# Patient Record
Sex: Male | Born: 1955 | Race: White | Hispanic: No | Smoking: Former smoker
Health system: Southern US, Community
[De-identification: ages and names within clinical notes are randomized; demographics above are authoritative.]

## PROBLEM LIST (undated history)

## (undated) DIAGNOSIS — K760 Fatty (change of) liver, not elsewhere classified: Secondary | ICD-10-CM

## (undated) DIAGNOSIS — K219 Gastro-esophageal reflux disease without esophagitis: Secondary | ICD-10-CM

## (undated) DIAGNOSIS — I495 Sick sinus syndrome: Secondary | ICD-10-CM

## (undated) DIAGNOSIS — E538 Deficiency of other specified B group vitamins: Secondary | ICD-10-CM

## (undated) DIAGNOSIS — M25569 Pain in unspecified knee: Secondary | ICD-10-CM

## (undated) DIAGNOSIS — I10 Essential (primary) hypertension: Secondary | ICD-10-CM

## (undated) DIAGNOSIS — E785 Hyperlipidemia, unspecified: Secondary | ICD-10-CM

## (undated) DIAGNOSIS — D126 Benign neoplasm of colon, unspecified: Secondary | ICD-10-CM

## (undated) DIAGNOSIS — Z87442 Personal history of urinary calculi: Secondary | ICD-10-CM

## (undated) DIAGNOSIS — K439 Ventral hernia without obstruction or gangrene: Secondary | ICD-10-CM

## (undated) DIAGNOSIS — K648 Other hemorrhoids: Secondary | ICD-10-CM

## (undated) DIAGNOSIS — Z950 Presence of cardiac pacemaker: Secondary | ICD-10-CM

## (undated) DIAGNOSIS — K5090 Crohn's disease, unspecified, without complications: Secondary | ICD-10-CM

## (undated) DIAGNOSIS — K5790 Diverticulosis of intestine, part unspecified, without perforation or abscess without bleeding: Secondary | ICD-10-CM

## (undated) DIAGNOSIS — I4430 Unspecified atrioventricular block: Secondary | ICD-10-CM

## (undated) DIAGNOSIS — E291 Testicular hypofunction: Secondary | ICD-10-CM

## (undated) DIAGNOSIS — J189 Pneumonia, unspecified organism: Secondary | ICD-10-CM

## (undated) DIAGNOSIS — M1990 Unspecified osteoarthritis, unspecified site: Secondary | ICD-10-CM

## (undated) HISTORY — PX: APPENDECTOMY: SHX54

## (undated) HISTORY — PX: CATARACT EXTRACTION: SUR2

## (undated) HISTORY — DX: Testicular hypofunction: E29.1

## (undated) HISTORY — DX: Diverticulosis of intestine, part unspecified, without perforation or abscess without bleeding: K57.90

## (undated) HISTORY — DX: Ventral hernia without obstruction or gangrene: K43.9

## (undated) HISTORY — DX: Pain in unspecified knee: M25.569

## (undated) HISTORY — DX: Benign neoplasm of colon, unspecified: D12.6

## (undated) HISTORY — DX: Deficiency of other specified B group vitamins: E53.8

## (undated) HISTORY — DX: Gastro-esophageal reflux disease without esophagitis: K21.9

## (undated) HISTORY — DX: Hyperlipidemia, unspecified: E78.5

## (undated) HISTORY — DX: Fatty (change of) liver, not elsewhere classified: K76.0

## (undated) HISTORY — DX: Other hemorrhoids: K64.8

## (undated) HISTORY — DX: Essential (primary) hypertension: I10

---

## 1994-01-08 HISTORY — PX: OTHER SURGICAL HISTORY: SHX169

## 2000-11-21 HISTORY — PX: OTHER SURGICAL HISTORY: SHX169

## 2001-08-25 ENCOUNTER — Encounter: Payer: Self-pay | Admitting: Endocrinology

## 2001-08-25 ENCOUNTER — Encounter: Admission: RE | Admit: 2001-08-25 | Discharge: 2001-08-25 | Payer: Self-pay | Admitting: Endocrinology

## 2002-10-08 ENCOUNTER — Encounter: Payer: Self-pay | Admitting: Endocrinology

## 2002-10-08 ENCOUNTER — Ambulatory Visit (HOSPITAL_COMMUNITY): Admission: RE | Admit: 2002-10-08 | Discharge: 2002-10-08 | Payer: Self-pay | Admitting: Endocrinology

## 2003-11-25 ENCOUNTER — Ambulatory Visit: Payer: Self-pay | Admitting: Endocrinology

## 2003-12-01 ENCOUNTER — Ambulatory Visit: Payer: Self-pay | Admitting: Endocrinology

## 2004-03-24 ENCOUNTER — Ambulatory Visit: Payer: Self-pay | Admitting: Endocrinology

## 2004-04-03 ENCOUNTER — Ambulatory Visit: Payer: Self-pay | Admitting: Internal Medicine

## 2004-04-05 ENCOUNTER — Ambulatory Visit (HOSPITAL_COMMUNITY): Admission: RE | Admit: 2004-04-05 | Discharge: 2004-04-05 | Payer: Self-pay | Admitting: Internal Medicine

## 2004-04-24 ENCOUNTER — Ambulatory Visit: Payer: Self-pay | Admitting: Endocrinology

## 2004-05-04 ENCOUNTER — Ambulatory Visit: Payer: Self-pay | Admitting: Endocrinology

## 2004-10-13 ENCOUNTER — Ambulatory Visit: Payer: Self-pay | Admitting: Endocrinology

## 2004-12-12 ENCOUNTER — Ambulatory Visit: Payer: Self-pay | Admitting: Endocrinology

## 2005-05-09 ENCOUNTER — Ambulatory Visit: Payer: Self-pay | Admitting: Endocrinology

## 2005-05-14 ENCOUNTER — Ambulatory Visit: Payer: Self-pay | Admitting: Endocrinology

## 2005-06-28 ENCOUNTER — Ambulatory Visit: Payer: Self-pay | Admitting: Endocrinology

## 2005-07-12 ENCOUNTER — Ambulatory Visit: Payer: Self-pay | Admitting: Endocrinology

## 2006-02-01 HISTORY — PX: ELECTROCARDIOGRAM: SHX264

## 2006-07-22 ENCOUNTER — Ambulatory Visit: Payer: Self-pay | Admitting: Endocrinology

## 2006-07-22 LAB — CONVERTED CEMR LAB
ALT: 24 units/L (ref 0–53)
AST: 28 units/L (ref 0–37)
Albumin: 3.8 g/dL (ref 3.5–5.2)
Alkaline Phosphatase: 60 units/L (ref 39–117)
BUN: 11 mg/dL (ref 6–23)
Basophils Absolute: 0.2 10*3/uL — ABNORMAL HIGH (ref 0.0–0.1)
Basophils Relative: 2.5 % — ABNORMAL HIGH (ref 0.0–1.0)
Bilirubin Urine: NEGATIVE
Bilirubin, Direct: 0.1 mg/dL (ref 0.0–0.3)
CO2: 33 meq/L — ABNORMAL HIGH (ref 19–32)
Calcium: 9.9 mg/dL (ref 8.4–10.5)
Chloride: 98 meq/L (ref 96–112)
Cholesterol: 125 mg/dL (ref 0–200)
Creatinine, Ser: 1 mg/dL (ref 0.4–1.5)
Eosinophils Absolute: 0.2 10*3/uL (ref 0.0–0.6)
Eosinophils Relative: 2.1 % (ref 0.0–5.0)
GFR calc Af Amer: 101 mL/min
GFR calc non Af Amer: 84 mL/min
Glucose, Bld: 124 mg/dL — ABNORMAL HIGH (ref 70–99)
HCT: 48.7 % (ref 39.0–52.0)
HDL: 33.5 mg/dL — ABNORMAL LOW (ref >39.0)
Hemoglobin, Urine: NEGATIVE
Hemoglobin: 16.1 g/dL (ref 13.0–17.0)
Hgb A1c MFr Bld: 5.8 % (ref 4.6–6.0)
Ketones, ur: NEGATIVE mg/dL
LDL Cholesterol: 72 mg/dL (ref 0–99)
Leukocytes, UA: NEGATIVE
Lymphocytes Relative: 22.3 % (ref 12.0–46.0)
MCHC: 33.1 g/dL (ref 30.0–36.0)
MCV: 89.2 fL (ref 78.0–100.0)
Monocytes Absolute: 0.4 10*3/uL (ref 0.2–0.7)
Monocytes Relative: 5 % (ref 3.0–11.0)
Neutro Abs: 4.8 10*3/uL (ref 1.4–7.7)
Neutrophils Relative %: 68.1 % (ref 43.0–77.0)
Nitrite: NEGATIVE
PSA: 1.93 ng/mL (ref 0.10–4.00)
Platelets: 232 10*3/uL (ref 150–400)
Potassium: 4.6 meq/L (ref 3.5–5.1)
RBC: 5.46 M/uL (ref 4.22–5.81)
RDW: 13 % (ref 11.5–14.6)
Sodium: 137 meq/L (ref 135–145)
Specific Gravity, Urine: 1.025 (ref 1.000–1.03)
TSH: 1.04 microintl units/mL (ref 0.35–5.50)
Testosterone: 422.45 ng/dL (ref 350.00–890)
Total Bilirubin: 0.8 mg/dL (ref 0.3–1.2)
Total CHOL/HDL Ratio: 3.7
Total Protein, Urine: NEGATIVE mg/dL
Total Protein: 7 g/dL (ref 6.0–8.3)
Triglycerides: 100 mg/dL (ref 0–149)
Urine Glucose: NEGATIVE mg/dL
Urobilinogen, UA: 0.2 (ref 0.0–1.0)
VLDL: 20 mg/dL (ref 0–40)
WBC: 7.2 10*3/uL (ref 4.5–10.5)
pH: 5.5 (ref 5.0–8.0)

## 2006-08-02 ENCOUNTER — Ambulatory Visit: Payer: Self-pay | Admitting: Endocrinology

## 2006-09-04 ENCOUNTER — Encounter: Payer: Self-pay | Admitting: Endocrinology

## 2006-09-04 DIAGNOSIS — K219 Gastro-esophageal reflux disease without esophagitis: Secondary | ICD-10-CM

## 2006-09-04 HISTORY — DX: Gastro-esophageal reflux disease without esophagitis: K21.9

## 2007-03-24 ENCOUNTER — Ambulatory Visit: Payer: Self-pay | Admitting: Internal Medicine

## 2007-03-24 ENCOUNTER — Ambulatory Visit: Payer: Self-pay | Admitting: Endocrinology

## 2007-03-24 DIAGNOSIS — R109 Unspecified abdominal pain: Secondary | ICD-10-CM | POA: Insufficient documentation

## 2007-03-24 DIAGNOSIS — M25569 Pain in unspecified knee: Secondary | ICD-10-CM

## 2007-03-24 HISTORY — DX: Pain in unspecified knee: M25.569

## 2007-03-25 LAB — CONVERTED CEMR LAB
BUN: 8 mg/dL (ref 6–23)
Basophils Absolute: 0.1 10*3/uL (ref 0.0–0.1)
Basophils Relative: 0.7 % (ref 0.0–1.0)
Creatinine, Ser: 0.9 mg/dL (ref 0.4–1.5)
Eosinophils Absolute: 0.1 10*3/uL (ref 0.0–0.6)
Eosinophils Relative: 1 % (ref 0.0–5.0)
HCT: 52.6 % — ABNORMAL HIGH (ref 39.0–52.0)
Hemoglobin: 17.2 g/dL — ABNORMAL HIGH (ref 13.0–17.0)
Lymphocytes Relative: 23.1 % (ref 12.0–46.0)
MCHC: 32.7 g/dL (ref 30.0–36.0)
MCV: 90.5 fL (ref 78.0–100.0)
Monocytes Absolute: 0.3 10*3/uL (ref 0.2–0.7)
Monocytes Relative: 4 % (ref 3.0–11.0)
Neutro Abs: 5.3 10*3/uL (ref 1.4–7.7)
Neutrophils Relative %: 71.2 % (ref 43.0–77.0)
Platelets: 208 10*3/uL (ref 150–400)
RBC: 5.81 M/uL (ref 4.22–5.81)
RDW: 13.8 % (ref 11.5–14.6)
Sed Rate: 3 mm/hr (ref 0–20)
Uric Acid, Serum: 6.3 mg/dL (ref 2.4–7.0)
WBC: 7.5 10*3/uL (ref 4.5–10.5)

## 2007-04-07 ENCOUNTER — Ambulatory Visit (HOSPITAL_COMMUNITY): Admission: RE | Admit: 2007-04-07 | Discharge: 2007-04-07 | Payer: Self-pay | Admitting: General Surgery

## 2007-04-07 ENCOUNTER — Encounter (INDEPENDENT_AMBULATORY_CARE_PROVIDER_SITE_OTHER): Payer: Self-pay | Admitting: General Surgery

## 2007-06-09 ENCOUNTER — Encounter: Payer: Self-pay | Admitting: Endocrinology

## 2008-02-23 ENCOUNTER — Emergency Department (HOSPITAL_COMMUNITY): Admission: EM | Admit: 2008-02-23 | Discharge: 2008-02-23 | Payer: Self-pay | Admitting: Emergency Medicine

## 2008-02-23 ENCOUNTER — Telehealth: Payer: Self-pay | Admitting: Endocrinology

## 2008-05-18 ENCOUNTER — Ambulatory Visit: Payer: Self-pay | Admitting: Endocrinology

## 2008-05-19 LAB — CONVERTED CEMR LAB
ALT: 21 units/L (ref 0–53)
Bilirubin Urine: NEGATIVE
Bilirubin, Direct: 0.1 mg/dL (ref 0.0–0.3)
Calcium: 9.9 mg/dL (ref 8.4–10.5)
Cholesterol: 290 mg/dL — ABNORMAL HIGH (ref 0–200)
Creatinine, Ser: 0.8 mg/dL (ref 0.4–1.5)
Eosinophils Absolute: 0.2 10*3/uL (ref 0.0–0.7)
Eosinophils Relative: 2.9 % (ref 0.0–5.0)
GFR calc non Af Amer: 107.48 mL/min (ref >60)
HCT: 48.1 % (ref 39.0–52.0)
HDL: 52.6 mg/dL (ref >39.00)
Ketones, ur: NEGATIVE mg/dL
Leukocytes, UA: NEGATIVE
Lymphs Abs: 1.7 10*3/uL (ref 0.7–4.0)
MCHC: 34.1 g/dL (ref 30.0–36.0)
MCV: 90.8 fL (ref 78.0–100.0)
Monocytes Absolute: 0.5 10*3/uL (ref 0.1–1.0)
Neutrophils Relative %: 58.8 % (ref 43.0–77.0)
Nitrite: NEGATIVE
PSA: 1.67 ng/mL (ref 0.10–4.00)
Platelets: 196 10*3/uL (ref 150.0–400.0)
Sodium: 139 meq/L (ref 135–145)
Specific Gravity, Urine: 1.025 (ref 1.000–1.030)
TSH: 2.63 microintl units/mL (ref 0.35–5.50)
Total Bilirubin: 0.6 mg/dL (ref 0.3–1.2)
Total Protein: 7.3 g/dL (ref 6.0–8.3)
Triglycerides: 147 mg/dL (ref 0.0–149.0)
Urobilinogen, UA: 0.2 (ref 0.0–1.0)
WBC: 5.8 10*3/uL (ref 4.5–10.5)

## 2008-05-20 ENCOUNTER — Ambulatory Visit: Payer: Self-pay | Admitting: Endocrinology

## 2008-05-20 DIAGNOSIS — E291 Testicular hypofunction: Secondary | ICD-10-CM

## 2008-05-20 HISTORY — DX: Testicular hypofunction: E29.1

## 2009-06-24 ENCOUNTER — Ambulatory Visit: Payer: Self-pay | Admitting: Endocrinology

## 2009-06-25 LAB — CONVERTED CEMR LAB
Albumin: 3.9 g/dL (ref 3.5–5.2)
Alkaline Phosphatase: 71 units/L (ref 39–117)
BUN: 13 mg/dL (ref 6–23)
Basophils Relative: 0.4 % (ref 0.0–3.0)
Bilirubin Urine: NEGATIVE
Calcium: 8.9 mg/dL (ref 8.4–10.5)
Cholesterol: 277 mg/dL — ABNORMAL HIGH (ref 0–200)
Eosinophils Absolute: 0.2 10*3/uL (ref 0.0–0.7)
GFR calc non Af Amer: 158.28 mL/min (ref >60)
Glucose, Bld: 114 mg/dL — ABNORMAL HIGH (ref 70–99)
HCT: 41.9 % (ref 39.0–52.0)
Hemoglobin, Urine: NEGATIVE
Hemoglobin: 14.4 g/dL (ref 13.0–17.0)
Lymphs Abs: 1.2 10*3/uL (ref 0.7–4.0)
MCHC: 34.4 g/dL (ref 30.0–36.0)
MCV: 89.4 fL (ref 78.0–100.0)
Monocytes Absolute: 0.3 10*3/uL (ref 0.1–1.0)
Neutro Abs: 2.7 10*3/uL (ref 1.4–7.7)
Nitrite: NEGATIVE
PSA: 1.33 ng/mL (ref 0.10–4.00)
RBC: 4.68 M/uL (ref 4.22–5.81)
TSH: 2.01 microintl units/mL (ref 0.35–5.50)
Total Protein, Urine: NEGATIVE mg/dL
Total Protein: 6.6 g/dL (ref 6.0–8.3)

## 2009-06-28 ENCOUNTER — Ambulatory Visit: Payer: Self-pay | Admitting: Endocrinology

## 2009-06-28 DIAGNOSIS — E785 Hyperlipidemia, unspecified: Secondary | ICD-10-CM

## 2009-06-28 HISTORY — DX: Hyperlipidemia, unspecified: E78.5

## 2010-02-07 NOTE — Assessment & Plan Note (Signed)
Summary: PHYSICAL-OYU   Vital Signs:  Patient profile:   55 year old male Height:      63 inches (160.02 cm) Weight:      203.50 pounds (92.50 kg) BMI:     36.18 O2 Sat:      93 % on Room air Temp:     97.4 degrees F (36.33 degrees C) oral Pulse rate:   89 / minute BP sitting:   104 / 78  (left arm) Cuff size:   regular  Vitals Entered By: Rebeca Alert (June 28, 2009 9:09 AM)  O2 Flow:  Room air CC: CPX/aj   CC:  CPX/aj.  History of Present Illness: he stopped androgel due to loss of insurance.  he feels weak since then. he takes zocor.  Current Medications (verified): 1)  Adult Aspirin Low Strength 81 Mg  Tbdp (Aspirin) .... Take 1 By Mouth Qd 2)  Testosterone Cypionate 200 Mg/ml  Oil (Testosterone Cypionate) .... 200 Mg Im Q 2 Weeks 3)  Viagra 100 Mg  Tabs (Sildenafil Citrate) .... Use Prn 4)  Simvastatin 80 Mg Tabs (Simvastatin) .... Take 1 Tablet By Mouth Once A Day 5)  Bd Luer-Lok Syringe 23g X 1-1/2" 3 Ml Misc (Syringe/needle (Disp)) .Marland Kitchen.. 1 Q2weeks  Allergies (verified): 1)  ! Mevacor  Past History:  Past Medical History: Last updated: 09/04/2006 GERD Hypertension Dyslipidemia Idiopathic 2% Hypogonadism Hyperglycemia Ventral Hernia  Review of Systems       The patient complains of weight gain.    Physical Exam  General:  obese.  no distress  Neck:  Supple without thyroid enlargement or tenderness.   Lungs:  Clear to auscultation bilaterally. Normal respiratory effort.  Heart:  Regular rate and rhythm without murmurs or gallops noted. Normal S1,S2.   Extremities:  no edema   Impression & Recommendations:  Problem # 1:  HYPOGONADISM, MALE (ICD-257.2)  Problem # 2:  DYSLIPIDEMIA (ICD-272.4) needs increased rx CHOL: 277 (06/24/2009)   LDL: 72 (07/22/2006)   HDL: 48.60 (06/24/2009)   TG: 445.0 (06/24/2009)  Medications Added to Medication List This Visit: 1)  Simvastatin 40 Mg Tabs (Simvastatin) .Marland Kitchen.. 1 at bedtime 2)  Trilipix 135 Mg Cpdr  (Choline fenofibrate) .Marland Kitchen.. 1 once daily  Other Orders: Est. Patient Level II (78242)  Patient Instructions: 1)  reduce simvastatin to 40 mg once daily 2)  add fenofibrate 135 mg once daily.  here are some samples.  call when you want a prescription 3)  in 1 month, go to lab for lipids 272.4, testosterone, 257.2, and liver 272.0 4)  return 1 year Prescriptions: BD LUER-LOK SYRINGE 23G X 1-1/2" 3 ML MISC (SYRINGE/NEEDLE (DISP)) 1 q2weeks  #10 x 3   Entered and Authorized by:   Donavan Foil MD   Signed by:   Donavan Foil MD on 06/28/2009   Method used:   Electronically to        [facility] (retail)       [address] [place], [place]  [address]       Ph: [phone] or [phone]       Fax: [phone]   RxID:   [id number] SIMVASTATIN 40 MG TABS (SIMVASTATIN) 1 at bedtime  #30 x 11   Entered and Authorized by:   Donavan Foil MD   Signed by:   Donavan Foil MD on 06/28/2009   Method  used:   Electronically to        [facility] (retail)       [address] [place], [place]  [address]       Ph: [phone] or [phone]       Fax: [phone]   RxID:   [id number] SIMVASTATIN 40 MG TABS (SIMVASTATIN) 1 at bedtime  #30 x 11   Entered and Authorized by:   Donavan Foil MD   Signed by:   Donavan Foil MD on 06/28/2009   Method used:   Print then Give to Patient   RxID:   [id number] TESTOSTERONE CYPIONATE 200 MG/ML  OIL (TESTOSTERONE CYPIONATE) 200 mg im q 2 weeks  #10 cc x 2   Entered and Authorized by:   Donavan Foil MD   Signed by:   Donavan Foil MD on 06/28/2009   Method used:   Print then Give to Patient   RxID:   [id number]

## 2010-05-04 ENCOUNTER — Other Ambulatory Visit (INDEPENDENT_AMBULATORY_CARE_PROVIDER_SITE_OTHER): Payer: Self-pay | Admitting: Endocrinology

## 2010-05-04 ENCOUNTER — Other Ambulatory Visit: Payer: Self-pay | Admitting: Endocrinology

## 2010-05-04 ENCOUNTER — Other Ambulatory Visit (INDEPENDENT_AMBULATORY_CARE_PROVIDER_SITE_OTHER): Payer: Self-pay

## 2010-05-04 DIAGNOSIS — E785 Hyperlipidemia, unspecified: Secondary | ICD-10-CM

## 2010-05-04 DIAGNOSIS — Z0000 Encounter for general adult medical examination without abnormal findings: Secondary | ICD-10-CM

## 2010-05-04 DIAGNOSIS — Z0389 Encounter for observation for other suspected diseases and conditions ruled out: Secondary | ICD-10-CM

## 2010-05-04 LAB — CBC WITH DIFFERENTIAL/PLATELET
Basophils Absolute: 0 10*3/uL (ref 0.0–0.1)
Basophils Relative: 0.3 % (ref 0.0–3.0)
Eosinophils Absolute: 0.1 10*3/uL (ref 0.0–0.7)
Eosinophils Relative: 2.3 % (ref 0.0–5.0)
HCT: 39.6 % (ref 39.0–52.0)
Hemoglobin: 13.6 g/dL (ref 13.0–17.0)
Lymphocytes Relative: 27.2 % (ref 12.0–46.0)
Lymphs Abs: 1.3 10*3/uL (ref 0.7–4.0)
MCHC: 34.4 g/dL (ref 30.0–36.0)
MCV: 90.2 fl (ref 78.0–100.0)
Monocytes Absolute: 0.3 10*3/uL (ref 0.1–1.0)
Monocytes Relative: 6.2 % (ref 3.0–12.0)
Neutro Abs: 3 10*3/uL (ref 1.4–7.7)
Neutrophils Relative %: 64 % (ref 43.0–77.0)
Platelets: 185 10*3/uL (ref 150.0–400.0)
RBC: 4.38 Mil/uL (ref 4.22–5.81)
RDW: 13.3 % (ref 11.5–14.6)
WBC: 4.6 10*3/uL (ref 4.5–10.5)

## 2010-05-04 LAB — BASIC METABOLIC PANEL
CO2: 28 mEq/L (ref 19–32)
Calcium: 9.1 mg/dL (ref 8.4–10.5)
Creatinine, Ser: 0.5 mg/dL (ref 0.4–1.5)
GFR: 167.93 mL/min (ref >60.00)
Glucose, Bld: 94 mg/dL (ref 70–99)
Sodium: 137 mEq/L (ref 135–145)

## 2010-05-04 LAB — URINALYSIS
Hgb urine dipstick: NEGATIVE
Nitrite: NEGATIVE
Specific Gravity, Urine: 1.025 (ref 1.000–1.030)
Total Protein, Urine: NEGATIVE
Urine Glucose: NEGATIVE
pH: 6 (ref 5.0–8.0)

## 2010-05-04 LAB — PSA: PSA: 0.6 ng/mL (ref 0.10–4.00)

## 2010-05-04 LAB — HEPATIC FUNCTION PANEL
ALT: 27 U/L (ref 0–53)
AST: 27 U/L (ref 0–37)
Albumin: 3.8 g/dL (ref 3.5–5.2)
Alkaline Phosphatase: 71 U/L (ref 39–117)
Bilirubin, Direct: 0 mg/dL (ref 0.0–0.3)
Total Bilirubin: 0.5 mg/dL (ref 0.3–1.2)
Total Protein: 6.8 g/dL (ref 6.0–8.3)

## 2010-05-04 LAB — LIPID PANEL
Cholesterol: 182 mg/dL (ref 0–200)
HDL: 51.4 mg/dL (ref >39.00)
Total CHOL/HDL Ratio: 4
Triglycerides: 204 mg/dL — ABNORMAL HIGH (ref 0.0–149.0)
VLDL: 40.8 mg/dL — ABNORMAL HIGH (ref 0.0–40.0)

## 2010-05-04 LAB — TSH: TSH: 2.26 u[IU]/mL (ref 0.35–5.50)

## 2010-05-09 ENCOUNTER — Ambulatory Visit (INDEPENDENT_AMBULATORY_CARE_PROVIDER_SITE_OTHER): Payer: Self-pay | Admitting: Endocrinology

## 2010-05-09 ENCOUNTER — Encounter: Payer: Self-pay | Admitting: Endocrinology

## 2010-05-09 VITALS — BP 100/72 | HR 106 | Temp 99.5°F | Resp 16 | Ht 63.0 in | Wt 201.0 lb

## 2010-05-09 DIAGNOSIS — E291 Testicular hypofunction: Secondary | ICD-10-CM

## 2010-05-09 DIAGNOSIS — Z136 Encounter for screening for cardiovascular disorders: Secondary | ICD-10-CM

## 2010-05-09 MED ORDER — TESTOSTERONE CYPIONATE 200 MG/ML IM SOLN: 200.0000 mg | INTRAMUSCULAR | Status: DC

## 2010-05-09 MED ORDER — SIMVASTATIN 80 MG PO TABS: 80.0000 mg | ORAL_TABLET | Freq: Every day | ORAL | Status: DC

## 2010-05-09 MED ORDER — SILDENAFIL CITRATE 100 MG PO TABS: 100.0000 mg | ORAL_TABLET | ORAL | Status: DC | PRN

## 2010-05-09 NOTE — Patient Instructions (Addendum)
Resume testosterone injections.  Here is a prescription.  Please got to lab in approx 2 months for a blood test.  then please call [phone] to hear your test results. You should call [phone], to a pharmacy in [place], which sells viagra at $65 for 20 pills.  Here is a prescription.   Please return in 1 year.

## 2010-05-09 NOTE — Progress Notes (Signed)
  Subjective:    Patient ID: Maxwell Li, male    DOB: 03-25-1955, 55 y.o.   MRN: [id number]  HPI The state of at least three ongoing medical problems is addressed today: Pt took last testosterone injection approx 4 mos ago.  Since then, he has decreased body hair.  Pt has h/o hyperglycemia. He takes zocor as rx'ed.   Review of Systems Denies loc and chest pain    Objective:   Physical Exam GENERAL: no distress.  Obese Neck - No masses or thyromegaly or limitation in range of motion LUNGS:  Clear to auscultation HEART:  Regular rate and rhythm without murmurs noted. Normal S1,S2.   GENITALIA:  Normal scrotum and penis.  Testes are mall and soft Ext:  No edema Skin: there is decreased body hair    Lab Results  Component Value Date   WBC 4.6 05/04/2010   HGB 13.6 05/04/2010   HCT 39.6 05/04/2010   PLT 185.0 05/04/2010   CHOL 182 05/04/2010   TRIG 204.0* 05/04/2010   HDL 51.40 05/04/2010   LDLDIRECT 93.9 05/04/2010   ALT 27 05/04/2010   AST 27 05/04/2010   NA 137 05/04/2010   K 4.7 05/04/2010   CL 100 05/04/2010   CREATININE 0.5 05/04/2010   BUN 13 05/04/2010   CO2 28 05/04/2010   TSH 2.26 05/04/2010   PSA 0.60 05/04/2010   HGBA1C 5.8 07/22/2006      Assessment & Plan:  Hyperglycemia,stable. Dyslipidemia, well-controlled Hypogonadism.  He needs to resume rx.

## 2010-05-23 NOTE — Op Note (Signed)
[name], Li           ACCOUNT NO.:  [id number]   MEDICAL RECORD NO.:  [id number]          PATIENT TYPE:  AMB   LOCATION:  DAY                          FACILITY:  [facility]   PHYSICIAN:  Orson Ape. Weatherly, M.D.DATE OF BIRTH:  01-03-1956   DATE OF PROCEDURE:  DATE OF DISCHARGE:                               OPERATIVE REPORT   PREOPERATIVE DIAGNOSIS:  Small umbilical hernia.   POSTOPERATIVE DIAGNOSIS:  Small umbilical hernia.   OPERATION:  Repair of incarcerated umbilical hernia.   General anesthesia.   SURGEON:  Orson Ape. Rise Patience, M.D.   HISTORY:  Maxwell Li is a 55 year old kind of short, slightly  overweight male who I first saw approximately a week ago when he had the  sudden onset of pain in the umbilicus.  He could feel a bulge and was  seen by his regular physician, Dr. Renato Shin, who had also scheduled  a CT at the cardiologist's, and I saw the patient.  He had a small,  tender bulge that he said he had been bigger originally and the area had  no signs of any bowel obstruction.  He then went on and got the CT,  which had shown preperitoneal fat sort of incarcerated above the fascia  at the umbilicus.  I talked with Dr. Marlou Starks, who was on-call, and he said  since the patient was not having any bowel obstruction symptoms, etc.,  he thought it would be best just to get the patient done electively and  he is here today for the planned procedure.  On examination, he about a  pecan-size bulge right above the umbilicus that you cannot reduce.  It  is not as tender as it was when I saw him earlier, and the area is  otherwise in good health.  His electrolytes and white count were normal  and we gave him a gram of Ancef and took him back to the operative  suite.   The patient was intubated and the area shaved around the umbilicus and  then prepped with Betadine solution.  He had been given a gram of Ancef.  I made a little vertical incision above the  umbilicus and then separated  the fatty tissue that was preperitoneal incarcerated in the hernia sac  above the fascia and opened the actual hernia sac, and most of this is  all preperitoneal fatty tissue, and the little pedicles were under-  clamped with Kellys and then these were tied with 2-0 Vicryl.  I then  amputated this and then on looking at the fascia, the defect is about  size your little finger nail, and I elected to close it transversely  with interrupted Surgilon sutures and did not think a piece of mesh was  needed since the fascia around it appears to be pretty strong in the  little area that goes to the umbilicus even though it was actually  located above the umbilicus.  I separated the umbilical area  circumferentially and then after this was freed you could then put two  Sims retractors in the fascia, so I could look and see all the little  peritoneal edges and all good hemostasis, and then I elected to close it  with 5-0 Surgilon sutures.  The two lateral were oriented in the fascia,  three actually to close the little defect.  I then placed about 10 mL of  Marcaine with adrenalin in the tissue for immediate postoperative pain  and then closed the subcutaneous tissue with 3-0 Vicryl, 4-0 Vicryl  subcuticular and Benzoin and Steri-Strips on the skin.  The patient  tolerated the procedure nicely and I think he will be released after a  short stay in the recovery room.  He should not have a significant  amount of pain postoperatively.  We will use Vicodin.  I will see him back in the office in approximately  7-10 days for a postop visit.  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           ______________________________  Orson Ape. Rise Patience, M.D.     WJW/MEDQ  D:  04/07/2007  T:  04/07/2007  Job:  [id number]

## 2010-10-02 LAB — CBC
HCT: 58.3 — ABNORMAL HIGH
Hemoglobin: 17.7 — ABNORMAL HIGH
MCHC: 30.4
MCV: 88.2
RBC: 6.61 — ABNORMAL HIGH

## 2010-10-02 LAB — DIFFERENTIAL
Basophils Absolute: 0
Basophils Relative: 0
Eosinophils Absolute: 0.1
Eosinophils Relative: 1
Monocytes Absolute: 0.4

## 2010-10-02 LAB — COMPREHENSIVE METABOLIC PANEL
ALT: 13
CO2: 27
Calcium: 9.3
GFR calc non Af Amer: >60
Glucose, Bld: 105 — ABNORMAL HIGH
Sodium: 135

## 2011-05-23 ENCOUNTER — Telehealth: Payer: Self-pay | Admitting: *Deleted

## 2011-05-23 DIAGNOSIS — Z0000 Encounter for general adult medical examination without abnormal findings: Secondary | ICD-10-CM

## 2011-05-23 DIAGNOSIS — Z0389 Encounter for observation for other suspected diseases and conditions ruled out: Secondary | ICD-10-CM

## 2011-05-23 NOTE — Telephone Encounter (Signed)
Message copied by Legrand Como on Wed May 23, 2011 12:23 PM ------      Message from: COUSIN, SHARON T      Created: Wed May 23, 2011  8:33 AM      Regarding: PHY DATE  06/08/11       THANKS

## 2011-05-24 NOTE — Telephone Encounter (Signed)
Labs entered into Epic for upcoming CPX appointment.

## 2011-06-05 ENCOUNTER — Other Ambulatory Visit (INDEPENDENT_AMBULATORY_CARE_PROVIDER_SITE_OTHER): Payer: Self-pay

## 2011-06-05 DIAGNOSIS — Z0389 Encounter for observation for other suspected diseases and conditions ruled out: Secondary | ICD-10-CM

## 2011-06-05 DIAGNOSIS — Z0000 Encounter for general adult medical examination without abnormal findings: Secondary | ICD-10-CM

## 2011-06-05 LAB — URINALYSIS, ROUTINE W REFLEX MICROSCOPIC
Bilirubin Urine: NEGATIVE
Hgb urine dipstick: NEGATIVE
Leukocytes, UA: NEGATIVE
Nitrite: NEGATIVE
Urobilinogen, UA: 0.2 (ref 0.0–1.0)

## 2011-06-05 LAB — CBC WITH DIFFERENTIAL/PLATELET
Basophils Absolute: 0 10*3/uL (ref 0.0–0.1)
Eosinophils Absolute: 0.1 10*3/uL (ref 0.0–0.7)
Hemoglobin: 14.2 g/dL (ref 13.0–17.0)
Lymphocytes Relative: 33.6 % (ref 12.0–46.0)
MCHC: 33.3 g/dL (ref 30.0–36.0)
Neutro Abs: 2.9 10*3/uL (ref 1.4–7.7)
Neutrophils Relative %: 56.5 % (ref 43.0–77.0)
RDW: 13.4 % (ref 11.5–14.6)

## 2011-06-05 LAB — LIPID PANEL
HDL: 55.8 mg/dL (ref >39.00)
Total CHOL/HDL Ratio: 5
Triglycerides: 289 mg/dL — ABNORMAL HIGH (ref 0.0–149.0)
VLDL: 57.8 mg/dL — ABNORMAL HIGH (ref 0.0–40.0)

## 2011-06-05 LAB — BASIC METABOLIC PANEL
Chloride: 99 mEq/L (ref 96–112)
Creatinine, Ser: 0.6 mg/dL (ref 0.4–1.5)

## 2011-06-05 LAB — HEPATIC FUNCTION PANEL
ALT: 29 U/L (ref 0–53)
Bilirubin, Direct: 0.1 mg/dL (ref 0.0–0.3)
Total Bilirubin: 0.6 mg/dL (ref 0.3–1.2)

## 2011-06-08 ENCOUNTER — Ambulatory Visit (INDEPENDENT_AMBULATORY_CARE_PROVIDER_SITE_OTHER): Payer: Self-pay | Admitting: Endocrinology

## 2011-06-08 ENCOUNTER — Encounter: Payer: Self-pay | Admitting: Endocrinology

## 2011-06-08 VITALS — BP 118/78 | HR 87 | Temp 97.3°F | Ht 63.0 in | Wt 206.0 lb

## 2011-06-08 DIAGNOSIS — E291 Testicular hypofunction: Secondary | ICD-10-CM

## 2011-06-08 DIAGNOSIS — E785 Hyperlipidemia, unspecified: Secondary | ICD-10-CM

## 2011-06-08 MED ORDER — ATORVASTATIN CALCIUM 80 MG PO TABS: ORAL_TABLET | ORAL | Status: DC

## 2011-06-08 MED ORDER — FENOFIBRATE 150 MG PO CAPS: ORAL_CAPSULE | ORAL | Status: DC

## 2011-06-08 MED ORDER — SILDENAFIL CITRATE 100 MG PO TABS: 100.0000 mg | ORAL_TABLET | ORAL | Status: DC | PRN

## 2011-06-08 MED ORDER — TESTOSTERONE CYPIONATE 200 MG/ML IM SOLN: 200.0000 mg | INTRAMUSCULAR | Status: DC

## 2011-06-08 NOTE — Patient Instructions (Addendum)
Resume fenofibrate. Take lipitor instead of zocor.  i have sent prescriptions to your pharmacy. Go to lab in 2 months.  blood tests are being requested for you today.  You will receive a letter with results.  Please return in 1 year. please consider these measures for your health:  minimize alcohol.  do not use tobacco products.  have a colonoscopy at least every 10 years from age 56.  keep firearms safely stored.  always use seat belts.  have working smoke alarms in your home.  see an eye doctor and dentist regularly.  never drive under the influence of alcohol or drugs (including prescription drugs).  those with fair skin should take precautions against the sun.

## 2011-06-08 NOTE — Progress Notes (Signed)
Subjective:    Patient ID: Maxwell Li, male    DOB: 04/02/1955, 56 y.o.   MRN: [id number]  HPI The state of at least three ongoing medical problems is addressed today: Pt says he does not take zocor or fenofibrate as rx'ed.  Pt says he is having trouble getting testosterone filled.  He says his body hair decreases when he misses injections.   Pt reports weight gain. Past Medical History  Diagnosis Date  . DYSLIPIDEMIA 06/28/2009    Qualifier: Diagnosis of  By: Loanne Drilling MD, Jacelyn Pi   . GERD 09/04/2006    Qualifier: Diagnosis of  By: Loanne Drilling MD, Jacelyn Pi   . HYPOGONADISM, MALE 05/20/2008    Qualifier: Diagnosis of  By: Loanne Drilling MD, Jacelyn Pi KNEE PAIN 03/24/2007    Qualifier: Diagnosis of  By: Loanne Drilling MD, Jacelyn Pi Ventral hernia   . Hyperglycemia   . Hypertension     Past Surgical History  Procedure Date  . Electrocardiogram 02/01/2006  . Rest cardiolite 11/21/2000  . 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 Marital Status: Divorced    Spouse Name: N/A    Number of Children: N/A  . Years of Education: N/A   Occupational History  . Pastor    Social History Main Topics  . Smoking status: Former Research scientist (life sciences)  . Smokeless tobacco: Not on file  . Alcohol Use: Not on file  . Drug Use: Not on file  . Sexually Active: Not on file   Other Topics Concern  . Not on file   Social History Narrative  . No narrative on file    Current Outpatient Prescriptions on File Prior to Visit  Medication Sig Dispense Refill  . aspirin 81 MG tablet Take 81 mg by mouth daily.        Marland Kitchen testosterone cypionate (DEPOTESTOTERONE CYPIONATE) 200 MG/ML injection Inject 200 mg into the muscle every 14 (fourteen) days.      Marland Kitchen atorvastatin (LIPITOR) 80 MG tablet 1/4 tab daily  30 tablet  11  . Fenofibrate 150 MG CAPS 1/2 tab daily  30 each  11  . sildenafil (VIAGRA) 100 MG tablet Take 1 tablet (100 mg total) by mouth as needed for erectile dysfunction.  20 tablet  3     Allergies  Allergen Reactions  . Lovastatin     REACTION: Nausea    Family History  Problem Relation Age of Onset  . Cancer Brother     Lung Cancer    BP 118/78  Pulse 87  Temp(Src) 97.3 F (36.3 C) (Oral)  Ht 5' 3"  (1.6 m)  Wt 206 lb (93.441 kg)  BMI 36.49 kg/m2  SpO2 97%    Review of Systems  Respiratory: Negative for shortness of breath.   Cardiovascular: Negative for chest pain.  Genitourinary: Negative for difficulty urinating.      Objective:   Physical Exam VS: see vs page GEN: no distress HEAD: head: no deformity eyes: no periorbital swelling, no proptosis external nose and ears are normal mouth: no lesion seen NECK: supple, thyroid is not enlarged CHEST WALL: no deformity LUNGS:  Clear to auscultation CV: reg rate and rhythm, no murmur MUSCULOSKELETAL: muscle bulk and strength are grossly normal.  no obvious joint swelling.  gait is normal and steady EXTEMITIES: no deformity.  no ulcer on the feet.  feet are of normal color and temp.  no edema PULSES: dorsalis  pedis intact bilat.  no carotid bruit NEURO:  cn 2-12 grossly intact.   readily moves all 4's.  sensation is intact to touch on the feet SKIN:  Normal texture and temperature.  No rash or suspicious lesion is visible.  Diffusely decreased body hair. NODES:  None palpable at the neck PSYCH: alert, oriented x3.  Does not appear anxious nor depressed.     Lab Results  Component Value Date   WBC 5.2 06/05/2011   HGB 14.2 06/05/2011   HCT 42.6 06/05/2011   PLT 212.0 06/05/2011   GLUCOSE 102* 06/05/2011   CHOL 263* 06/05/2011   TRIG 289.0* 06/05/2011   HDL 55.80 06/05/2011   LDLDIRECT 155.6 06/05/2011   LDLCALC 72 07/22/2006   ALT 29 06/05/2011   AST 23 06/05/2011   NA 137 06/05/2011   K 4.9 06/05/2011   CL 99 06/05/2011   CREATININE 0.6 06/05/2011   BUN 12 06/05/2011   CO2 28 06/05/2011   TSH 1.44 06/05/2011   PSA 0.57 06/05/2011   HGBA1C 5.8 07/22/2006      Assessment & Plan:  Dyslipidemia.   therapy limited by noncompliance.  i'll do the best i can. Hypogonadism.  needs increased rx Weight gain.  This worsens med probs.

## 2012-06-24 ENCOUNTER — Ambulatory Visit (INDEPENDENT_AMBULATORY_CARE_PROVIDER_SITE_OTHER): Payer: Self-pay | Admitting: Endocrinology

## 2012-06-24 ENCOUNTER — Encounter: Payer: Self-pay | Admitting: Endocrinology

## 2012-06-24 ENCOUNTER — Other Ambulatory Visit: Payer: Self-pay

## 2012-06-24 VITALS — BP 136/72 | HR 78 | Ht 64.0 in | Wt 201.0 lb

## 2012-06-24 DIAGNOSIS — Z125 Encounter for screening for malignant neoplasm of prostate: Secondary | ICD-10-CM | POA: Insufficient documentation

## 2012-06-24 DIAGNOSIS — E785 Hyperlipidemia, unspecified: Secondary | ICD-10-CM

## 2012-06-24 DIAGNOSIS — Z0000 Encounter for general adult medical examination without abnormal findings: Secondary | ICD-10-CM

## 2012-06-24 DIAGNOSIS — Z79899 Other long term (current) drug therapy: Secondary | ICD-10-CM | POA: Insufficient documentation

## 2012-06-24 DIAGNOSIS — R7989 Other specified abnormal findings of blood chemistry: Secondary | ICD-10-CM | POA: Insufficient documentation

## 2012-06-24 LAB — CBC WITH DIFFERENTIAL/PLATELET
Eosinophils Relative: 2.1 % (ref 0.0–5.0)
HCT: 44.9 % (ref 39.0–52.0)
Hemoglobin: 15.1 g/dL (ref 13.0–17.0)
Lymphocytes Relative: 27.9 % (ref 12.0–46.0)
Lymphs Abs: 1.5 10*3/uL (ref 0.7–4.0)
Monocytes Relative: 5.8 % (ref 3.0–12.0)
Neutro Abs: 3.5 10*3/uL (ref 1.4–7.7)
Platelets: 217 10*3/uL (ref 150.0–400.0)
WBC: 5.5 10*3/uL (ref 4.5–10.5)

## 2012-06-24 LAB — BASIC METABOLIC PANEL
BUN: 11 mg/dL (ref 6–23)
CO2: 27 mEq/L (ref 19–32)
Chloride: 101 mEq/L (ref 96–112)
Creatinine, Ser: 0.6 mg/dL (ref 0.4–1.5)

## 2012-06-24 LAB — LIPID PANEL: Total CHOL/HDL Ratio: 5

## 2012-06-24 MED ORDER — TESTOSTERONE CYPIONATE 200 MG/ML IM SOLN: 200.0000 mg | INTRAMUSCULAR | Status: DC

## 2012-06-24 MED ORDER — SILDENAFIL CITRATE 100 MG PO TABS: 100.0000 mg | ORAL_TABLET | ORAL | Status: DC | PRN

## 2012-06-24 MED ORDER — TRIAMCINOLONE ACETONIDE 0.1 % EX CREA: TOPICAL_CREAM | Freq: Three times a day (TID) | CUTANEOUS | Status: DC

## 2012-06-24 NOTE — Progress Notes (Signed)
Subjective:    Patient ID: Maxwell Li, male    DOB: 1955/08/18, 57 y.o.   MRN: [id number]  HPI Pt states few days of moderate itching at the forearms.  No assoc rash. Last testosterone injection was 1 month ago.   He denies weight change.  Past Medical History  Diagnosis Date  . DYSLIPIDEMIA 06/28/2009    Qualifier: Diagnosis of  By: Loanne Drilling MD, Jacelyn Pi   . GERD 09/04/2006    Qualifier: Diagnosis of  By: Loanne Drilling MD, Jacelyn Pi   . HYPOGONADISM, MALE 05/20/2008    Qualifier: Diagnosis of  By: Loanne Drilling MD, Jacelyn Pi KNEE PAIN 03/24/2007    Qualifier: Diagnosis of  By: Loanne Drilling MD, Jacelyn Pi Ventral hernia   . Hyperglycemia   . Hypertension     Past Surgical History  Procedure Laterality Date  . Electrocardiogram  02/01/2006  . Rest cardiolite  11/21/2000  . Mastoid tumor resect  1996    Left    History   Social History  . Marital Status: Divorced    Spouse Name: N/A    Number of Children: N/A  . Years of Education: N/A   Occupational History  . Pastor    Social History Main Topics  . Smoking status: Former Research scientist (life sciences)  . Smokeless tobacco: Not on file  . Alcohol Use: Not on file  . Drug Use: Not on file  . Sexually Active: Not on file   Other Topics Concern  . Not on file   Social History Narrative  . No narrative on file    Current Outpatient Prescriptions on File Prior to Visit  Medication Sig Dispense Refill  . aspirin 81 MG tablet Take 81 mg by mouth daily.        . Fenofibrate 150 MG CAPS 1/2 tab daily  30 each  11   No current facility-administered medications on file prior to visit.    Allergies  Allergen Reactions  . Lovastatin     REACTION: Nausea    Family History  Problem Relation Age of Onset  . Cancer Brother     Lung Cancer    BP 136/72  Pulse 78  Ht 5' 4"  (1.626 m)  Wt 201 lb (91.173 kg)  BMI 34.48 kg/m2  SpO2 98%  Review of Systems Denies chest pain and sob.    Objective:   Physical Exam VS: see vs page GEN: no  distress HEAD: head: no deformity eyes: no periorbital swelling, no proptosis external nose and ears are normal mouth: no lesion seen NECK: supple, thyroid is not enlarged CHEST WALL: no deformity LUNGS: clear to auscultation BREASTS:  No gynecomastia CV: reg rate and rhythm, no murmur ABD: abdomen is soft, nontender.  no hepatosplenomegaly.  not distended.  no hernia GENITALIA:  Normal male, except testes are small and soft.   MUSCULOSKELETAL: muscle bulk and strength are grossly normal.  no obvious joint swelling.  gait is normal and steady EXTEMITIES: no deformity.  no ulcer on the feet.  feet are of normal color and temp.  no edema.   PULSES: dorsalis pedis intact bilat.  no carotid bruit NEURO:  cn 2-12 grossly intact.   readily moves all 4's.  sensation is intact to touch on the feet.   SKIN:  Normal texture and temperature.  No rash or suspicious lesion is visible.   NODES:  None palpable at the neck.   PSYCH: alert, oriented x3.  Does not appear anxious nor  depressed.  Lab Results  Component Value Date   WBC 5.5 06/24/2012   HGB 15.1 06/24/2012   HCT 44.9 06/24/2012   PLT 217.0 06/24/2012   GLUCOSE 107* 06/24/2012   CHOL 198 06/24/2012   TRIG 271.0* 06/24/2012   HDL 38.20* 06/24/2012   LDLDIRECT 107.9 06/24/2012   LDLCALC 72 07/22/2006   ALT 29 06/05/2011   AST 23 06/05/2011   NA 136 06/24/2012   K 4.2 06/24/2012   CL 101 06/24/2012   CREATININE 0.6 06/24/2012   BUN 11 06/24/2012   CO2 27 06/24/2012   TSH 1.64 06/24/2012   PSA 1.45 06/24/2012   HGBA1C 5.8 07/22/2006      Assessment & Plan:  Dyslipidemia: he needs increased rx Itching, new, uncertain etiology Hypogonadism, therapy limited by noncompliance.  i'll do the best i can.  We can't assess testosterone level, given the long interval since last injection.

## 2012-06-24 NOTE — Patient Instructions (Addendum)
please consider these measures for your health:  minimize alcohol.  do not use tobacco products.  have a colonoscopy at least every 10 years from age 57.  keep firearms safely stored.  always use seat belts.  have working smoke alarms in your home.  see an eye doctor and dentist regularly.  never drive under the influence of alcohol or drugs (including prescription drugs).  those with fair skin should take precautions against the sun. I realize you do not have health insurance, but there are many services you need for your best health.  These include screening tests for colon, and/or prostate; electrocardiogram, and others.  Please let me know if you want to get these important tests done.   blood tests are being requested for you today.  We'll contact you with results.   i have sent a prescription to your pharmacy, for a skin cream. here are some tests for blood in the bowels.  please follow the instructions, and return to the lab downstairs

## 2012-06-25 LAB — PSA: PSA: 1.45 ng/mL (ref 0.10–4.00)

## 2012-06-27 ENCOUNTER — Encounter: Payer: Self-pay | Admitting: Endocrinology

## 2012-06-30 ENCOUNTER — Other Ambulatory Visit: Payer: Self-pay | Admitting: Endocrinology

## 2012-06-30 ENCOUNTER — Encounter: Payer: Self-pay | Admitting: Endocrinology

## 2012-06-30 MED ORDER — HALOBETASOL PROPIONATE 0.05 % EX CREA: TOPICAL_CREAM | Freq: Three times a day (TID) | CUTANEOUS | Status: DC | PRN

## 2012-07-01 ENCOUNTER — Other Ambulatory Visit: Payer: Self-pay | Admitting: Endocrinology

## 2012-07-01 ENCOUNTER — Encounter: Payer: Self-pay | Admitting: Endocrinology

## 2012-07-01 MED ORDER — METHYLPREDNISOLONE 4 MG PO KIT: PACK | ORAL | Status: DC

## 2012-07-28 ENCOUNTER — Other Ambulatory Visit: Payer: Self-pay | Admitting: Endocrinology

## 2012-07-28 ENCOUNTER — Encounter: Payer: Self-pay | Admitting: Endocrinology

## 2012-07-28 MED ORDER — TRIAMCINOLONE ACETONIDE 0.1 % EX CREA: TOPICAL_CREAM | Freq: Three times a day (TID) | CUTANEOUS | Status: DC

## 2012-11-13 ENCOUNTER — Other Ambulatory Visit: Payer: Self-pay

## 2013-04-28 ENCOUNTER — Other Ambulatory Visit: Payer: Self-pay | Admitting: Endocrinology

## 2013-04-28 MED ORDER — TESTOSTERONE CYPIONATE 200 MG/ML IM SOLN: 200.0000 mg | INTRAMUSCULAR | Status: AC

## 2013-04-28 NOTE — Addendum Note (Signed)
Addended by: Renato Shin on: 04/28/2013 12:06 PM   Modules accepted: Orders

## 2013-04-28 NOTE — Telephone Encounter (Signed)
Rx faxed to pharmacy  

## 2013-04-28 NOTE — Telephone Encounter (Signed)
i printed 

## 2015-06-08 ENCOUNTER — Emergency Department (HOSPITAL_COMMUNITY): Payer: BLUE CROSS/BLUE SHIELD

## 2015-06-08 ENCOUNTER — Inpatient Hospital Stay (HOSPITAL_COMMUNITY)
Admission: EM | Admit: 2015-06-08 | Discharge: 2015-06-10 | DRG: 244 | Disposition: A | Discharge status: Home / Self Care | Payer: BLUE CROSS/BLUE SHIELD | Attending: Cardiovascular Disease | Admitting: Cardiovascular Disease

## 2015-06-08 ENCOUNTER — Encounter (HOSPITAL_COMMUNITY): Payer: Self-pay | Admitting: *Deleted

## 2015-06-08 DIAGNOSIS — Z87891 Personal history of nicotine dependence: Secondary | ICD-10-CM | POA: Diagnosis not present

## 2015-06-08 DIAGNOSIS — I495 Sick sinus syndrome: Secondary | ICD-10-CM | POA: Diagnosis present

## 2015-06-08 DIAGNOSIS — Z95818 Presence of other cardiac implants and grafts: Secondary | ICD-10-CM

## 2015-06-08 DIAGNOSIS — Z91013 Allergy to seafood: Secondary | ICD-10-CM

## 2015-06-08 DIAGNOSIS — I455 Other specified heart block: Secondary | ICD-10-CM | POA: Diagnosis not present

## 2015-06-08 DIAGNOSIS — R42 Dizziness and giddiness: Secondary | ICD-10-CM

## 2015-06-08 DIAGNOSIS — Z79899 Other long term (current) drug therapy: Secondary | ICD-10-CM

## 2015-06-08 DIAGNOSIS — E785 Hyperlipidemia, unspecified: Secondary | ICD-10-CM

## 2015-06-08 DIAGNOSIS — Z884 Allergy status to anesthetic agent status: Secondary | ICD-10-CM

## 2015-06-08 DIAGNOSIS — Z888 Allergy status to other drugs, medicaments and biological substances status: Secondary | ICD-10-CM

## 2015-06-08 DIAGNOSIS — E669 Obesity, unspecified: Secondary | ICD-10-CM | POA: Diagnosis present

## 2015-06-08 DIAGNOSIS — E291 Testicular hypofunction: Secondary | ICD-10-CM | POA: Diagnosis present

## 2015-06-08 DIAGNOSIS — R55 Syncope and collapse: Secondary | ICD-10-CM | POA: Diagnosis not present

## 2015-06-08 DIAGNOSIS — Z801 Family history of malignant neoplasm of trachea, bronchus and lung: Secondary | ICD-10-CM

## 2015-06-08 DIAGNOSIS — I442 Atrioventricular block, complete: Secondary | ICD-10-CM | POA: Diagnosis not present

## 2015-06-08 DIAGNOSIS — Z6834 Body mass index (BMI) 34.0-34.9, adult: Secondary | ICD-10-CM

## 2015-06-08 DIAGNOSIS — I10 Essential (primary) hypertension: Secondary | ICD-10-CM | POA: Diagnosis present

## 2015-06-08 DIAGNOSIS — Z7952 Long term (current) use of systemic steroids: Secondary | ICD-10-CM

## 2015-06-08 DIAGNOSIS — R001 Bradycardia, unspecified: Secondary | ICD-10-CM | POA: Diagnosis not present

## 2015-06-08 LAB — CBC WITH DIFFERENTIAL/PLATELET
BASOS ABS: 0 10*3/uL (ref 0.0–0.1)
Basophils Relative: 0 %
Eosinophils Absolute: 0.1 10*3/uL (ref 0.0–0.7)
Eosinophils Relative: 2 %
HEMATOCRIT: 41.1 % (ref 39.0–52.0)
Hemoglobin: 13.4 g/dL (ref 13.0–17.0)
LYMPHS PCT: 26 %
Lymphs Abs: 1.2 10*3/uL (ref 0.7–4.0)
MCH: 29.4 pg (ref 26.0–34.0)
MCHC: 32.6 g/dL (ref 30.0–36.0)
MCV: 90.1 fL (ref 78.0–100.0)
Monocytes Absolute: 0.4 10*3/uL (ref 0.1–1.0)
Monocytes Relative: 8 %
NEUTROS ABS: 2.9 10*3/uL (ref 1.7–7.7)
Neutrophils Relative %: 64 %
PLATELETS: 225 10*3/uL (ref 150–400)
RBC: 4.56 MIL/uL (ref 4.22–5.81)
RDW: 16.3 % — ABNORMAL HIGH (ref 11.5–15.5)
WBC: 4.6 10*3/uL (ref 4.0–10.5)

## 2015-06-08 LAB — COMPREHENSIVE METABOLIC PANEL
ALBUMIN: 3.3 g/dL — AB (ref 3.5–5.0)
ALK PHOS: 61 U/L (ref 38–126)
ALT: 16 U/L — AB (ref 17–63)
ANION GAP: 8 (ref 5–15)
AST: 22 U/L (ref 15–41)
BUN: 7 mg/dL (ref 6–20)
CALCIUM: 8.7 mg/dL — AB (ref 8.9–10.3)
CHLORIDE: 98 mmol/L — AB (ref 101–111)
CO2: 26 mmol/L (ref 22–32)
Creatinine, Ser: 0.75 mg/dL (ref 0.61–1.24)
GFR calc Af Amer: >60 mL/min (ref >60)
GFR calc non Af Amer: >60 mL/min (ref >60)
GLUCOSE: 120 mg/dL — AB (ref 65–99)
Potassium: 3.9 mmol/L (ref 3.5–5.1)
SODIUM: 132 mmol/L — AB (ref 135–145)
Total Bilirubin: 0.4 mg/dL (ref 0.3–1.2)
Total Protein: 6.1 g/dL — ABNORMAL LOW (ref 6.5–8.1)

## 2015-06-08 LAB — I-STAT TROPONIN, ED: Troponin i, poc: 0 ng/mL (ref 0.00–0.08)

## 2015-06-08 MED ORDER — ONDANSETRON HCL 4 MG/2ML IJ SOLN: 4.0000 mg | Freq: Four times a day (QID) | INTRAMUSCULAR | Status: DC | PRN

## 2015-06-08 MED ORDER — ATROPINE SULFATE 1 MG/ML IJ SOLN
INTRAMUSCULAR | Status: AC
  Filled 2015-06-08: qty 1

## 2015-06-08 MED ORDER — CETYLPYRIDINIUM CHLORIDE 0.05 % MT LIQD
7.0000 mL | Freq: Two times a day (BID) | OROMUCOSAL | Status: DC
  Administered 2015-06-10: 7 mL via OROMUCOSAL

## 2015-06-08 MED ORDER — NITROGLYCERIN 0.4 MG SL SUBL: 0.4000 mg | SUBLINGUAL_TABLET | SUBLINGUAL | Status: DC | PRN

## 2015-06-08 MED ORDER — ACETAMINOPHEN 325 MG PO TABS: 650.0000 mg | ORAL_TABLET | ORAL | Status: DC | PRN

## 2015-06-08 MED ORDER — HEPARIN SODIUM (PORCINE) 5000 UNIT/ML IJ SOLN: 5000.0000 [IU] | Freq: Three times a day (TID) | INTRAMUSCULAR | Status: DC

## 2015-06-08 NOTE — ED Provider Notes (Signed)
CSN: [id number]     Arrival date & time 06/08/15  1851 History   First MD Initiated Contact with Patient 06/08/15 1900     Chief Complaint  Patient presents with  . Dizziness     The history is provided by the patient. No language interpreter was used.   Maxwell Li is a 60 y.o. male who presents to the Emergency Department complaining of dizziness. Symptoms started about 5:15 today when he was driving home from work. He developed sudden onset left sided head altered sensation, inability to swallow, diaphoresis, tingling in the left arm. He took an aspirin and his symptoms improved only to return again about 15 minutes later and then he took a second aspirin. He called 911. His symptoms resolved and then he had recurrence episodes twice for EMS. He denies any pain in his head or chest, shortness of breath, nausea, vomiting, weakness. Symptoms are severe, waxing and waning. He is currently asymptomatic in the emergency department.  Past Medical History  Diagnosis Date  . DYSLIPIDEMIA 06/28/2009    Qualifier: Diagnosis of  By: Loanne Drilling MD, Jacelyn Pi   . GERD 09/04/2006    Qualifier: Diagnosis of  By: Loanne Drilling MD, Jacelyn Pi   . HYPOGONADISM, MALE 05/20/2008    Qualifier: Diagnosis of  By: Loanne Drilling MD, Jacelyn Pi KNEE PAIN 03/24/2007    Qualifier: Diagnosis of  By: Loanne Drilling MD, Jacelyn Pi Ventral hernia   . Hyperglycemia   . Hypertension    Past Surgical History  Procedure Laterality Date  . Electrocardiogram  02/01/2006  . Rest cardiolite  11/21/2000  . 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 Cancer Brother     Lung Cancer   Social History  Substance Use Topics  . Smoking status: Former Research scientist (life sciences)  . Smokeless tobacco: Never Used  . Alcohol Use: No    Review of Systems  All other systems reviewed and are negative.     Allergies  Lidocaine; Lovastatin; and Shellfish allergy  Home Medications   Prior to Admission medications    Medication Sig Start Date End Date Taking? Authorizing Provider  ibuprofen (ADVIL,MOTRIN) 200 MG tablet Take 200 mg by mouth every 6 (six) hours as needed for moderate pain.   Yes Historical Provider, MD  testosterone cypionate (DEPOTESTOTERONE CYPIONATE) 200 MG/ML injection Inject 1 mL (200 mg total) into the muscle every 14 (fourteen) days. 04/28/13  Yes Renato Shin, MD  Fenofibrate 150 MG CAPS 1/2 tab daily Patient not taking: Reported on 06/08/2015 06/08/11   Renato Shin, MD  halobetasol (ULTRAVATE) 0.05 % cream Apply topically 3 (three) times daily as needed. For itching Patient not taking: Reported on 06/08/2015 06/30/12   Renato Shin, MD  methylPREDNISolone (MEDROL DOSEPAK) 4 MG tablet follow package directions Patient not taking: Reported on 06/08/2015 07/01/12   Renato Shin, MD  sildenafil (VIAGRA) 100 MG tablet Take 1 tablet (100 mg total) by mouth as needed for erectile dysfunction. 06/24/12 06/24/13  Renato Shin, MD  triamcinolone cream (KENALOG) 0.1 % Apply topically 3 (three) times daily. As needed for rash Patient not taking: Reported on 06/08/2015 07/28/12   Renato Shin, MD   BP 153/106 mmHg  Pulse 67  Temp(Src) 97.7 F (36.5 C) (Oral)  Resp 25  Ht 5' 3"  (1.6 m)  Wt 192 lb 14.4 oz (87.5 kg)  BMI 34.18 kg/m2  SpO2 100% Physical Exam  Constitutional:  He is oriented to person, place, and time. He appears well-developed and well-nourished.  HENT:  Head: Normocephalic and atraumatic.  Cardiovascular: Normal rate and regular rhythm.   No murmur heard. Pulmonary/Chest: Effort normal and breath sounds normal. No respiratory distress.  Abdominal: Soft. There is no tenderness. There is no rebound and no guarding.  Musculoskeletal: He exhibits no edema or tenderness.  Neurological: He is alert and oriented to person, place, and time. No cranial nerve deficit. Coordination normal.  Skin: Skin is warm. He is diaphoretic.  Psychiatric: He has a normal mood and affect. His behavior is  normal.  Nursing note and vitals reviewed.   ED Course  Procedures   CRITICAL CARE Performed by: Quintella Reichert   Total critical care time: 30 minutes  Critical care time was exclusive of separately billable procedures and treating other patients.  Critical care was necessary to treat or prevent imminent or life-threatening deterioration.  Critical care was time spent personally by me on the following activities: development of treatment plan with patient and/or surrogate as well as nursing, discussions with consultants, evaluation of patient's response to treatment, examination of patient, obtaining history from patient or surrogate, ordering and performing treatments and interventions, ordering and review of laboratory studies, ordering and review of radiographic studies, pulse oximetry and re-evaluation of patient's condition.  Labs Review Labs Reviewed  CBC WITH DIFFERENTIAL/PLATELET - Abnormal; Notable for the following:    RDW 16.3 (*)    All other components within normal limits  COMPREHENSIVE METABOLIC PANEL - Abnormal; Notable for the following:    Sodium 132 (*)    Chloride 98 (*)    Glucose, Bld 120 (*)    Calcium 8.7 (*)    Total Protein 6.1 (*)    Albumin 3.3 (*)    ALT 16 (*)    All other components within normal limits  SURGICAL PCR SCREEN  CBC  URINALYSIS, ROUTINE W REFLEX MICROSCOPIC (NOT AT [facility])  CREATININE, SERUM  BASIC METABOLIC PANEL  LIPID PANEL  I-STAT TROPOININ, ED    Imaging Review Dg Chest 2 View  06/08/2015  CLINICAL DATA:  Dizziness. EXAM: CHEST  2 VIEW COMPARISON:  April 04, 2007 FINDINGS: The heart size and mediastinal contours are within normal limits. Both lungs are clear. The visualized skeletal structures are unremarkable. IMPRESSION: No active cardiopulmonary disease. Electronically Signed   By: Dorise Bullion III M.D   On: 06/08/2015 19:54   Ct Head Wo Contrast  06/08/2015  CLINICAL DATA:  Acute onset of left-sided facial numbness.  Initial encounter. EXAM: CT HEAD WITHOUT CONTRAST TECHNIQUE: Contiguous axial images were obtained from the base of the skull through the vertex without intravenous contrast. COMPARISON:  MRI of the brain performed 04/05/2004 FINDINGS: There is no evidence of acute infarction, mass lesion, or intra- or extra-axial hemorrhage on CT. The posterior fossa, including the cerebellum, brainstem and fourth ventricle, is within normal limits. The third and lateral ventricles, and basal ganglia are unremarkable in appearance. The cerebral hemispheres are symmetric in appearance, with normal gray-white differentiation. No mass effect or midline shift is seen. There is no evidence of fracture; the patient is status post left-sided mastoidectomy. The orbits are within normal limits. The paranasal sinuses and mastoid air cells are well-aerated. No significant soft tissue abnormalities are seen. IMPRESSION: Unremarkable noncontrast CT of the head. Electronically Signed   By: Garald Balding M.D.   On: 06/08/2015 20:33   I have personally reviewed and evaluated these images and lab results as  part of my medical decision-making.   EKG Interpretation   Date/Time:  Wednesday Jun 08 2015 20:55:34 EDT Ventricular Rate:  36 PR Interval:  206 QRS Duration: 93 QT Interval:  430 QTC Calculation: 333 R Axis:   98 Text Interpretation:  Junctional rhythm Right axis deviation Confirmed by  Hazle Coca [phone]) on 06/08/2015 9:00:49 PM      MDM   Final diagnoses:  Sick sinus syndrome [facility])    Patient here for episodes of diaphoresis and near syncope. He is in no distress on initial evaluation. Patient with recurrent episode of diaphoresis and discomfort in the emergency department. He was noted to be bradycardic with a heart rate of 32. Telemetry demonstrates episodes of asystole and repeat EKG demonstrates a junctional escape rhythm. Defibrillator/pacing pads were placed emergently in patient's heart rate recovered and  symptoms resolved prior to additional interventions. Cardiology consulted and evaluated the patient in the department. Plan to admit for further treatment.    Quintella Reichert, MD 06/09/15 0040

## 2015-06-08 NOTE — ED Notes (Signed)
Patient transported to X-ray and ct

## 2015-06-08 NOTE — ED Notes (Addendum)
Pt hit call bell, [name], rn responded to pt room to find hr of 32, pt not responsive to verbal stimuli, and pt diaphoretic and pale. Dr. Ralene Bathe was called to bedside. Pt hr increased to 70 with no intervention- I then arrived at bedside as the pt became more responsive. He then began complaining of increased headache and tightness in throat with inability to swallow. Pt hob raised, placed on ZOL and atropine to bedside per dr. Ralene Bathe. Pt A&Ox 4 at this time NSR rate 72.

## 2015-06-08 NOTE — ED Notes (Addendum)
Pt family at bedside

## 2015-06-08 NOTE — ED Notes (Signed)
Cardiologist at bedside.  

## 2015-06-08 NOTE — ED Notes (Signed)
Pt's ekg rhythm noted to be 32. After arriving to room, pt noted to be less responsive, diaphoretic and pale. EKG repeated; Dr.Rees at aware and at bedside. Zoll pads placed on patient with atropine at bedside per EDP

## 2015-06-08 NOTE — H&P (Signed)
Patient ID: STEELE STRACENER MRN: [id number], DOB/AGE: [birth year]   Admit date: 06/08/2015   Primary Physician: Renato Shin, MD Primary Cardiologist: new   Pt. Profile:  Mr. Purves is obese 60 year old Caucasian male with PMH of HTN, HLD and hypogonadism presented with weakness, found to have intermittent junctional bradycardia and sinus arrest   Problem List  Past Medical History  Diagnosis Date  . DYSLIPIDEMIA 06/28/2009    Qualifier: Diagnosis of  By: Loanne Drilling MD, Jacelyn Pi   . GERD 09/04/2006    Qualifier: Diagnosis of  By: Loanne Drilling MD, Jacelyn Pi   . HYPOGONADISM, MALE 05/20/2008    Qualifier: Diagnosis of  By: Loanne Drilling MD, Jacelyn Pi KNEE PAIN 03/24/2007    Qualifier: Diagnosis of  By: Loanne Drilling MD, Jacelyn Pi Ventral hernia   . Hyperglycemia   . Hypertension     Past Surgical History  Procedure Laterality Date  . Electrocardiogram  02/01/2006  . Rest cardiolite  11/21/2000  . 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 Lidocaine     Throat swelling   . Lovastatin     REACTION: Nausea  . Shellfish Allergy     Itching     HPI  Mr. Barcellos is obese 60 year old Caucasian male with PMH of HTN, HLD and hypogonadism. He is not currently taking any medications other than testosterone injection. His last testosterone injection was a week ago during his follow-up with his PCP. Although fenofibrate as listed on his medication list, he has not been taking it. He says he was diagnosed with vertigo several years ago due to recurrent dizziness, he was prescribed meclizine which did not help.  Otherwise he has been in his usual state of health. He denies any recent fever, chill, cough, lower extremity edema, chest pain or shortness breath. Around 5 PM tonight while driving, he started having dizzy spell, weakness and "clammy headed". The symptom recurred multiple episodes throughout the night and eventually he decided to call EMS. On EMS  arrival, he was noted to be having intermittent bradycardic episode with heart rate down to the 30s. Since ED arrival, he has had multiple episodes as well including episode while he was getting head CT scan. CT scan of the head was negative for acute process. Chest x-ray was normal. Basic metabolic panel shows sodium 132, potassium 3.9, creatinine 0.75. Point-of-care troponin was negative. Again patient denies any recent chest pain. EKG showed junctional rhythm with heart rate in the 30s. Of note around 8:55 PM, he had a 9.2 second sinus arrest. Patient came into the room finding him unresponsive, however prior to any intervention, he regained his mental status. Cardiology has been consulted for sinus arrest.    Home Medications  Prior to Admission medications   Medication Sig Start Date End Date Taking? Authorizing Provider  ibuprofen (ADVIL,MOTRIN) 200 MG tablet Take 200 mg by mouth every 6 (six) hours as needed for moderate pain.   Yes Historical Provider, MD  testosterone cypionate (DEPOTESTOTERONE CYPIONATE) 200 MG/ML injection Inject 1 mL (200 mg total) into the muscle every 14 (fourteen) days. 04/28/13  Yes Renato Shin, MD  Fenofibrate 150 MG CAPS 1/2 tab daily Patient not taking: Reported on 06/08/2015 06/08/11   Renato Shin, MD  halobetasol (ULTRAVATE) 0.05 % cream Apply topically 3 (three) times daily as needed. For itching Patient not taking: Reported on 06/08/2015 06/30/12   Renato Shin,  MD  methylPREDNISolone (MEDROL DOSEPAK) 4 MG tablet follow package directions Patient not taking: Reported on 06/08/2015 07/01/12   Renato Shin, MD  sildenafil (VIAGRA) 100 MG tablet Take 1 tablet (100 mg total) by mouth as needed for erectile dysfunction. 06/24/12 06/24/13  Renato Shin, MD  triamcinolone cream (KENALOG) 0.1 % Apply topically 3 (three) times daily. As needed for rash Patient not taking: Reported on 06/08/2015 07/28/12   Renato Shin, MD    Family History  Family History  Problem  Relation Age of Onset  . Cancer Brother     Lung Cancer    Social History  Social History   Social History  . Marital Status: Divorced    Spouse Name: N/A  . Number of Children: N/A  . Years of Education: N/A   Occupational History  . Pastor    Social History Main Topics  . Smoking status: Former Research scientist (life sciences)  . Smokeless tobacco: Never Used  . Alcohol Use: No  . Drug Use: No  . Sexual Activity: Not on file   Other Topics Concern  . Not on file   Social History Narrative     Review of Systems General:  No chills, fever, night sweats or weight changes.  Cardiovascular:  No chest pain, dyspnea on exertion, edema, orthopnea, palpitations, paroxysmal nocturnal dyspnea. Dermatological: No rash, lesions/masses Respiratory: No cough, dyspnea Urologic: No hematuria, dysuria Abdominal:   No nausea, vomiting, diarrhea, bright red blood per rectum, melena, or hematemesis Neurologic:  No visual changes +wkns, changes in mental status. All other systems reviewed and are otherwise negative except as noted above.  Physical Exam  Blood pressure 146/92, pulse 76, temperature 97.4 F (36.3 C), temperature source Oral, resp. rate 14, height 5' 3"  (1.6 m), weight 184 lb (83.462 kg), SpO2 99 %.  General: Pleasant, NAD Psych: Normal affect. Neuro: Alert and oriented X 3. Moves all extremities spontaneously. HEENT: Normal  Neck: Supple without bruits or JVD. Lungs:  Resp regular and unlabored, CTA. Heart: RRR no s3, s4, or murmurs. Abdomen: Soft, non-tender, non-distended, BS + x 4.  Extremities: No clubbing, cyanosis or edema. DP/PT/Radials 2+ and equal bilaterally.  Labs  Troponin [facility] Test)  Recent Labs  06/08/15 1947  TROPIPOC 0.00   No results for input(s): CKTOTAL, CKMB, TROPONINI in the last 72 hours. Lab Results  Component Value Date   WBC 4.6 06/08/2015   HGB 13.4 06/08/2015   HCT 41.1 06/08/2015   MCV 90.1 06/08/2015   PLT 225 06/08/2015    Recent  Labs Lab 06/08/15 2042  NA 132*  K 3.9  CL 98*  CO2 26  BUN 7  CREATININE 0.75  CALCIUM 8.7*  PROT 6.1*  BILITOT 0.4  ALKPHOS 61  ALT 16*  AST 22  GLUCOSE 120*   Lab Results  Component Value Date   CHOL 198 06/24/2012   HDL 38.20* 06/24/2012   LDLCALC 72 07/22/2006   TRIG 271.0* 06/24/2012   No results found for: DDIMER   Radiology/Studies  Dg Chest 2 View  06/08/2015  CLINICAL DATA:  Dizziness. EXAM: CHEST  2 VIEW COMPARISON:  April 04, 2007 FINDINGS: The heart size and mediastinal contours are within normal limits. Both lungs are clear. The visualized skeletal structures are unremarkable. IMPRESSION: No active cardiopulmonary disease. Electronically Signed   By: Dorise Bullion III M.D   On: 06/08/2015 19:54   Ct Head Wo Contrast  06/08/2015  CLINICAL DATA:  Acute onset of left-sided facial numbness. Initial encounter. EXAM:  CT HEAD WITHOUT CONTRAST TECHNIQUE: Contiguous axial images were obtained from the base of the skull through the vertex without intravenous contrast. COMPARISON:  MRI of the brain performed 04/05/2004 FINDINGS: There is no evidence of acute infarction, mass lesion, or intra- or extra-axial hemorrhage on CT. The posterior fossa, including the cerebellum, brainstem and fourth ventricle, is within normal limits. The third and lateral ventricles, and basal ganglia are unremarkable in appearance. The cerebral hemispheres are symmetric in appearance, with normal gray-white differentiation. No mass effect or midline shift is seen. There is no evidence of fracture; the patient is status post left-sided mastoidectomy. The orbits are within normal limits. The paranasal sinuses and mastoid air cells are well-aerated. No significant soft tissue abnormalities are seen. IMPRESSION: Unremarkable noncontrast CT of the head. Electronically Signed   By: Garald Balding M.D.   On: 06/08/2015 20:33    ECG  Junctional bradycardia without obvious P wave on  EKG  Echocardiogram  pending    ASSESSMENT AND PLAN  1. 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 He denies any recent chest pain or exertional dyspnea to indicate significant underlying disease. His troponin is negative.  - He is not on any AV nodal agent. Will obtain echo  2. HTN: Although carry a diagnosis of hypertension, he is not on any blood pressure medication, his current blood pressure is running in the 130 to 160s in the ED. Once his heart rate improved, we'll consider adding lisinopril.  3. HLD: He is no longer taking fenofibrate, will check lipid panel in a.m.   Signed, Almyra Deforest, PA-C 06/08/2015, 9:39 PM  The patient was seen and examined, and I agree with the history, physical exam, assessment and plan as documented above which has been discussed with H. Meng PA-C, with modifications as noted below.  Pt with aforementioned symptoms and no significant cardiac history nor thyroid disease presenting with junctional rhythm in 30 bpm range, transient 2:1 AV block, and marked sinus pauses (>9 seconds). He had symptoms while getting a head CT tonight as well. CBC, BMET, and recent TSH (1.87 on 05/27/15) unremarkable. Had an insect bite yesterday but is unsure what it was. Not on any AV nodal blocking agents.  I have notified Dr. Posey Pronto, the fellow on call. Patient has pacing pads on but may very well require a temporary pacing wire which would need to be performed by interventional cardiology. Will have EP see on 06/09/15 as he will eventually require a permanent pacemaker.  Kate Sable, MD, [facility]  06/08/2015 9:42 PM

## 2015-06-08 NOTE — ED Notes (Signed)
RN called to CT after pt became lightheaded following scan. Pt diaphoretic, c/o head pressure. Per Ct tech vitals were checked and were WNL. Symptoms resolved at this time

## 2015-06-08 NOTE — ED Notes (Signed)
PT was driving home from work tonight at [employer] when he felt dizzy, clammy  And head pressure. Pt reports he took 2tabs  ,347m of ASA at home. On arrival to ED Pt reported another episode for head pressurfe and clammyh feeling.

## 2015-06-09 ENCOUNTER — Encounter (HOSPITAL_COMMUNITY): Payer: Self-pay | Admitting: Cardiology

## 2015-06-09 ENCOUNTER — Encounter (HOSPITAL_COMMUNITY)
Admission: EM | Disposition: A | Discharge status: Home / Self Care | Payer: Self-pay | Source: Home / Self Care | Attending: Cardiovascular Disease

## 2015-06-09 ENCOUNTER — Inpatient Hospital Stay (HOSPITAL_COMMUNITY): Payer: BLUE CROSS/BLUE SHIELD

## 2015-06-09 DIAGNOSIS — R55 Syncope and collapse: Secondary | ICD-10-CM

## 2015-06-09 DIAGNOSIS — I442 Atrioventricular block, complete: Secondary | ICD-10-CM | POA: Insufficient documentation

## 2015-06-09 HISTORY — PX: EP IMPLANTABLE DEVICE: SHX172B

## 2015-06-09 LAB — CREATININE, SERUM: CREATININE: 0.68 mg/dL (ref 0.61–1.24)

## 2015-06-09 LAB — CBC
HCT: 41.3 % (ref 39.0–52.0)
HEMOGLOBIN: 13.3 g/dL (ref 13.0–17.0)
MCH: 29.2 pg (ref 26.0–34.0)
MCHC: 32.2 g/dL (ref 30.0–36.0)
MCV: 90.6 fL (ref 78.0–100.0)
Platelets: 216 10*3/uL (ref 150–400)
RBC: 4.56 MIL/uL (ref 4.22–5.81)
RDW: 14.8 % (ref 11.5–15.5)
WBC: 4.5 10*3/uL (ref 4.0–10.5)

## 2015-06-09 LAB — TROPONIN I
Troponin I: <0.03 ng/mL (ref <0.031)
Troponin I: 0.04 ng/mL — ABNORMAL HIGH (ref <0.031)
Troponin I: 0.07 ng/mL — ABNORMAL HIGH (ref <0.031)

## 2015-06-09 LAB — URINALYSIS, ROUTINE W REFLEX MICROSCOPIC
Bilirubin Urine: NEGATIVE
GLUCOSE, UA: NEGATIVE mg/dL
Hgb urine dipstick: NEGATIVE
KETONES UR: NEGATIVE mg/dL
LEUKOCYTES UA: NEGATIVE
NITRITE: NEGATIVE
PROTEIN: NEGATIVE mg/dL
Specific Gravity, Urine: 1.018 (ref 1.005–1.030)
pH: 7 (ref 5.0–8.0)

## 2015-06-09 LAB — BASIC METABOLIC PANEL
Anion gap: 6 (ref 5–15)
BUN: <5 mg/dL — ABNORMAL LOW (ref 6–20)
CALCIUM: 9 mg/dL (ref 8.9–10.3)
CO2: 31 mmol/L (ref 22–32)
CREATININE: 0.71 mg/dL (ref 0.61–1.24)
Chloride: 98 mmol/L — ABNORMAL LOW (ref 101–111)
GFR calc Af Amer: >60 mL/min (ref >60)
GFR calc non Af Amer: >60 mL/min (ref >60)
GLUCOSE: 119 mg/dL — AB (ref 65–99)
Potassium: 4.4 mmol/L (ref 3.5–5.1)
Sodium: 135 mmol/L (ref 135–145)

## 2015-06-09 LAB — SURGICAL PCR SCREEN
MRSA, PCR: NEGATIVE
Staphylococcus aureus: NEGATIVE

## 2015-06-09 LAB — LIPID PANEL
Cholesterol: 203 mg/dL — ABNORMAL HIGH (ref 0–200)
HDL: 43 mg/dL (ref >40)
LDL CALC: 136 mg/dL — AB (ref 0–99)
Total CHOL/HDL Ratio: 4.7 RATIO
Triglycerides: 118 mg/dL (ref <150)
VLDL: 24 mg/dL (ref 0–40)

## 2015-06-09 LAB — TSH: TSH: 1.634 u[IU]/mL (ref 0.350–4.500)

## 2015-06-09 LAB — ECHOCARDIOGRAM COMPLETE
HEIGHTINCHES: 63 in
WEIGHTICAEL: 3086.44 [oz_av]

## 2015-06-09 SURGERY — PACEMAKER IMPLANT

## 2015-06-09 MED ORDER — ONDANSETRON HCL 4 MG/2ML IJ SOLN: 4.0000 mg | Freq: Four times a day (QID) | INTRAMUSCULAR | Status: DC | PRN

## 2015-06-09 MED ORDER — CEFAZOLIN SODIUM-DEXTROSE 2-4 GM/100ML-% IV SOLN
INTRAVENOUS | Status: AC
  Filled 2015-06-09: qty 100

## 2015-06-09 MED ORDER — FENTANYL CITRATE (PF) 100 MCG/2ML IJ SOLN
INTRAMUSCULAR | Status: AC
  Filled 2015-06-09: qty 2

## 2015-06-09 MED ORDER — BUPIVACAINE HCL (PF) 0.25 % IJ SOLN
INTRAMUSCULAR | Status: AC
  Filled 2015-06-09: qty 60

## 2015-06-09 MED ORDER — BUPIVACAINE HCL (PF) 0.25 % IJ SOLN
INTRAMUSCULAR | Status: DC | PRN
  Administered 2015-06-09: 45 mL

## 2015-06-09 MED ORDER — SODIUM CHLORIDE 0.9 % IV SOLN
INTRAVENOUS | Status: DC
  Administered 2015-06-09: 11:00:00 via INTRAVENOUS

## 2015-06-09 MED ORDER — HEPARIN (PORCINE) IN NACL 2-0.9 UNIT/ML-% IJ SOLN
INTRAMUSCULAR | Status: DC | PRN
  Administered 2015-06-09: 11:00:00

## 2015-06-09 MED ORDER — CEFAZOLIN SODIUM-DEXTROSE 2-4 GM/100ML-% IV SOLN
2.0000 g | INTRAVENOUS | Status: AC
  Administered 2015-06-09: 2 g via INTRAVENOUS
  Filled 2015-06-09: qty 100

## 2015-06-09 MED ORDER — MIDAZOLAM HCL 5 MG/5ML IJ SOLN
INTRAMUSCULAR | Status: DC | PRN
  Administered 2015-06-09: 1 mg via INTRAVENOUS
  Administered 2015-06-09: 2 mg via INTRAVENOUS

## 2015-06-09 MED ORDER — CEFAZOLIN SODIUM-DEXTROSE 2-4 GM/100ML-% IV SOLN
2.0000 g | Freq: Four times a day (QID) | INTRAVENOUS | Status: AC
  Administered 2015-06-09 – 2015-06-10 (×3): 2 g via INTRAVENOUS
  Filled 2015-06-09 (×4): qty 100

## 2015-06-09 MED ORDER — CHLORHEXIDINE GLUCONATE 4 % EX LIQD
60.0000 mL | Freq: Once | CUTANEOUS | Status: DC
  Filled 2015-06-09: qty 60

## 2015-06-09 MED ORDER — CHLORHEXIDINE GLUCONATE 4 % EX LIQD: 60.0000 mL | Freq: Once | CUTANEOUS | Status: DC

## 2015-06-09 MED ORDER — SODIUM CHLORIDE 0.9 % IR SOLN
80.0000 mg | Status: AC
  Administered 2015-06-09: 80 mg
  Filled 2015-06-09: qty 2

## 2015-06-09 MED ORDER — ACETAMINOPHEN 325 MG PO TABS
325.0000 mg | ORAL_TABLET | ORAL | Status: DC | PRN
  Administered 2015-06-09: 650 mg via ORAL
  Filled 2015-06-09: qty 2

## 2015-06-09 MED ORDER — PERFLUTREN LIPID MICROSPHERE
1.0000 mL | INTRAVENOUS | Status: AC | PRN
  Administered 2015-06-09: 2 mL via INTRAVENOUS
  Filled 2015-06-09 (×2): qty 10

## 2015-06-09 MED ORDER — MIDAZOLAM HCL 5 MG/5ML IJ SOLN
INTRAMUSCULAR | Status: AC
  Filled 2015-06-09: qty 5

## 2015-06-09 MED ORDER — SODIUM CHLORIDE 0.9 % IR SOLN
Status: AC
  Filled 2015-06-09: qty 2

## 2015-06-09 MED ORDER — FENTANYL CITRATE (PF) 100 MCG/2ML IJ SOLN
INTRAMUSCULAR | Status: DC | PRN
  Administered 2015-06-09 (×2): 25 ug via INTRAVENOUS

## 2015-06-09 SURGICAL SUPPLY — 7 items
CABLE SURGICAL S-101-97-12 (CABLE) ×3 IMPLANT
LEAD TENDRIL MRI 46CM LPA1200M (Lead) ×3 IMPLANT
LEAD TENDRIL MRI 52CM LPA1200M (Lead) ×3 IMPLANT
PACEMAKER ASSURITY DR-RF (Pacemaker) ×3 IMPLANT
PAD DEFIB LIFELINK (PAD) ×3 IMPLANT
SHEATH CLASSIC 8F (SHEATH) ×6 IMPLANT
TRAY PACEMAKER INSERTION (PACKS) ×3 IMPLANT

## 2015-06-09 NOTE — Progress Notes (Signed)
  Echocardiogram 2D Echocardiogram has been performed.  Maxwell Li M 06/09/2015, 8:48 AM

## 2015-06-09 NOTE — Progress Notes (Signed)
Pt has arrived to 2w from cath lab. Pt reported no pain at this time. Vitals are stable. Pt has been educated about left arm sling. Pt has been oriented to room and is resting comfortably in bed at this time. Will continue to monitor.  Grant Fontana RN, BSN

## 2015-06-09 NOTE — Consult Note (Signed)
ELECTROPHYSIOLOGY CONSULT NOTE    Patient ID: Maxwell Li MRN: [id number], DOB/AGE: [birth year] 60 y.o.  Admit date: 06/08/2015 Date of Consult: 06/09/2015  Primary Physician: Rubie Maid, MD Primary Cardiologist: new to [facility] Referring MD: Bronson Ing  Reason for Consultation: complete heart block, junctional rhythm  HPI:  Maxwell Li is a 60 y.o. male with a past medical history significant for hypertension, dyslipidemia and GERD.  He has had periodic dizziness over the last several months that lasts for seconds and then resolves on it's own.  Yesterday, he had several episodes in a 2 hour period and he called EMS for evaluation. His episodes were associated with dizziness, weakness and clammy headedness.  In the ER, he was found to have episodes of complete heart block with similar symptoms to what prompted him to call EMS.    He has had no prior cardiac testing.  He denies recent chest pain, shortness of breath, fevers, chills, nausea or vomiting. He has not had frank syncope  EP has been asked to evaluate for treatment options.   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   Past Medical History  Diagnosis Date  . DYSLIPIDEMIA 06/28/2009    Qualifier: Diagnosis of  By: Loanne Drilling MD, Jacelyn Pi   . GERD 09/04/2006    Qualifier: Diagnosis of  By: Loanne Drilling MD, Jacelyn Pi   . HYPOGONADISM, MALE 05/20/2008    Qualifier: Diagnosis of  By: Loanne Drilling MD, Jacelyn Pi KNEE PAIN 03/24/2007    Qualifier: Diagnosis of  By: Loanne Drilling MD, Jacelyn Pi Ventral hernia   . Hyperglycemia   . Hypertension      Surgical History:  Past Surgical History  Procedure Laterality Date  . Electrocardiogram  02/01/2006  . Rest cardiolite  11/21/2000  . 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 ibuprofen (ADVIL,MOTRIN) 200 MG tablet Take 200 mg by mouth every 6 (six) hours  as needed for moderate pain.   06/08/2015 at Unknown time  . testosterone cypionate (DEPOTESTOTERONE CYPIONATE) 200 MG/ML injection Inject 1 mL (200 mg total) into the muscle every 14 (fourteen) days. 10 mL 0 Past Week at Unknown time  . Fenofibrate 150 MG CAPS 1/2 tab daily (Patient not taking: Reported on 06/08/2015) 30 each 11 Not Taking at Unknown time  . halobetasol (ULTRAVATE) 0.05 % cream Apply topically 3 (three) times daily as needed. For itching (Patient not taking: Reported on 06/08/2015) 50 g 1 Not Taking at Unknown time  . methylPREDNISolone (MEDROL DOSEPAK) 4 MG tablet follow package directions (Patient not taking: Reported on 06/08/2015) 21 tablet 0 Not Taking at Unknown time  . sildenafil (VIAGRA) 100 MG tablet Take 1 tablet (100 mg total) by mouth as needed for erectile dysfunction. 20 tablet 3   . triamcinolone cream (KENALOG) 0.1 % Apply topically 3 (three) times daily. As needed for rash (Patient not taking: Reported on 06/08/2015) 454 g 3 Not Taking at Unknown time    Inpatient Medications:  . antiseptic oral rinse  7 mL Mouth Rinse BID    Allergies:  Allergies  Allergen Reactions  . Lidocaine     Throat swelling   . Lovastatin     REACTION: Nausea  . Shellfish Allergy     Itching     Social History   Social History  . Marital  Status: Divorced    Spouse Name: N/A  . Number of Children: N/A  . Years of Education: N/A   Occupational History  . Pastor    Social History Main Topics  . Smoking status: Former Research scientist (life sciences)  . Smokeless tobacco: Never Used  . Alcohol Use: No  . Drug Use: No  . Sexual Activity: Not on file   Other Topics Concern  . Not on file   Social History Narrative     Family History  Problem Relation Age of Onset  . Cancer Brother     Lung Cancer     Review of Systems: All other systems reviewed and are otherwise negative except as noted above.  Physical Exam: Filed Vitals:   06/09/15 0100 06/09/15 0200 06/09/15 0300 06/09/15 0400    BP: 142/74 139/74 134/88 111/85  Pulse: 60 62 65 65  Temp:    97.5 F (36.4 C)  TempSrc:    Oral  Resp: 11 12 12 14   Height:      Weight:      SpO2: 100% 100% 99% 100%    GEN- The patient is well appearing, alert and oriented x 3 today.   HEENT: normocephalic, atraumatic; sclera clear, conjunctiva pink; hearing intact; oropharynx clear; neck supple Lungs- Clear to ausculation bilaterally, normal work of breathing.  No wheezes, rales, rhonchi Heart- Regular rate and rhythm, no murmurs, rubs or gallops GI- soft, non-tender, non-distended, bowel sounds present Extremities- no clubbing, cyanosis, or edema; DP/PT/radial pulses 2+ bilaterally MS- no significant deformity or atrophy Skin- warm and dry, no rash or lesion Psych- euthymic mood, full affect Neuro- strength and sensation are intact  Labs:   Lab Results  Component Value Date   WBC 4.5 06/09/2015   HGB 13.3 06/09/2015   HCT 41.3 06/09/2015   MCV 90.6 06/09/2015   PLT 216 06/09/2015    Recent Labs Lab 06/08/15 2042 06/09/15 0004  NA 132* 135  K 3.9 4.4  CL 98* 98*  CO2 26 31  BUN 7 <5*  CREATININE 0.75 0.71  0.68  CALCIUM 8.7* 9.0  PROT 6.1*  --   BILITOT 0.4  --   ALKPHOS 61  --   ALT 16*  --   AST 22  --   GLUCOSE 120* 119*      Radiology/Studies: Dg Chest 2 View 06/08/2015  CLINICAL DATA:  Dizziness. EXAM: CHEST  2 VIEW COMPARISON:  April 04, 2007 FINDINGS: The heart size and mediastinal contours are within normal limits. Both lungs are clear. The visualized skeletal structures are unremarkable. IMPRESSION: No active cardiopulmonary disease. Electronically Signed   By: Dorise Bullion III M.D   On: 06/08/2015 19:54   Ct Head Wo Contrast 06/08/2015  CLINICAL DATA:  Acute onset of left-sided facial numbness. Initial encounter. EXAM: CT HEAD WITHOUT CONTRAST TECHNIQUE: Contiguous axial images were obtained from the base of the skull through the vertex without intravenous contrast. COMPARISON:  MRI of the brain  performed 04/05/2004 FINDINGS: There is no evidence of acute infarction, mass lesion, or intra- or extra-axial hemorrhage on CT. The posterior fossa, including the cerebellum, brainstem and fourth ventricle, is within normal limits. The third and lateral ventricles, and basal ganglia are unremarkable in appearance. The cerebral hemispheres are symmetric in appearance, with normal gray-white differentiation. No mass effect or midline shift is seen. There is no evidence of fracture; the patient is status post left-sided mastoidectomy. The orbits are within normal limits. The paranasal sinuses and mastoid air cells are well-aerated.  No significant soft tissue abnormalities are seen. IMPRESSION: Unremarkable noncontrast CT of the head. Electronically Signed   By: Garald Balding M.D.   On: 06/08/2015 20:33    WVP:XTGGY rhythm, 1st degree AV block  TELEMETRY: sinus rhythm with intermittent complete heart block   Assessment/Plan: 1.  Symptomatic complete heart block The patient presents with symptomatic complete heart block associated with pre-syncope and weakness.  He is on no AVN blocking agents, electrolytes normal.  Zohar Laing check TSH and echo this morning.  If echo normal, Shabrea Weldin plan pacemaker for later today. Risks, benefits discussed with patient who agrees to proceed.   2.  HTN On no meds Stable No change required today   Signed, Chanetta Marshall, NP 06/09/2015 6:53 AM  I have seen and examined this patient with Chanetta Marshall.  Agree with above, note added to reflect my findings.  On exam, regular rhythm, no murmurs, lungs clear.  Has had episodic dizziness with more episodes yesterday.  Found to have complete heart block when arrived to the hospital.  Discussed pacemaker placement.  Discussed risks and benefits of the procedure.  Risks include bleeding, infection, tamponade, pneumothorax.  The patient understands the risks and has agreed to pacemaker placement.  Marijah Larranaga get a TTE to evaluate LVEF prior to  the procedure.    Kushal Saunders M. Saachi Zale MD 06/09/2015 7:22 AM

## 2015-06-09 NOTE — Discharge Instructions (Signed)
° ° °  Supplemental Discharge Instructions for  Pacemaker/Defibrillator Patients  Activity No heavy lifting or vigorous activity with your left/right arm for 6 to 8 weeks.  Do not raise your left/right arm above your head for one week.  Gradually raise your affected arm as drawn below.           __        06/13/15                      06/14/15                        06/15/15                  06/16/15  NO DRIVING for    1 week  ; you may begin driving on  8/0/32   .  WOUND CARE - Keep the wound area clean and dry.  Do not get this area wet for one week. No showers for one week; you may shower on  06/16/15   . - The tape/steri-strips on your wound will fall off; do not pull them off.  No bandage is needed on the site.  DO  NOT apply any creams, oils, or ointments to the wound area. - If you notice any drainage or discharge from the wound, any swelling or bruising at the site, or you develop a fever > 101? F after you are discharged home, call the office at once.  Special Instructions - You are still able to use cellular telephones; use the ear opposite the side where you have your pacemaker/defibrillator.  Avoid carrying your cellular phone near your device. - When traveling through airports, show security personnel your identification card to avoid being screened in the metal detectors.  Ask the security personnel to use the hand wand. - Avoid arc welding equipment, MRI testing (magnetic resonance imaging), TENS units (transcutaneous nerve stimulators).  Call the office for questions about other devices. - Avoid electrical appliances that are in poor condition or are not properly grounded. - Microwave ovens are safe to be near or to operate.

## 2015-06-10 ENCOUNTER — Inpatient Hospital Stay (HOSPITAL_COMMUNITY): Payer: BLUE CROSS/BLUE SHIELD

## 2015-06-10 NOTE — Progress Notes (Signed)
Edger C Herber to be D/C'd Home per MD order. Discussed with the patient and all questions fully answered.    VVS, Skin clean, dry and intact without evidence of skin break down, no evidence of skin tears noted.  IV catheter discontinued intact. Site without signs and symptoms of complications. Dressing and pressure applied.  An After Visit Summary was printed and given to the patient.  Patient escorted via [place], and D/C home via private auto.  Cyndra Numbers  06/10/2015 11:17 AM

## 2015-06-10 NOTE — Discharge Summary (Signed)
ELECTROPHYSIOLOGY PROCEDURE DISCHARGE SUMMARY    Patient ID: Maxwell Li,  MRN: [id number], DOB/AGE: [birth year] 60 y.o.  Admit date: 06/08/2015 Discharge date: 06/10/2015  Primary Care Physician: Rubie Maid, MD Primary Cardiologist: (new) dr. Bronson Ing Electrophysiologist: Dr. Curt Bears  Primary Discharge Diagnosis:  Symptomatic bradycardia, CHB  status post pacemaker implantation this admission  Secondary Discharge Diagnosis:  1. HTN     Not on any meds at home  Allergies  Allergen Reactions  . Lidocaine     Throat swelling   . Lovastatin     REACTION: Nausea  . Shellfish Allergy     Itching      Procedures This Admission:  1.  Implantation of a STJ dual chamber PPM on 06/09/15 by Dr Curt Bears.  The patient received a [employer] MRI model [id number] (serial number [id number] ) pacemaker and [facility] model [id number] (serial number [id number]) right atrial lead and a [facility] model [id number] (serial number [id number]) right ventricular lead There were no immediate post procedure complications. 2.  CXR on 06/10/15 demonstrated no pneumothorax status post device implantation.   Brief HPI: Maxwell Li is a 60 y.o. male was admitted to [facility] recurrent dizzy and near syncopal episodes, observed in ER to have junctional rhythm into the 30s and intermittent episodes of CHB  Hospital Course:  The patient was admitted and finding no reversible causes for his heart block planned for PPM implantation.  He had no CP or SOB prior to or since his implant.  He feels well this morning, states he slept very well and is wothout any symptoms this morning.  An echo was done noting EF 50-55%, no WMA, he  underwent implantation of a PPM with details as outlined above.  He was monitored on telemetry overnight which demonstrated SR.  Left chest was without hematoma or ecchymosis.  The device was interrogated and found to be functioning normally.  CXR was obtained  and demonstrated no pneumothorax status post device implantation.  Wound care, arm mobility, and restrictions were reviewed with the patient.  The patient was examined by Dr. Curt Bears and considered stable for discharge to home.  He carries a hx of HTN not on any medicines, his BP has been borderline here, Alnita Aybar follow this out patient.   Physical Exam: Filed Vitals:   06/09/15 1400 06/09/15 1943 06/09/15 2316 06/10/15 0551  BP: 135/85 142/83  142/87  Pulse: 86 80 74 79  Temp:  98 F (36.7 C)  98.5 F (36.9 C)  TempSrc:  Oral  Oral  Resp:  18 16 18   Height:      Weight:      SpO2:  97%  97%    GEN- The patient is well appearing, alert and oriented x 3 today.   HEENT: normocephalic, atraumatic; sclera clear, conjunctiva pink; hearing intact; oropharynx clear; neck supple, no JVP Lungs- Clear to ausculation bilaterally, normal work of breathing.  No wheezes, rales, rhonchi Heart- Regular rate and rhythm, no murmurs, rubs or gallops, PMI not laterally displaced GI- soft, non-tender, non-distended, bowel sounds present, no hepatosplenomegaly Extremities- no clubbing, cyanosis, or edema; DP/PT/radial pulses 2+ bilaterally MS- no significant deformity or atrophy Skin- warm and dry, no rash or lesion, left chest without hematoma/ecchymosis Psych- euthymic mood, full affect Neuro- no gross deficits   Labs:   Lab Results  Component Value Date   WBC 4.5 06/09/2015   HGB 13.3 06/09/2015   HCT 41.3 06/09/2015   MCV  90.6 06/09/2015   PLT 216 06/09/2015    Recent Labs Lab 06/08/15 2042 06/09/15 0004  NA 132* 135  K 3.9 4.4  CL 98* 98*  CO2 26 31  BUN 7 <5*  CREATININE 0.75 0.71  0.68  CALCIUM 8.7* 9.0  PROT 6.1*  --   BILITOT 0.4  --   ALKPHOS 61  --   ALT 16*  --   AST 22  --   GLUCOSE 120* 119*    Discharge Medications:    Medication List    ASK your doctor about these medications        Fenofibrate 150 MG Caps  1/2 tab daily     halobetasol 0.05 % cream    Commonly known as:  ULTRAVATE  Apply topically 3 (three) times daily as needed. For itching     ibuprofen 200 MG tablet  Commonly known as:  ADVIL,MOTRIN  Take 200 mg by mouth every 6 (six) hours as needed for moderate pain.     methylPREDNISolone 4 MG tablet  Commonly known as:  MEDROL DOSEPAK  follow package directions     sildenafil 100 MG tablet  Commonly known as:  VIAGRA  Take 1 tablet (100 mg total) by mouth as needed for erectile dysfunction.     testosterone cypionate 200 MG/ML injection  Commonly known as:  DEPOTESTOSTERONE CYPIONATE  Inject 1 mL (200 mg total) into the muscle every 14 (fourteen) days.     triamcinolone cream 0.1 %  Commonly known as:  KENALOG  Apply topically 3 (three) times daily. As needed for rash        Disposition:  Home      Follow-up Information    Follow up with [facility] On 06/22/2015.   Specialty:  Cardiology   Why:  at 10:30AM  for wound check    Contact information:   [address], [place] [phone]      Follow up with Lexus Shampine Meredith Leeds, MD On 09/14/2015.   Specialty:  Cardiology   Why:  at 10:30AM    Contact information:   [place] [address] [phone]       Duration of Discharge Encounter: Greater than 30 minutes including physician time.  Venetia Night, PA-C 06/10/2015 7:31 AM  I have seen and examined this patient with Tommye Standard.  Agree with above, note added to reflect my findings.  On exam, regular rhythm, no murmurs, lungs clear.  Had dizziness and episodes of complete AV block.  Had dual chamber pacemaker placed.  CXR and interrogation this AM without abnormality.  Plan for follow up in device clinic in 10 days.    Dashia Caldeira M. Theresa Dohrman MD 06/10/2015 5:08 PM

## 2015-06-10 NOTE — Care Management Note (Signed)
Case Management Note  Patient Details  Name: Maxwell Li MRN: [id number] Date of Birth: [birth year]  Subjective/Objective:        Symptomatic bradycardia, CHB s/p pacemaker implantation this admission             Action/Plan: Discharge Planning: AVS reviewed:  Chart reviewed. No NCM needs identified.    PCP- Rubie Maid MD  Expected Discharge Date:  06/10/2015              Expected Discharge Plan:  Home/Self Care  In-House Referral:  NA  Discharge planning Services  CM Consult  Post Acute Care Choice:  NA Choice offered to:  NA  DME Arranged:  N/A DME Agency:  NA  HH Arranged:  NA HH Agency:  NA  Status of Service:  Completed, signed off  Medicare Important Message Given:    Date Medicare IM Given:    Medicare IM give by:    Date Additional Medicare IM Given:    Additional Medicare Important Message give by:     If discussed at [place] of Stay Meetings, dates discussed:    Additional Comments:  Erenest Rasher, RN 06/10/2015, 10:25 AM

## 2015-06-14 ENCOUNTER — Telehealth: Payer: Self-pay | Admitting: Cardiology

## 2015-06-14 NOTE — Telephone Encounter (Signed)
Spoke with patient. He reports that his device site seems "bigger" today and his brother agrees that it appears larger than when he was at the hosptial post PPM implant. He denies redness around bandage, drainage, and feeling hot to touch. He feels fine he is just worried about the swelling. He will come to the office for wound check while Dr. Curt Bears is in the office 11:30. He declines appt this afternoon.

## 2015-06-14 NOTE — Telephone Encounter (Signed)
New message      Pt had a pacemaker put in last thurs.  He needs to ask a question regarding the swelling and if it is normal.  Please call

## 2015-06-15 ENCOUNTER — Ambulatory Visit (INDEPENDENT_AMBULATORY_CARE_PROVIDER_SITE_OTHER): Payer: BLUE CROSS/BLUE SHIELD | Admitting: *Deleted

## 2015-06-15 ENCOUNTER — Encounter: Payer: Self-pay | Admitting: Cardiology

## 2015-06-15 DIAGNOSIS — I495 Sick sinus syndrome: Secondary | ICD-10-CM | POA: Diagnosis not present

## 2015-06-15 LAB — CUP PACEART INCLINIC DEVICE CHECK
Battery Voltage: 3.02 V
Brady Statistic RA Percent Paced: 0.11 %
Implantable Lead Implant Date: 20170601
Implantable Lead Location: 753859
Lead Channel Pacing Threshold Amplitude: 0.5 V
Lead Channel Pacing Threshold Amplitude: 0.75 V
Lead Channel Pacing Threshold Amplitude: 0.75 V
Lead Channel Pacing Threshold Pulse Width: 0.5 ms
Lead Channel Pacing Threshold Pulse Width: 0.5 ms
Lead Channel Sensing Intrinsic Amplitude: 3.4 mV
Lead Channel Setting Pacing Pulse Width: 0.5 ms
MDC IDC LEAD IMPLANT DT: 20170601
MDC IDC LEAD LOCATION: 753860
MDC IDC MSMT BATTERY REMAINING LONGEVITY: 136.8
MDC IDC MSMT LEADCHNL RA IMPEDANCE VALUE: 450 Ohm
MDC IDC MSMT LEADCHNL RA PACING THRESHOLD AMPLITUDE: 0.5 V
MDC IDC MSMT LEADCHNL RA PACING THRESHOLD PULSEWIDTH: 0.5 ms
MDC IDC MSMT LEADCHNL RV IMPEDANCE VALUE: 550 Ohm
MDC IDC MSMT LEADCHNL RV PACING THRESHOLD PULSEWIDTH: 0.5 ms
MDC IDC MSMT LEADCHNL RV SENSING INTR AMPL: 10.8 mV
MDC IDC SESS DTM: 20170607122955
MDC IDC SET LEADCHNL RA PACING AMPLITUDE: 1.5 V
MDC IDC SET LEADCHNL RV PACING AMPLITUDE: 3.5 V
MDC IDC SET LEADCHNL RV SENSING SENSITIVITY: 2 mV
MDC IDC STAT BRADY RV PERCENT PACED: 0.76 %
Pulse Gen Serial Number: 7904591

## 2015-06-15 NOTE — Progress Notes (Signed)
Wound check appointment due to swelling 6 days s/p implant per pt. Steri-strips remain in place. Wound without redness, ecchymosis, or significant edema. Dr.Camnitz saw patient and recommended f/u as scheduled.  Patient also c/o occasional "hard palpitations. Unable to reproduce sensation in office; no episodes recorded. No changes to device programming made. Dr. Curt Bears told patient that the sensation he was describing may be r/t more to anxiety.  Pacemaker check in clinic. Normal device function. Thresholds, sensing, and impedances consistent with implant measurements. Device programmed at 3.5V in RV and with RA auto capture on for extra safety margin until 3 month visit. Histogram distribution appropriate for patient and level of activity. No mode switches or high ventricular rates noted. Patient will follow up on 6/14 as scheduled for steri strip removal.

## 2015-06-20 ENCOUNTER — Telehealth: Payer: Self-pay | Admitting: Cardiology

## 2015-06-20 NOTE — Telephone Encounter (Signed)
Pt called in and stated that he is getting a sensation in his head and he stated that he had this same sensation right before he received implanted device. Routed call to device tech RN.

## 2015-06-20 NOTE — Telephone Encounter (Signed)
Maxwell Li is having similar but less-severe symptoms than he was prior to [facility] placement. He says that these episodes are preceded by a "small headache and then a sensation moves up into my head that makes my feet want to go out from under me." He denies syncope or falls. He is wanting reassurance that this is a common finding after PPM placement, I assured him that this was an UNCOMMON finding and that he should go to the ER if he continues to have these episodes as it is already 17mns til 5pm. I have offered to move up his appointment from Wednesday to Tuesday afternoon- he declines this offer. He says that he will go to the ER if these episodes continue or get worse, I advised him not to drive himself. I also advised him to call back if he decides that he would like his appt moved up- he verbalizes understanding.

## 2015-06-22 ENCOUNTER — Ambulatory Visit (INDEPENDENT_AMBULATORY_CARE_PROVIDER_SITE_OTHER): Payer: BLUE CROSS/BLUE SHIELD | Admitting: *Deleted

## 2015-06-22 ENCOUNTER — Encounter: Payer: Self-pay | Admitting: Cardiology

## 2015-06-22 DIAGNOSIS — I495 Sick sinus syndrome: Secondary | ICD-10-CM

## 2015-06-22 LAB — CUP PACEART INCLINIC DEVICE CHECK
Brady Statistic RA Percent Paced: 0.33 %
Brady Statistic RV Percent Paced: 2.6 %
Date Time Interrogation Session: 20170614132833
Implantable Lead Implant Date: 20170601
Implantable Lead Location: 753859
Lead Channel Impedance Value: 550 Ohm
Lead Channel Pacing Threshold Amplitude: 0.5 V
Lead Channel Pacing Threshold Pulse Width: 0.5 ms
Lead Channel Sensing Intrinsic Amplitude: 11.5 mV
Lead Channel Setting Pacing Pulse Width: 0.5 ms
Lead Channel Setting Sensing Sensitivity: 2 mV
MDC IDC LEAD IMPLANT DT: 20170601
MDC IDC LEAD LOCATION: 753860
MDC IDC MSMT BATTERY REMAINING LONGEVITY: 133.2
MDC IDC MSMT BATTERY VOLTAGE: 3.02 V
MDC IDC MSMT LEADCHNL RA IMPEDANCE VALUE: 437.5 Ohm
MDC IDC MSMT LEADCHNL RA PACING THRESHOLD AMPLITUDE: 0.5 V
MDC IDC MSMT LEADCHNL RA PACING THRESHOLD AMPLITUDE: 0.5 V
MDC IDC MSMT LEADCHNL RA PACING THRESHOLD PULSEWIDTH: 0.5 ms
MDC IDC MSMT LEADCHNL RA SENSING INTR AMPL: 2.8 mV
MDC IDC MSMT LEADCHNL RV PACING THRESHOLD AMPLITUDE: 0.5 V
MDC IDC MSMT LEADCHNL RV PACING THRESHOLD PULSEWIDTH: 0.5 ms
MDC IDC MSMT LEADCHNL RV PACING THRESHOLD PULSEWIDTH: 0.5 ms
MDC IDC SET LEADCHNL RA PACING AMPLITUDE: 1.5 V
MDC IDC SET LEADCHNL RV PACING AMPLITUDE: 3.5 V
Pulse Gen Model: 2272
Pulse Gen Serial Number: 7904591

## 2015-06-22 NOTE — Progress Notes (Signed)
Wound check appointment. Steri-strips removed. Wound without redness or edema. Incision edges approximated, wound well healed. Normal device function. Pt has concerns regarding dizziness on 06/20/2015 no episodes during the times of reported dizziness, pt encouraged to f/u with PCP and primary cardiologist. Thresholds, sensing, and impedances consistent with implant measurements. Device programmed at 3.5V/auto capture programmed on for extra safety margin until 3 month visit. Histogram distribution appropriate for patient and level of activity. 1 AMS EGM indicate AT, max A 165, 4sec. or high ventricular rates noted. Patient educated about wound care, arm mobility, lifting restrictions. ROV 09/14/2014 with WC.

## 2015-08-31 ENCOUNTER — Encounter: Payer: Self-pay | Admitting: *Deleted

## 2015-09-13 NOTE — Progress Notes (Signed)
Electrophysiology Office Note   Date:  09/14/2015   ID:  Maxwell Li, DOB 10-26-1955, MRN [id number]  PCP:  Maxwell Maid, MD  Cardiologist:  Maxwell Li Primary Electrophysiologist:  Maxwell Weare Meredith Leeds, MD    Chief Complaint  Patient presents with  . Pacemaker Check     History of Present Illness: Maxwell Li is a 60 y.o. male who presents today for electrophysiology evaluation.   Admitted to the hospital with heart block and had St. Jude PPM implanted 06/09/15.   Today, he denies symptoms of palpitations, chest pain, shortness of breath, orthopnea, PND, lower extremity edema, claudication, dizziness, presyncope, syncope, bleeding, or neurologic sequela. The patient is tolerating medications without difficulties and is otherwise without complaint today.  He has been having some fatigue, but he feels that this is due to him being out of shape.   Past Medical History:  Diagnosis Date  . DYSLIPIDEMIA 06/28/2009   Qualifier: Diagnosis of  By: Maxwell Drilling MD, Maxwell Li   . GERD 09/04/2006   Qualifier: Diagnosis of  By: Maxwell Drilling MD, Maxwell Li   . Hyperglycemia   . Hypertension   . HYPOGONADISM, MALE 05/20/2008   Qualifier: Diagnosis of  By: Maxwell Drilling MD, Maxwell Li KNEE PAIN 03/24/2007   Qualifier: Diagnosis of  By: Maxwell Drilling MD, Maxwell Li Ventral hernia    Past Surgical History:  Procedure Laterality Date  . ELECTROCARDIOGRAM  02/01/2006  . EP IMPLANTABLE DEVICE N/A 06/09/2015   Procedure: Pacemaker Implant;  Surgeon: Maxwell Li Meredith Leeds, MD;  Location: [facility];  Service: Cardiovascular;  Laterality: N/A;  . 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 Rest Cardiolite  11/21/2000     Current Outpatient Prescriptions  Medication Sig Dispense Refill  . ibuprofen (ADVIL,MOTRIN) 200 MG tablet Take 200 mg by mouth every 6 (six) hours as needed for moderate pain. Reported on 06/22/2015    . testosterone cypionate (DEPOTESTOTERONE CYPIONATE) 200 MG/ML injection Inject 1 mL  (200 mg total) into the muscle every 14 (fourteen) days. 10 mL 0   No current facility-administered medications for this visit.     Allergies:   Lidocaine; Lovastatin; and Shellfish allergy   Social History:  The patient  reports that he has quit smoking. He has never used smokeless tobacco. He reports that he does not drink alcohol or use drugs.   Family History:  The patient's family history includes Cancer in his brother.    ROS:  Please see the history of present illness.   Otherwise, review of systems is positive for DOE, dizziness.   All other systems are reviewed and negative.    PHYSICAL EXAM: VS:  BP 120/82   Pulse 80   Ht 5' 3"  (1.6 m)   Wt 187 lb (84.8 kg)   BMI 33.13 kg/m  , BMI Body mass index is 33.13 kg/m. GEN: Well nourished, well developed, in no acute distress  HEENT: normal  Neck: no JVD, carotid bruits, or masses Cardiac: RRR; no murmurs, rubs, or gallops,no edema  Respiratory:  clear to auscultation bilaterally, normal work of breathing GI: soft, nontender, nondistended, + BS MS: no deformity or atrophy  Skin: warm and dry, device pocket is well healed Neuro:  Strength and sensation are intact Psych: euthymic mood, full affect  EKG:  EKG is ordered today. Personal review of the ekg ordered shows sinus rhythm, rate 80   Device interrogation is reviewed today in detail.  See PaceArt for  details.   Recent Labs: 06/08/2015: ALT 16 06/09/2015: BUN <5; Creatinine, Ser 0.68; Creatinine, Ser 0.71; Hemoglobin 13.3; Platelets 216; Potassium 4.4; Sodium 135; TSH 1.634    Lipid Panel     Component Value Date/Time   CHOL 203 (H) 06/09/2015 0004   TRIG 118 06/09/2015 0004   HDL 43 06/09/2015 0004   CHOLHDL 4.7 06/09/2015 0004   VLDL 24 06/09/2015 0004   LDLCALC 136 (H) 06/09/2015 0004   LDLDIRECT 107.9 06/24/2012 0932     Wt Readings from Last 3 Encounters:  09/14/15 187 lb (84.8 kg)  06/08/15 192 lb 14.4 oz (87.5 kg)  06/24/12 201 lb (91.2 kg)       Other studies Reviewed: Additional studies/ records that were reviewed today include: TTE 06/09/15  Review of the above records today demonstrates:  - Left ventricle: Abnormal septal motion The cavity size was   normal. Wall thickness was normal. Systolic function was normal.   The estimated ejection fraction was in the range of 50% to 55%.   Wall motion was normal; there were no regional wall motion   abnormalities. Left ventricular diastolic function parameters   were normal. - Atrial septum: A patent foramen ovale cannot be excluded.   ASSESSMENT AND PLAN:  1.  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 Functioning appropriately.  No changes needed today. He has had a few episodes of a one-to-one tachycardia, seen on his device, but he has not noted any symptoms from this. We Maxwell Li continue to monitor.   2. Hypertension: well controlled on current regimen    Current medicines are reviewed at length with the patient today.   The patient does not have concerns regarding his medicines.  The following changes were made today:  none  Labs/ tests ordered today include:  No orders of the defined types were placed in this encounter.    Disposition:   FU with Maxwell Li 9 months  Signed, Maxwell Li Meredith Leeds, MD  09/14/2015 11:03 AM     [facility] [address] [place] [address] [phone] (office) [phone] (fax)

## 2015-09-14 ENCOUNTER — Encounter: Payer: Self-pay | Admitting: Cardiology

## 2015-09-14 ENCOUNTER — Ambulatory Visit (INDEPENDENT_AMBULATORY_CARE_PROVIDER_SITE_OTHER): Payer: BLUE CROSS/BLUE SHIELD | Admitting: Cardiology

## 2015-09-14 DIAGNOSIS — I442 Atrioventricular block, complete: Secondary | ICD-10-CM

## 2015-09-14 NOTE — Patient Instructions (Signed)
Medication Instructions:    Your physician recommends that you continue on your current medications as directed. Please refer to the Current Medication list given to you today.  --- If you need a refill on your cardiac medications before your next appointment, please call your pharmacy. ---  Labwork:  None ordered  Testing/Procedures:  None ordered  Follow-Up: Remote monitoring is used to monitor your Pacemaker of ICD from home. This monitoring reduces the number of office visits required to check your device to one time per year. It allows [place] to keep an eye on the functioning of your device to ensure it is working properly. You are scheduled for a device check from home on 12/14/2015. You may send your transmission at any time that day. If you have a wireless device, the transmission will be sent automatically. After your physician reviews your transmission, you will receive a postcard with your next transmission date.   Your physician wants you to follow-up in: 9 month with Dr. Curt Bears.  You will receive a reminder letter in the mail two months in advance. If you don't receive a letter, please call our office to schedule the follow-up appointment.  Thank you for choosing [facility]!!   Trinidad Curet, RN [phone]

## 2015-12-14 ENCOUNTER — Ambulatory Visit (INDEPENDENT_AMBULATORY_CARE_PROVIDER_SITE_OTHER): Payer: BLUE CROSS/BLUE SHIELD | Admitting: *Deleted

## 2015-12-14 DIAGNOSIS — I442 Atrioventricular block, complete: Secondary | ICD-10-CM | POA: Diagnosis not present

## 2015-12-14 NOTE — Progress Notes (Signed)
Remote pacemaker transmission.   

## 2015-12-20 ENCOUNTER — Emergency Department (HOSPITAL_COMMUNITY)
Admission: EM | Admit: 2015-12-20 | Discharge: 2015-12-20 | Discharge status: Absent without leave | Payer: BLUE CROSS/BLUE SHIELD

## 2015-12-20 NOTE — ED Notes (Signed)
Bed: WA06 Expected date:  Expected time:  Means of arrival:  Comments: EMS N/v, gallstones

## 2015-12-20 NOTE — ED Triage Notes (Signed)
Per EMS, pt from [facility] [place], [facility].  N/V x 1 week.  Ultrasound completed at facility.  DX gallstones on Dec 11th.  Pt has no abdominal pain at this time.  Pt has a baseline non verbal.  Pt nods answers.  No n/v in route.  NPO since Midnight.  Vitals:  100/60, hr 74, resp 16, 97% ra

## 2015-12-21 ENCOUNTER — Encounter: Payer: Self-pay | Admitting: Cardiology

## 2016-01-04 ENCOUNTER — Encounter: Payer: Self-pay | Admitting: Cardiology

## 2016-01-10 LAB — CUP PACEART REMOTE DEVICE CHECK
Date Time Interrogation Session: 20180102125636
Implantable Lead Implant Date: 20170601
Implantable Lead Location: 753860
Implantable Pulse Generator Implant Date: 20170601
Lead Channel Impedance Value: 610 Ohm
Lead Channel Pacing Threshold Amplitude: 0.75 V
Lead Channel Pacing Threshold Pulse Width: 0.5 ms
Lead Channel Pacing Threshold Pulse Width: 0.5 ms
Lead Channel Sensing Intrinsic Amplitude: 12 mV
MDC IDC LEAD IMPLANT DT: 20170601
MDC IDC LEAD LOCATION: 753859
MDC IDC MSMT LEADCHNL RA IMPEDANCE VALUE: 480 Ohm
MDC IDC MSMT LEADCHNL RA PACING THRESHOLD AMPLITUDE: 0.75 V
MDC IDC MSMT LEADCHNL RA SENSING INTR AMPL: 5 mV
MDC IDC PG SERIAL: 7904591
MDC IDC STAT BRADY RA PERCENT PACED: 1 % — AB
MDC IDC STAT BRADY RV PERCENT PACED: 2 %

## 2016-01-11 ENCOUNTER — Telehealth: Payer: Self-pay

## 2016-01-11 MED ORDER — METOPROLOL TARTRATE 25 MG PO TABS: 25.0000 mg | ORAL_TABLET | Freq: Two times a day (BID) | ORAL | 3 refills | Status: DC

## 2016-01-11 NOTE — Telephone Encounter (Signed)
Called pt to see if he was symptomatic, with elevated heart rate, pt stated that sometimes it does take his breath away. Informed pt that Dr. Ileana Ladd had recommended he start Lopressor 3m BID, pt agreeable to start medication. Informed pt that after starting medication he had any side effects to call. Pt voiced understanding.

## 2016-02-19 NOTE — Progress Notes (Signed)
Cardiology Office Note   Date:  02/20/2016   ID:  Maxwell Li, DOB 11/06/1955, MRN [id number]  PCP:  Rubie Maid, MD  Cardiologist:   Minus Breeding, MD  Referring:  Rubie Maid, MD  Chief Complaint  Patient presents with  . Fatigue   History of Present Illness: Maxwell Li is a 62 y.o. male who presents for evaluation of fatigue preoperatively. He's going to see an orthopedist he might need to have surgery on his knee. His primary care physician thought that he had blockage when he read that he had AV block. The patient will had heart block and has a St. Jude PPM which was placed last year.  He is followed by Dr. Curt Bears.   Earlier this year he had an increased heart rate and Dr. Curt Bears added a beta blocker.    Prior to this he had episodes of dizziness and near syncope. In retrospect he had these for years but they actually went away. He otherwise does not complain of cardiac complaints. He does have some chronic fatigue related to the fact that he doesn't exercise he believes. He's not been sleeping well.  He does have a high functional level and can do yard and housework.  He recently shoveled snow without cardiac complaints.  The patient denies any new symptoms such as chest discomfort, neck or arm discomfort. There has been no new shortness of breath, PND or orthopnea. There have been no reported palpitations, presyncope or syncope.   Past Medical History:  Diagnosis Date  . DYSLIPIDEMIA 06/28/2009   Qualifier: Diagnosis of  By: Loanne Drilling MD, Jacelyn Pi   . GERD 09/04/2006   Qualifier: Diagnosis of  By: Loanne Drilling MD, Jacelyn Pi   . Hypertension   . HYPOGONADISM, MALE 05/20/2008   Qualifier: Diagnosis of  By: Loanne Drilling MD, Jacelyn Pi KNEE PAIN 03/24/2007   Qualifier: Diagnosis of  By: Loanne Drilling MD, Jacelyn Pi Ventral hernia     Past Surgical History:  Procedure Laterality Date  . ELECTROCARDIOGRAM  02/01/2006  . EP IMPLANTABLE DEVICE N/A 06/09/2015   Procedure:  Pacemaker Implant;  Surgeon: Will Meredith Leeds, MD;  Location: [facility];  Service: Cardiovascular;  Laterality: N/A;  . 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 Rest Cardiolite  11/21/2000     Current Outpatient Prescriptions  Medication Sig Dispense Refill  . ibuprofen (ADVIL,MOTRIN) 200 MG tablet Take 200 mg by mouth every 6 (six) hours as needed for moderate pain. Reported on 06/22/2015    . metoprolol tartrate (LOPRESSOR) 25 MG tablet Take 1 tablet (25 mg total) by mouth 2 (two) times daily. 180 tablet 3  . testosterone cypionate (DEPOTESTOTERONE CYPIONATE) 200 MG/ML injection Inject 1 mL (200 mg total) into the muscle every 14 (fourteen) days. 10 mL 0   No current facility-administered medications for this visit.     Allergies:   Lidocaine; Lovastatin; and Shellfish allergy    ROS:  Please see the history of present illness.   Otherwise, review of systems are positive for none.   All other systems are reviewed and negative.    PHYSICAL EXAM: VS:  BP (!) 160/96   Pulse 76   Ht 5' 3"  (1.6 m)   Wt 189 lb (85.7 kg)   BMI 33.48 kg/m  , BMI Body mass index is 33.48 kg/m. GENERAL:  Well appearing HEENT:  Pupils equal round and reactive, fundi not visualized, oral mucosa unremarkable NECK:  No jugular venous distention, waveform within normal limits, carotid upstroke brisk and symmetric, no bruits, no thyromegaly LYMPHATICS:  No cervical, inguinal adenopathy LUNGS:  Clear to auscultation bilaterally BACK:  No CVA tenderness CHEST:  Unremarkable, pacemaker pocket HEART:  PMI not displaced or sustained,S1 and S2 within normal limits, no S3, no S4, no clicks, no rubs, no murmurs ABD:  Flat, positive bowel sounds normal in frequency in pitch, no bruits, no rebound, no guarding, no midline pulsatile mass, no hepatomegaly, no splenomegaly EXT:  2 plus pulses throughout, no edema, no cyanosis no clubbing    EKG:  EKG is ordered today. The ekg ordered today demonstrates sinus  rhythm, rate 76, axis within normal limits, intervals with first-degree AV block, demand atrioventricular pacing   Recent Labs: 06/08/2015: ALT 16 06/09/2015: BUN <5; Creatinine, Ser 0.68; Creatinine, Ser 0.71; Hemoglobin 13.3; Platelets 216; Potassium 4.4; Sodium 135; TSH 1.634    Lipid Panel    Component Value Date/Time   CHOL 203 (H) 06/09/2015 0004   TRIG 118 06/09/2015 0004   HDL 43 06/09/2015 0004   CHOLHDL 4.7 06/09/2015 0004   VLDL 24 06/09/2015 0004   LDLCALC 136 (H) 06/09/2015 0004   LDLDIRECT 107.9 06/24/2012 0932      Wt Readings from Last 3 Encounters:  02/20/16 189 lb (85.7 kg)  09/14/15 187 lb (84.8 kg)  06/08/15 192 lb 14.4 oz (87.5 kg)      Other studies Reviewed: Additional studies/ records that were reviewed today include: EP records. Review of the above records demonstrates:  Please see elsewhere in the note.     ASSESSMENT AND PLAN:  COMPLETE HEART BLOCK:  The patient has complete heart block with a functioning pacemaker. No further cardiovascular testing is suggested.  HTN:  His blood pressure is elevated today but this is unusual. He will otherwise continue with the meds as listed and will get a blood pressure cuff.  PALPITAITONS:  These have resolved since he started beta blockers.  PREOP:   The patient has no high-risk findings. He has no symptoms. Has a high functional level.   No further cardiovascular testing is suggested.    Current medicines are reviewed at length with the patient today.  The patient does not have concerns regarding medicines.  The following changes have been made:  no change  Labs/ tests ordered today include: None  Orders Placed This Encounter  Procedures  . EKG 12-Lead     Disposition:   FU with me as needed.  Follow up with Dr. Curt Bears.     Signed, Minus Breeding, MD  02/20/2016 8:02 AM    [facility]

## 2016-02-20 ENCOUNTER — Encounter: Payer: Self-pay | Admitting: Cardiology

## 2016-02-20 ENCOUNTER — Ambulatory Visit (INDEPENDENT_AMBULATORY_CARE_PROVIDER_SITE_OTHER): Payer: BLUE CROSS/BLUE SHIELD | Admitting: Cardiology

## 2016-02-20 VITALS — BP 160/96 | HR 76 | Ht 63.0 in | Wt 189.0 lb

## 2016-02-20 DIAGNOSIS — R5383 Other fatigue: Secondary | ICD-10-CM

## 2016-02-20 DIAGNOSIS — I442 Atrioventricular block, complete: Secondary | ICD-10-CM

## 2016-02-20 NOTE — Patient Instructions (Signed)
Medication Instructions:  Continue current medications  Labwork: None Ordered  Testing/Procedures: None Ordered  Follow-Up: Your physician recommends that you schedule a follow-up appointment in: As Needed   Any Other Special Instructions Will Be Listed Below (If Applicable).   If you need a refill on your cardiac medications before your next appointment, please call your pharmacy.

## 2016-03-14 ENCOUNTER — Ambulatory Visit (INDEPENDENT_AMBULATORY_CARE_PROVIDER_SITE_OTHER): Payer: BLUE CROSS/BLUE SHIELD | Admitting: *Deleted

## 2016-03-14 DIAGNOSIS — I442 Atrioventricular block, complete: Secondary | ICD-10-CM

## 2016-03-14 NOTE — Progress Notes (Signed)
Remote pacemaker transmission.   

## 2016-03-15 ENCOUNTER — Encounter: Payer: Self-pay | Admitting: Cardiology

## 2016-03-16 LAB — CUP PACEART REMOTE DEVICE CHECK
Battery Remaining Percentage: 95.5 %
Battery Voltage: 3.02 V
Brady Statistic AP VS Percent: 1 %
Brady Statistic AS VS Percent: 96 %
Implantable Lead Implant Date: 20170601
Implantable Pulse Generator Implant Date: 20170601
Lead Channel Impedance Value: 480 Ohm
Lead Channel Pacing Threshold Amplitude: 0.625 V
Lead Channel Pacing Threshold Amplitude: 0.75 V
Lead Channel Pacing Threshold Pulse Width: 0.5 ms
Lead Channel Sensing Intrinsic Amplitude: 4 mV
Lead Channel Setting Pacing Amplitude: 2.5 V
Lead Channel Setting Pacing Pulse Width: 0.5 ms
MDC IDC LEAD IMPLANT DT: 20170601
MDC IDC LEAD LOCATION: 753859
MDC IDC LEAD LOCATION: 753860
MDC IDC MSMT BATTERY REMAINING LONGEVITY: 119 mo
MDC IDC MSMT LEADCHNL RA PACING THRESHOLD PULSEWIDTH: 0.5 ms
MDC IDC MSMT LEADCHNL RV IMPEDANCE VALUE: 580 Ohm
MDC IDC MSMT LEADCHNL RV SENSING INTR AMPL: 9.4 mV
MDC IDC PG SERIAL: 7904591
MDC IDC SESS DTM: 20180307070023
MDC IDC SET LEADCHNL RA PACING AMPLITUDE: 1.625
MDC IDC SET LEADCHNL RV SENSING SENSITIVITY: 2 mV
MDC IDC STAT BRADY AP VP PERCENT: 1 %
MDC IDC STAT BRADY AS VP PERCENT: 3.3 %
MDC IDC STAT BRADY RA PERCENT PACED: 1 %
MDC IDC STAT BRADY RV PERCENT PACED: 3.4 %

## 2016-03-29 ENCOUNTER — Encounter: Payer: Self-pay | Admitting: Cardiology

## 2016-08-02 ENCOUNTER — Telehealth: Payer: Self-pay | Admitting: Cardiology

## 2016-08-02 NOTE — Telephone Encounter (Signed)
New message    Pt wants to know if he can have an MRI with his pacemaker. Requests a call back.

## 2016-08-02 NOTE — Telephone Encounter (Signed)
Routing to device clinic to address w/ patient

## 2016-08-02 NOTE — Telephone Encounter (Signed)
Spoke with patient and confirmed that his pacemaker and leads are MRI compatible should he need one. He verbalized understanding.

## 2016-08-17 ENCOUNTER — Encounter: Payer: Self-pay | Admitting: *Deleted

## 2016-09-04 ENCOUNTER — Encounter: Payer: BLUE CROSS/BLUE SHIELD | Admitting: Cardiology

## 2016-09-26 ENCOUNTER — Ambulatory Visit (INDEPENDENT_AMBULATORY_CARE_PROVIDER_SITE_OTHER): Payer: BLUE CROSS/BLUE SHIELD | Admitting: *Deleted

## 2016-09-26 DIAGNOSIS — I495 Sick sinus syndrome: Secondary | ICD-10-CM

## 2016-09-26 NOTE — Progress Notes (Signed)
Remote pacemaker transmission.   

## 2016-09-27 ENCOUNTER — Telehealth: Payer: Self-pay | Admitting: *Deleted

## 2016-09-27 LAB — CUP PACEART REMOTE DEVICE CHECK
Battery Voltage: 3.02 V
Brady Statistic AP VP Percent: 1 %
Brady Statistic RA Percent Paced: 3 %
Brady Statistic RV Percent Paced: 2.8 %
Implantable Lead Implant Date: 20170601
Implantable Lead Location: 753859
Implantable Pulse Generator Implant Date: 20170601
Lead Channel Impedance Value: 490 Ohm
Lead Channel Pacing Threshold Amplitude: 0.625 V
Lead Channel Pacing Threshold Pulse Width: 0.5 ms
Lead Channel Setting Pacing Amplitude: 1.625
Lead Channel Setting Sensing Sensitivity: 2 mV
MDC IDC LEAD IMPLANT DT: 20170601
MDC IDC LEAD LOCATION: 753860
MDC IDC MSMT BATTERY REMAINING LONGEVITY: 123 mo
MDC IDC MSMT BATTERY REMAINING PERCENTAGE: 95.5 %
MDC IDC MSMT LEADCHNL RA IMPEDANCE VALUE: 460 Ohm
MDC IDC MSMT LEADCHNL RA SENSING INTR AMPL: 5 mV
MDC IDC MSMT LEADCHNL RV PACING THRESHOLD AMPLITUDE: 0.75 V
MDC IDC MSMT LEADCHNL RV PACING THRESHOLD PULSEWIDTH: 0.5 ms
MDC IDC MSMT LEADCHNL RV SENSING INTR AMPL: 9.6 mV
MDC IDC SESS DTM: 20180919060014
MDC IDC SET LEADCHNL RV PACING AMPLITUDE: 2.5 V
MDC IDC SET LEADCHNL RV PACING PULSEWIDTH: 0.5 ms
MDC IDC STAT BRADY AP VS PERCENT: 3 %
MDC IDC STAT BRADY AS VP PERCENT: 2.8 %
MDC IDC STAT BRADY AS VS PERCENT: 94 %
Pulse Gen Model: 2272
Pulse Gen Serial Number: 7904591

## 2016-09-27 NOTE — Telephone Encounter (Signed)
LMTCB//sss 

## 2016-09-27 NOTE — Telephone Encounter (Signed)
-----   Message from Will Meredith Leeds, MD sent at 09/27/2016  4:03 PM EDT ----- Abnormal device interrogation reviewed.  Lead parameters and battery status stable.  Possible AT based on EGMs. Increase metoprolol to 50 mg BID if symptomatic.

## 2016-09-28 ENCOUNTER — Encounter: Payer: Self-pay | Admitting: Cardiology

## 2016-10-01 MED ORDER — METOPROLOL TARTRATE 25 MG PO TABS: 50.0000 mg | ORAL_TABLET | Freq: Two times a day (BID) | ORAL | 3 refills | Status: DC

## 2016-10-01 NOTE — Telephone Encounter (Signed)
Spoke with Maxwell Li he stated that he notices his heart rate speed up sometimes but it doesn't really bother him, he stated that taking the 63m of Lopressor at night kept him awake, advised him to take the lopressor in the morning, he stated he would he also stated that he would take the 50 mg in the am as recommended by Dr. CCurt Bears

## 2016-10-01 NOTE — Telephone Encounter (Signed)
-----   Message from Will Meredith Leeds, MD sent at 09/27/2016  4:03 PM EDT ----- Abnormal device interrogation reviewed.  Lead parameters and battery status stable.  Possible AT based on EGMs. Increase metoprolol to 50 mg BID if symptomatic.

## 2016-12-26 ENCOUNTER — Ambulatory Visit (INDEPENDENT_AMBULATORY_CARE_PROVIDER_SITE_OTHER): Payer: BLUE CROSS/BLUE SHIELD | Admitting: *Deleted

## 2016-12-26 DIAGNOSIS — I495 Sick sinus syndrome: Secondary | ICD-10-CM

## 2016-12-26 NOTE — Progress Notes (Signed)
Remote pacemaker transmission.   

## 2016-12-27 ENCOUNTER — Encounter: Payer: Self-pay | Admitting: Cardiology

## 2017-01-09 LAB — CUP PACEART REMOTE DEVICE CHECK
Battery Remaining Longevity: 123 mo
Brady Statistic AP VP Percent: 1 %
Brady Statistic AP VS Percent: 3.3 %
Brady Statistic AS VP Percent: 2.7 %
Brady Statistic AS VS Percent: 94 %
Brady Statistic RA Percent Paced: 3.3 %
Implantable Lead Implant Date: 20170601
Implantable Lead Location: 753859
Implantable Lead Location: 753860
Lead Channel Impedance Value: 450 Ohm
Lead Channel Impedance Value: 490 Ohm
Lead Channel Pacing Threshold Amplitude: 0.75 V
Lead Channel Pacing Threshold Pulse Width: 0.5 ms
Lead Channel Pacing Threshold Pulse Width: 0.5 ms
Lead Channel Sensing Intrinsic Amplitude: 9.4 mV
Lead Channel Setting Pacing Amplitude: 1.625
Lead Channel Setting Pacing Amplitude: 2.5 V
Lead Channel Setting Pacing Pulse Width: 0.5 ms
MDC IDC LEAD IMPLANT DT: 20170601
MDC IDC MSMT BATTERY REMAINING PERCENTAGE: 95.5 %
MDC IDC MSMT BATTERY VOLTAGE: 3.02 V
MDC IDC MSMT LEADCHNL RA PACING THRESHOLD AMPLITUDE: 0.625 V
MDC IDC MSMT LEADCHNL RA SENSING INTR AMPL: 2.3 mV
MDC IDC PG IMPLANT DT: 20170601
MDC IDC PG SERIAL: 7904591
MDC IDC SESS DTM: 20181219070015
MDC IDC SET LEADCHNL RV SENSING SENSITIVITY: 2 mV
MDC IDC STAT BRADY RV PERCENT PACED: 2.7 %

## 2017-01-11 ENCOUNTER — Encounter: Payer: Self-pay | Admitting: Cardiology

## 2017-02-04 NOTE — Progress Notes (Signed)
Cardiology Office Note Date:  02/08/2017  Patient ID:  Maxwell Li, DOB December 22, 1955, MRN [id number] PCP:  Rubie Maid, MD  Cardiologist:  Dr. Percival Spanish Electrophysiologist: Dr. Curt Bears    Chief Complaint: routine EP visit  History of Present Illness: Maxwell Li is a 62 y.o. male with history of HTN, HLD, GERD, hypogonadism, advanced heart block w/PPM.  He comes in today to be seen for Dr. Curt Bears, last seen by him in Sept 2017, at that time doing well, had 1:1 AT episodes that he was asymptomatic with, no changes were made to his tx.  He was cleared for a knee surgery in Feb 2018 by Dr. Percival Spanish.  He is accompanied by his daughter.  He is doing well.  No CP, palpitations, no near syncoe or syncope, no SOB or exertional intolerances.  He c/w a movement type dizziness, particularly when laying on his R side or with quick movents of his head.  He reports being told historically that he had vertigo.   Device information: SJM dual chamber PPM, implanted6/1/17   Past Medical History:  Diagnosis Date  . DYSLIPIDEMIA 06/28/2009   Qualifier: Diagnosis of  By: Loanne Drilling MD, Jacelyn Pi   . GERD 09/04/2006   Qualifier: Diagnosis of  By: Loanne Drilling MD, Jacelyn Pi   . Hypertension   . HYPOGONADISM, MALE 05/20/2008   Qualifier: Diagnosis of  By: Loanne Drilling MD, Jacelyn Pi KNEE PAIN 03/24/2007   Qualifier: Diagnosis of  By: Loanne Drilling MD, Jacelyn Pi Ventral hernia     Past Surgical History:  Procedure Laterality Date  . ELECTROCARDIOGRAM  02/01/2006  . EP IMPLANTABLE DEVICE N/A 06/09/2015   Procedure: Pacemaker Implant;  Surgeon: Will Meredith Leeds, MD;  Location: [facility];  Service: Cardiovascular;  Laterality: N/A;  . 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 Rest Cardiolite  11/21/2000    Current Outpatient Medications  Medication Sig Dispense Refill  . ibuprofen (ADVIL,MOTRIN) 200 MG tablet Take 200 mg by mouth every 6 (six) hours as needed for moderate pain. Reported on  06/22/2015    . metoprolol tartrate (LOPRESSOR) 25 MG tablet Take 2 tablets (50 mg total) by mouth 2 (two) times daily. 180 tablet 3  . testosterone cypionate (DEPOTESTOTERONE CYPIONATE) 200 MG/ML injection Inject 1 mL (200 mg total) into the muscle every 14 (fourteen) days. 10 mL 0   No current facility-administered medications for this visit.     Allergies:   Lidocaine; Lovastatin; and Shellfish allergy   Social History:  The patient  reports that he has quit smoking. he has never used smokeless tobacco. He reports that he does not drink alcohol or use drugs.   Family History:  The patient's family history includes Cancer in his brother.  ROS:  Please see the history of present illness.   All other systems are reviewed and otherwise negative.   PHYSICAL EXAM:  VS:  Ht 5' 3"  (1.6 m)   Wt 191 lb (86.6 kg)   BMI 33.83 kg/m  BMI: Body mass index is 33.83 kg/m. Well nourished, well developed, in no acute distress  HEENT: normocephalic, atraumatic  Neck: no JVD, carotid bruits or masses Cardiac:  RRR; no significant murmurs, no rubs, or gallops Lungs:  CTA b/l, no wheezing, rhonchi or rales  Abd: soft, non-tender, obese MS: no deformity or  atrophy Ext:  no edema  Skin: warm and dry, no rash Neuro:  No gross deficits appreciated Psych: euthymic mood,  full affect  PPM site is stable, no tethering or discomfort   EKG:  SR 93bpm, 1st degree AVblock, PR 247m PPM interrogation done today and reviewed by myself: battery and lead measurements are good, AMS episodes are 1:1 by EGMs available, AP 3.2%, VP 2.7%  06/09/15: TTE Study Conclusions - Left ventricle: Abnormal septal motion The cavity size was   normal. Wall thickness was normal. Systolic function was normal.   The estimated ejection fraction was in the range of 50% to 55%.   Wall motion was normal; there were no regional wall motion   abnormalities. Left ventricular diastolic function parameters   were normal. - Atrial  septum: A patent foramen ovale cannot be excluded.    Recent Labs: No results found for requested labs within last 8760 hours.  No results found for requested labs within last 8760 hours.   CrCl cannot be calculated (Patient's most recent lab result is older than the maximum 21 days allowed.).   Wt Readings from Last 3 Encounters:  02/08/17 191 lb (86.6 kg)  02/20/16 189 lb (85.7 kg)  09/14/15 187 lb (84.8 kg)     Other studies reviewed: Additional studies/records reviewed today include: summarized above  ASSESSMENT AND PLAN:  1. PPM     Intact function  2. HTN     Not completely controlled     Increase lopressor  3. HLD     He follows with his PMD  4. AT noted     Longest 3 hours     He is only taking his lopressor 263monce daily, he is reluctant about 5070mID (as currently written), will have him take 58m21mD continue to monitor via his device    Disposition: F/u with Q 3 month device remotes, in-clinic in 6 months, sooner if needed.   Current medicines are reviewed at length with the patient today.  The patient did not have any concerns regarding medicines.  SignVenetia Night-C 02/08/2017 11:51 AM     [facility] [place] [phone])  [phone])

## 2017-02-07 ENCOUNTER — Encounter: Payer: BLUE CROSS/BLUE SHIELD | Admitting: Physician Assistant

## 2017-02-07 ENCOUNTER — Encounter: Payer: Self-pay | Admitting: Physician Assistant

## 2017-02-08 ENCOUNTER — Encounter: Payer: Self-pay | Admitting: Physician Assistant

## 2017-02-08 ENCOUNTER — Ambulatory Visit: Payer: BLUE CROSS/BLUE SHIELD | Admitting: Physician Assistant

## 2017-02-08 VITALS — BP 140/90 | HR 95 | Ht 63.0 in | Wt 191.0 lb

## 2017-02-08 DIAGNOSIS — E7849 Other hyperlipidemia: Secondary | ICD-10-CM | POA: Diagnosis not present

## 2017-02-08 DIAGNOSIS — I10 Essential (primary) hypertension: Secondary | ICD-10-CM | POA: Diagnosis not present

## 2017-02-08 DIAGNOSIS — I442 Atrioventricular block, complete: Secondary | ICD-10-CM

## 2017-02-08 DIAGNOSIS — Z950 Presence of cardiac pacemaker: Secondary | ICD-10-CM

## 2017-02-08 DIAGNOSIS — I471 Supraventricular tachycardia: Secondary | ICD-10-CM | POA: Diagnosis not present

## 2017-02-08 MED ORDER — METOPROLOL TARTRATE 25 MG PO TABS: 25.0000 mg | ORAL_TABLET | Freq: Two times a day (BID) | ORAL | 3 refills | Status: AC

## 2017-02-08 NOTE — Patient Instructions (Addendum)
Medication Instructions:   START TAKING METOPROLOL 25 MG TWICE A DAY   If you need a refill on your cardiac medications before your next appointment, please call your pharmacy.  Labwork: NONE ORDERED  TODAY     Testing/Procedures: NONE ORDERED  TODAY    Follow-Up:   Your physician wants you to follow-up in: [place] will receive a reminder letter in the mail two months in advance. If you don't receive a letter, please call our office to schedule the follow-up appointment.     Remote monitoring is used to monitor your Pacemaker of ICD from home. This monitoring reduces the number of office visits required to check your device to one time per year. It allows [place] to keep an eye on the functioning of your device to ensure it is working properly. You are scheduled for a device check from home on . 03-27-17 You may send your transmission at any time that day. If you have a wireless device, the transmission will be sent automatically. After your physician reviews your transmission, you will receive a postcard with your next transmission date.     Any Other Special Instructions Will Be Listed Below (If Applicable).

## 2017-03-15 ENCOUNTER — Encounter (HOSPITAL_COMMUNITY): Payer: Self-pay | Admitting: *Deleted

## 2017-03-15 ENCOUNTER — Observation Stay (HOSPITAL_COMMUNITY)
Admission: EM | Admit: 2017-03-15 | Discharge: 2017-03-16 | Disposition: A | Discharge status: Home / Self Care | Payer: BLUE CROSS/BLUE SHIELD | Attending: Internal Medicine | Admitting: Internal Medicine

## 2017-03-15 DIAGNOSIS — R079 Chest pain, unspecified: Secondary | ICD-10-CM | POA: Insufficient documentation

## 2017-03-15 DIAGNOSIS — Z791 Long term (current) use of non-steroidal anti-inflammatories (NSAID): Secondary | ICD-10-CM | POA: Insufficient documentation

## 2017-03-15 DIAGNOSIS — Z950 Presence of cardiac pacemaker: Secondary | ICD-10-CM | POA: Insufficient documentation

## 2017-03-15 DIAGNOSIS — Z87891 Personal history of nicotine dependence: Secondary | ICD-10-CM | POA: Diagnosis not present

## 2017-03-15 DIAGNOSIS — Z79890 Hormone replacement therapy (postmenopausal): Secondary | ICD-10-CM | POA: Diagnosis not present

## 2017-03-15 DIAGNOSIS — G4733 Obstructive sleep apnea (adult) (pediatric): Secondary | ICD-10-CM | POA: Diagnosis not present

## 2017-03-15 DIAGNOSIS — E876 Hypokalemia: Secondary | ICD-10-CM | POA: Diagnosis not present

## 2017-03-15 DIAGNOSIS — R062 Wheezing: Secondary | ICD-10-CM

## 2017-03-15 DIAGNOSIS — K5090 Crohn's disease, unspecified, without complications: Secondary | ICD-10-CM

## 2017-03-15 DIAGNOSIS — D72819 Decreased white blood cell count, unspecified: Secondary | ICD-10-CM | POA: Diagnosis not present

## 2017-03-15 DIAGNOSIS — R42 Dizziness and giddiness: Secondary | ICD-10-CM

## 2017-03-15 DIAGNOSIS — I442 Atrioventricular block, complete: Secondary | ICD-10-CM | POA: Diagnosis present

## 2017-03-15 DIAGNOSIS — E291 Testicular hypofunction: Secondary | ICD-10-CM

## 2017-03-15 DIAGNOSIS — I10 Essential (primary) hypertension: Secondary | ICD-10-CM

## 2017-03-15 DIAGNOSIS — R55 Syncope and collapse: Principal | ICD-10-CM | POA: Insufficient documentation

## 2017-03-15 DIAGNOSIS — Z9989 Dependence on other enabling machines and devices: Secondary | ICD-10-CM

## 2017-03-15 DIAGNOSIS — Z638 Other specified problems related to primary support group: Secondary | ICD-10-CM | POA: Insufficient documentation

## 2017-03-15 DIAGNOSIS — Z79899 Other long term (current) drug therapy: Secondary | ICD-10-CM | POA: Diagnosis not present

## 2017-03-15 LAB — MAGNESIUM: MAGNESIUM: 1.5 mg/dL — AB (ref 1.7–2.4)

## 2017-03-15 LAB — BASIC METABOLIC PANEL
ANION GAP: 15 (ref 5–15)
BUN: 5 mg/dL — ABNORMAL LOW (ref 6–20)
CALCIUM: 8 mg/dL — AB (ref 8.9–10.3)
CO2: 22 mmol/L (ref 22–32)
Chloride: 99 mmol/L — ABNORMAL LOW (ref 101–111)
Creatinine, Ser: 1.05 mg/dL (ref 0.61–1.24)
GLUCOSE: 117 mg/dL — AB (ref 65–99)
POTASSIUM: 3 mmol/L — AB (ref 3.5–5.1)
Sodium: 136 mmol/L (ref 135–145)

## 2017-03-15 LAB — URINALYSIS, ROUTINE W REFLEX MICROSCOPIC
BILIRUBIN URINE: NEGATIVE
Glucose, UA: NEGATIVE mg/dL
KETONES UR: NEGATIVE mg/dL
Leukocytes, UA: NEGATIVE
NITRITE: NEGATIVE
PH: 5 (ref 5.0–8.0)
Protein, ur: 100 mg/dL — AB
SPECIFIC GRAVITY, URINE: 1.012 (ref 1.005–1.030)
Squamous Epithelial / LPF: NONE SEEN

## 2017-03-15 LAB — CBC WITH DIFFERENTIAL/PLATELET
BASOS ABS: 0 10*3/uL (ref 0.0–0.1)
BASOS PCT: 0 %
EOS ABS: 0 10*3/uL (ref 0.0–0.7)
Eosinophils Relative: 1 %
HCT: 39.4 % (ref 39.0–52.0)
Hemoglobin: 13.3 g/dL (ref 13.0–17.0)
Lymphocytes Relative: 49 %
Lymphs Abs: 1.4 10*3/uL (ref 0.7–4.0)
MCH: 30.2 pg (ref 26.0–34.0)
MCHC: 33.8 g/dL (ref 30.0–36.0)
MCV: 89.5 fL (ref 78.0–100.0)
MONO ABS: 0.2 10*3/uL (ref 0.1–1.0)
Monocytes Relative: 7 %
NEUTROS ABS: 1.2 10*3/uL — AB (ref 1.7–7.7)
Neutrophils Relative %: 43 %
PLATELETS: 169 10*3/uL (ref 150–400)
RBC: 4.4 MIL/uL (ref 4.22–5.81)
RDW: 13.6 % (ref 11.5–15.5)
WBC: 2.9 10*3/uL — ABNORMAL LOW (ref 4.0–10.5)

## 2017-03-15 LAB — CBC
HEMATOCRIT: 42.2 % (ref 39.0–52.0)
Hemoglobin: 14.2 g/dL (ref 13.0–17.0)
MCH: 30 pg (ref 26.0–34.0)
MCHC: 33.6 g/dL (ref 30.0–36.0)
MCV: 89.2 fL (ref 78.0–100.0)
Platelets: 152 10*3/uL (ref 150–400)
RBC: 4.73 MIL/uL (ref 4.22–5.81)
RDW: 13.2 % (ref 11.5–15.5)
WBC: 2.4 10*3/uL — ABNORMAL LOW (ref 4.0–10.5)

## 2017-03-15 LAB — I-STAT TROPONIN, ED: Troponin i, poc: 0 ng/mL (ref 0.00–0.08)

## 2017-03-15 LAB — CBG MONITORING, ED: Glucose-Capillary: 106 mg/dL — ABNORMAL HIGH (ref 65–99)

## 2017-03-15 MED ORDER — DICYCLOMINE HCL 20 MG PO TABS
20.0000 mg | ORAL_TABLET | Freq: Every day | ORAL | Status: DC
Start: 2017-03-15 — End: 2017-03-16
  Administered 2017-03-16: 20 mg via ORAL
  Filled 2017-03-15: qty 1

## 2017-03-15 MED ORDER — SODIUM CHLORIDE 0.9 % IV BOLUS (SEPSIS)
1000.0000 mL | Freq: Once | INTRAVENOUS | Status: AC
  Administered 2017-03-15: 1000 mL via INTRAVENOUS

## 2017-03-15 MED ORDER — POTASSIUM CHLORIDE CRYS ER 20 MEQ PO TBCR
40.0000 meq | EXTENDED_RELEASE_TABLET | Freq: Once | ORAL | Status: AC
  Administered 2017-03-15: 40 meq via ORAL
  Filled 2017-03-15: qty 2

## 2017-03-15 MED ORDER — ALUM & MAG HYDROXIDE-SIMETH 200-200-20 MG/5ML PO SUSP
15.0000 mL | Freq: Once | ORAL | Status: AC
  Administered 2017-03-15: 15 mL via ORAL
  Filled 2017-03-15: qty 30

## 2017-03-15 MED ORDER — ONDANSETRON HCL 4 MG/2ML IJ SOLN: 4.0000 mg | Freq: Once | INTRAMUSCULAR | Status: DC

## 2017-03-15 MED ORDER — ONDANSETRON HCL 4 MG/2ML IJ SOLN
INTRAMUSCULAR | Status: AC
  Administered 2017-03-15: 4 mg
  Filled 2017-03-15: qty 2

## 2017-03-15 MED ORDER — ALBUTEROL SULFATE (2.5 MG/3ML) 0.083% IN NEBU: 3.0000 mL | INHALATION_SOLUTION | Freq: Four times a day (QID) | RESPIRATORY_TRACT | Status: DC | PRN

## 2017-03-15 MED ORDER — IBUPROFEN 200 MG PO TABS: 400.0000 mg | ORAL_TABLET | Freq: Four times a day (QID) | ORAL | Status: DC | PRN

## 2017-03-15 MED ORDER — ACETAMINOPHEN 500 MG PO TABS
1000.0000 mg | ORAL_TABLET | Freq: Once | ORAL | Status: AC
  Administered 2017-03-15: 1000 mg via ORAL
  Filled 2017-03-15: qty 2

## 2017-03-15 MED ORDER — METOPROLOL TARTRATE 25 MG PO TABS
25.0000 mg | ORAL_TABLET | Freq: Two times a day (BID) | ORAL | Status: DC
  Administered 2017-03-16 (×2): 25 mg via ORAL
  Filled 2017-03-15 (×2): qty 1

## 2017-03-15 MED ORDER — ENOXAPARIN SODIUM 40 MG/0.4ML ~~LOC~~ SOLN
40.0000 mg | SUBCUTANEOUS | Status: DC
  Administered 2017-03-16: 40 mg via SUBCUTANEOUS
  Filled 2017-03-15: qty 0.4

## 2017-03-15 MED ORDER — KCL IN DEXTROSE-NACL 20-5-0.45 MEQ/L-%-% IV SOLN
INTRAVENOUS | Status: DC
  Administered 2017-03-16 (×2): via INTRAVENOUS
  Filled 2017-03-15 (×3): qty 1000

## 2017-03-15 NOTE — ED Triage Notes (Signed)
To ED via [facility] for eval after having syncopal episode while at work. Alert and oriented. Pt found with bp 70/p on EMS arrival. 500cc NS bolus given enroute and pressure up to 105/p. Pt with hx of pacemaker. CBG 135. Pt texting on arrival.

## 2017-03-15 NOTE — ED Notes (Signed)
Patient is stable and ready to be transport to the floor at this time.  Report was called to [facility].  Belongings taken with the patient to the floor.

## 2017-03-15 NOTE — ED Notes (Signed)
Pt asked to obtain urine sample and given urinal

## 2017-03-15 NOTE — H&P (Signed)
History and Physical    Maxwell Li ZOX:[id number] DOB: 1955-03-19 DOA: 03/15/2017  PCP: Rubie Maid, MD  Patient coming from: home   Chief Complaint: lighteadedness  HPI: Maxwell Li is a 62 y.o. 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 Today at work while sitting at desk experienced lightheadedness. Felt very out of it, doesn't remember much. Co-worker came in and per report saw patient slumped over desk. Patient remembers saying he wanted them to just give him time. EMS called and patient transferred to hospital. Denies chest pain or palpitations. Denies weakness or numbness or difficulty speaking or swallowing. No recent illness. No fever. No dysuria. No cough or shortness of breath. No recent immobilization, no leg swelling.   Pt reports history of chron's disease, has not been back to see gi for quite some time as they advised an expensive medication. Has daily diarrhea. And occasional emesis, one episode here in the ED. Does not think this is changed from baseline. Good po intake, says urine output is good. Denies abdominal pain or fever.  Family also shares patient is under considerable stress. His brother committed suicide in front of the patient less than a year ago and currently there is family stress related to his brother's estate.    ED Course: 2 L NS, labs, Kcl  Review of Systems: As per HPI otherwise 10 point review of systems negative.    Past Medical History:  Diagnosis Date  . DYSLIPIDEMIA 06/28/2009   Qualifier: Diagnosis of  By: Loanne Drilling MD, Jacelyn Pi   . GERD 09/04/2006   Qualifier: Diagnosis of  By: Loanne Drilling MD, Jacelyn Pi   . Hypertension   . HYPOGONADISM, MALE 05/20/2008   Qualifier: Diagnosis of  By: Loanne Drilling MD, Jacelyn Pi KNEE PAIN  03/24/2007   Qualifier: Diagnosis of  By: Loanne Drilling MD, Jacelyn Pi Ventral hernia     Past Surgical History:  Procedure Laterality Date  . ELECTROCARDIOGRAM  02/01/2006  . EP IMPLANTABLE DEVICE N/A 06/09/2015   Procedure: Pacemaker Implant;  Surgeon: Will Meredith Leeds, MD;  Location: [facility];  Service: Cardiovascular;  Laterality: N/A;  . 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 Rest Cardiolite  11/21/2000     reports that he has quit smoking. he has never used smokeless tobacco. He reports that he does not drink alcohol or use drugs.  Allergies  Allergen Reactions  . Lidocaine Swelling    Throat swelling   . Lovastatin     REACTION: Nausea  . Shellfish Allergy Itching    Family History  Problem Relation Age of Onset  . Cancer Brother        Lung Cancer    Prior to Admission medications   Medication Sig Start Date End Date Taking? Authorizing Provider  albuterol (PROVENTIL HFA;VENTOLIN HFA) 108 (90 Base) MCG/ACT inhaler Inhale 2 puffs into the lungs every 6 (six) hours as needed for wheezing or shortness of breath.   Yes [provider]  DICYCLOMINE HCL PO Take 1 tablet by mouth at bedtime.    Yes [provider]  hydrocortisone cream 1 % Apply 1 application topically daily as needed (facial rash).   Yes [provider]  ibuprofen (ADVIL,MOTRIN) 200 MG tablet Take 400 mg by mouth  every 6 (six) hours as needed (knee pain). Reported on 06/22/2015   Yes [provider]  metoprolol tartrate (LOPRESSOR) 25 MG tablet Take 1 tablet (25 mg total) by mouth 2 (two) times daily. Patient taking differently: Take 50 mg by mouth 2 (two) times daily.  02/08/17 05/09/17 Yes Baldwin Jamaica, PA-C  PRESCRIPTION MEDICATION Inhale into the lungs at bedtime. CPAP   Yes [provider]  testosterone cypionate (DEPOTESTOTERONE CYPIONATE) 200 MG/ML injection Inject 1 mL (200 mg total) into the muscle every 14 (fourteen) days. 04/28/13  Yes Renato Shin,  MD    Physical Exam: Vitals:   03/15/17 1904 03/15/17 1930 03/15/17 2015 03/15/17 2030  BP:  108/78 136/90 129/77  Pulse:  86 92 90  Resp:  11 20 19   Temp: 97.9 F (36.6 C)     TempSrc: Oral     SpO2:  98% 97% 98%    Constitutional: No acute distress Head: Atraumatic Eyes: Conjunctiva clear ENM: Moist mucous membranes. Normal dentition.  Neck: Supple Respiratory: Clear to auscultation bilaterally, no wheezing/rales/rhonchi. Normal respiratory effort. No accessory muscle use. Poor inspiratory effort Cardiovascular: Regular rate and rhythm. No murmurs/rubs/gallops. Distant heart sounds Abdomen: Non-tender, mildly-distended. No masses. No rebound or guarding. Positive bowel sounds. Musculoskeletal: No joint deformity upper and lower extremities. Normal ROM, no contractures. Normal muscle tone.  Skin: No rashes, lesions, or ulcers.  Extremities: No peripheral edema. Palpable peripheral pulses. Neurologic: Alert, moving all 4 extremities. Cn 2-12 grossly intact. Upper and lower strength 5/5. Distal sensation intact to light touch bilaterally. Psychiatric: Normal insight and judgement.   Labs on Admission: I have personally reviewed following labs and imaging studies  CBC: Recent Labs  Lab 03/15/17 1515  WBC 2.4*  HGB 14.2  HCT 42.2  MCV 89.2  PLT 284   Basic Metabolic Panel: Recent Labs  Lab 03/15/17 1515  NA 136  K 3.0*  CL 99*  CO2 22  GLUCOSE 117*  BUN 5*  CREATININE 1.05  CALCIUM 8.0*   GFR: CrCl cannot be calculated (Unknown ideal weight.). Liver Function Tests: No results for input(s): AST, ALT, ALKPHOS, BILITOT, PROT, ALBUMIN in the last 168 hours. No results for input(s): LIPASE, AMYLASE in the last 168 hours. No results for input(s): AMMONIA in the last 168 hours. Coagulation Profile: No results for input(s): INR, PROTIME in the last 168 hours. Cardiac Enzymes: No results for input(s): CKTOTAL, CKMB, CKMBINDEX, TROPONINI in the last 168 hours. BNP  (last 3 results) No results for input(s): PROBNP in the last 8760 hours. HbA1C: No results for input(s): HGBA1C in the last 72 hours. CBG: Recent Labs  Lab 03/15/17 1525  GLUCAP 106*   Lipid Profile: No results for input(s): CHOL, HDL, LDLCALC, TRIG, CHOLHDL, LDLDIRECT in the last 72 hours. Thyroid Function Tests: No results for input(s): TSH, T4TOTAL, FREET4, T3FREE, THYROIDAB in the last 72 hours. Anemia Panel: No results for input(s): VITAMINB12, FOLATE, FERRITIN, TIBC, IRON, RETICCTPCT in the last 72 hours. Urine analysis:    Component Value Date/Time   COLORURINE YELLOW 03/15/2017 2042   APPEARANCEUR HAZY (A) 03/15/2017 2042   LABSPEC 1.012 03/15/2017 2042   PHURINE 5.0 03/15/2017 2042   GLUCOSEU NEGATIVE 03/15/2017 2042   GLUCOSEU NEGATIVE 06/05/2011 0919   HGBUR SMALL (A) 03/15/2017 2042   BILIRUBINUR NEGATIVE 03/15/2017 2042   KETONESUR NEGATIVE 03/15/2017 2042   PROTEINUR 100 (A) 03/15/2017 2042   UROBILINOGEN 0.2 06/05/2011 0919   NITRITE NEGATIVE 03/15/2017 2042   LEUKOCYTESUR NEGATIVE 03/15/2017 2042  Radiological Exams on Admission: No results found.  EKG: Independently reviewed. No ischemic or rhythm or interval abnormalities.  Assessment/Plan Active Problems:   AV block, complete (HCC)   Hypogonadism in male   Postural dizziness with presyncope   Crohn disease (HCC)   OSA on CPAP   Essential hypertension   Leukopenia   Hypocalcemia   Hypokalemia   # Presyncope - favor orthostasis from dehydration 2/2 chronic uncontrolled chron's disease. Significant personal stressors likely also contribute. Denies chest pain/palpitations; initial ecg not concerning, initial tropnin negative, and ED says they interrogated pacemaker and no abnormalities found. No seizure-like activity. Normal neuro exam and no symptoms suggestive of stroke. No increased risk factors for pe and no respiratory complaints. Has hx of vertigo but does not describe true vertigo symptoms.  Glucose normal. bp on the low side when presented, now wnl to mildly elevated with fluid resuscitation. K and calcium are mildly low. - f/u orthostatic vital signs (though will take place after fluid resuscitation) - outpt GI f/u for uncontrolled crohn's disease - telemetry - trend troponins  # hypokalemia - likely GI losses 2/2 uncontrolled crohn's disease. S/p 40 meq in ed - d5 1/2 ns w/ 20 meq k - cmp in AM - f/u mg level, replete if low - f/u urine K/Cr ratio to exlude urinary losses given concomitant htn  # complete heart block - pacer appears to be functioning appropriately - continue home metop  # crohn's disease - daily diarrhea, occasional emesis. No fever or abd pain or sig change in symptoms to suggest acute flare - continue home dicyclomine - outpt gi f/u  # Hypocalcemia - possible ca 8.  - f/u ionized calcium, w/u if indicated  # Leukopenia - mild, WBC 2.4 - repeat cbc w/ diff  # hypogonadism - resume outpt 2x montly injections upon discharge  # OSA - qhs cpap ordered  # htn - here initial bp low normal but mild elevation w/ fluid resuscitation - resume home metoprolol  DVT prophylaxis: lovenox Code Status: full  Family Communication: daugter sarah parr [phone]  Disposition Plan: tbd  Consults called: none  Admission status: obs tele    Desma Maxim MD [facility] Pager [phone]  If 7PM-7AM, please contact night-coverage [email or web address] Password [facility]  03/15/2017, 9:03 PM

## 2017-03-15 NOTE — ED Provider Notes (Signed)
[facility] Provider Note   CSN: [id number] Arrival date & time: 03/15/17  1456     History   Chief Complaint Chief Complaint  Patient presents with  . Loss of Consciousness    HPI Maxwell Li is a 62 y.o. male.  HPI   62 year old male with a history of AV block, sick sinus syndrome with pacemaker in place, Crohn's disease, hx of prior mastoid tumor removal, presents with concern for pre-syncope bs syncope.  Reports he was slow to respond earlier with coworker, reports he was lightheaded, nearly having syncope.   On further history, reports there is a period of time he does not remember and per EMS bystander had reported a syncopal episode. He has had lightheadedness when he goes from sitting to standing.  His physician told him to increase his metoprolol but he has not increased it yet-bp normally in 140s.  Hasn't eaten or had much to drink today-small amount of coffee and chocolate pretzels in AM.  Then reports he may have had hamburger but he is not sure.   Denies chest pain, shortness of breath, numbness, weakness, black or bloody stools, nausea, vomiting.  Reports he has chronic diarrhea secondary to his Crohn's disease which is unchanged.  While in the emergency department, patient reported development of burning chest pain. Nausea and vomiting also developed in ED. Also reports slight headache on reeval.  Past Medical History:  Diagnosis Date  . DYSLIPIDEMIA 06/28/2009   Qualifier: Diagnosis of  By: Loanne Drilling MD, Jacelyn Pi   . GERD 09/04/2006   Qualifier: Diagnosis of  By: Loanne Drilling MD, Jacelyn Pi   . Hypertension   . HYPOGONADISM, MALE 05/20/2008   Qualifier: Diagnosis of  By: Loanne Drilling MD, Jacelyn Pi KNEE PAIN 03/24/2007   Qualifier: Diagnosis of  By: Loanne Drilling MD, Jacelyn Pi Ventral hernia     Patient Active Problem List   Diagnosis Date Noted  . Hypogonadism in male 03/15/2017  . Postural dizziness with presyncope 03/15/2017  . Crohn disease ([place])  03/15/2017  . OSA on CPAP 03/15/2017  . Essential hypertension 03/15/2017  . Leukopenia 03/15/2017  . Hypocalcemia 03/15/2017  . Hypokalemia 03/15/2017  . AV block, complete (HCC)   . Sick sinus syndrome ([place]) 06/08/2015  . Other abnormal blood chemistry 06/24/2012  . Encounter for long-term (current) use of other medications 06/24/2012  . Screening for prostate cancer 06/24/2012  . DYSLIPIDEMIA 06/28/2009  . HYPOGONADISM, MALE 05/20/2008  . KNEE PAIN 03/24/2007  . ABDOMINAL PAIN, UNSPECIFIED SITE 03/24/2007  . GERD 09/04/2006    Past Surgical History:  Procedure Laterality Date  . ELECTROCARDIOGRAM  02/01/2006  . EP IMPLANTABLE DEVICE N/A 06/09/2015   Procedure: Pacemaker Implant;  Surgeon: Will Meredith Leeds, MD;  Location: [facility];  Service: Cardiovascular;  Laterality: N/A;  . 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 Rest Cardiolite  11/21/2000       Home Medications    Prior to Admission medications   Medication Sig Start Date End Date Taking? Authorizing Provider  albuterol (PROVENTIL HFA;VENTOLIN HFA) 108 (90 Base) MCG/ACT inhaler Inhale 2 puffs into the lungs every 6 (six) hours as needed for wheezing or shortness of breath.   Yes [provider]  DICYCLOMINE HCL PO Take 1 tablet by mouth at bedtime.    Yes [provider]  hydrocortisone cream 1 % Apply 1 application topically daily as needed (facial rash).   Yes  [provider]  ibuprofen (ADVIL,MOTRIN) 200 MG tablet Take 400 mg by mouth every 6 (six) hours as needed (knee pain). Reported on 06/22/2015   Yes [provider]  metoprolol tartrate (LOPRESSOR) 25 MG tablet Take 1 tablet (25 mg total) by mouth 2 (two) times daily. Patient taking differently: Take 50 mg by mouth 2 (two) times daily.  02/08/17 05/09/17 Yes Baldwin Jamaica, PA-C  PRESCRIPTION MEDICATION Inhale into the lungs at bedtime. CPAP   Yes [provider]  testosterone cypionate (DEPOTESTOTERONE  CYPIONATE) 200 MG/ML injection Inject 1 mL (200 mg total) into the muscle every 14 (fourteen) days. 04/28/13  Yes Renato Shin, MD    Family History Family History  Problem Relation Age of Onset  . Cancer Brother        Lung Cancer    Social History Social History   Tobacco Use  . Smoking status: Former Research scientist (life sciences)  . Smokeless tobacco: Never Used  Substance Use Topics  . Alcohol use: No  . Drug use: No     Allergies   Lidocaine; Lovastatin; and Shellfish allergy   Review of Systems Review of Systems  Constitutional: Negative for fever.  HENT: Negative for sore throat.   Eyes: Negative for visual disturbance.  Respiratory: Negative for shortness of breath.   Cardiovascular: Negative for chest pain.  Gastrointestinal: Positive for diarrhea (chronic), nausea (developed in ED) and vomiting (developed in ED). Negative for abdominal pain.  Genitourinary: Negative for difficulty urinating and dysuria.  Musculoskeletal: Negative for back pain and neck stiffness.  Skin: Negative for rash.  Neurological: Positive for light-headedness. Negative for syncope, facial asymmetry, speech difficulty, weakness and headaches.     Physical Exam Updated Vital Signs BP 139/89 (BP Location: Right Arm)   Pulse 82   Temp 98.1 F (36.7 C) (Oral)   Resp 18   Ht 5' 3"  (1.6 m)   Wt 86.7 kg (191 lb 3.2 oz) Comment: scale c  SpO2 96%   BMI 33.87 kg/m   Physical Exam  Constitutional: He is oriented to person, place, and time. He appears well-developed and well-nourished. No distress.  HENT:  Head: Normocephalic and atraumatic.  Eyes: Conjunctivae and EOM are normal.  Neck: Normal range of motion.  Cardiovascular: Normal rate, regular rhythm, normal heart sounds and intact distal pulses. Exam reveals no gallop and no friction rub.  No murmur heard. Pulmonary/Chest: Effort normal and breath sounds normal. No respiratory distress. He has no wheezes. He has no rales.  Abdominal: Soft. He  exhibits no distension. There is no tenderness. There is no guarding.  Musculoskeletal: He exhibits no edema.  Neurological: He is alert and oriented to person, place, and time.  Skin: Skin is warm and dry. He is not diaphoretic.  Nursing note and vitals reviewed.    ED Treatments / Results  Labs (all labs ordered are listed, but only abnormal results are displayed) Labs Reviewed  BASIC METABOLIC PANEL - Abnormal; Notable for the following components:      Result Value   Potassium 3.0 (*)    Chloride 99 (*)    Glucose, Bld 117 (*)    BUN 5 (*)    Calcium 8.0 (*)    All other components within normal limits  CBC - Abnormal; Notable for the following components:   WBC 2.4 (*)    All other components within normal limits  URINALYSIS, ROUTINE W REFLEX MICROSCOPIC - Abnormal; Notable for the following components:   APPearance HAZY (*)  Hgb urine dipstick SMALL (*)    Protein, ur 100 (*)    Bacteria, UA RARE (*)    All other components within normal limits  MAGNESIUM - Abnormal; Notable for the following components:   Magnesium 1.5 (*)    All other components within normal limits  CBC WITH DIFFERENTIAL/PLATELET - Abnormal; Notable for the following components:   WBC 2.9 (*)    Neutro Abs 1.2 (*)    All other components within normal limits  CBG MONITORING, ED - Abnormal; Notable for the following components:   Glucose-Capillary 106 (*)    All other components within normal limits  URINE CULTURE  CREATININE, SERUM  NA AND K (SODIUM & POTASSIUM), RAND UR  CREATININE, URINE, RANDOM  HIV ANTIBODY (ROUTINE TESTING)  CBC  COMPREHENSIVE METABOLIC PANEL  CALCIUM, IONIZED  I-STAT TROPONIN, ED    EKG  EKG Interpretation  Date/Time:  Friday March 15 2017 16:07:09 EST Ventricular Rate:  88 PR Interval:    QRS Duration: 88 QT Interval:  380 QTC Calculation: 460 R Axis:   89 Text Interpretation:  Sinus rhythm Prolonged PR interval Borderline right axis deviation Low voltage,  precordial leads No significant change since last tracing Confirmed by Gareth Morgan [phone]) on 03/15/2017 8:22:03 PM       Radiology No results found.  Procedures Procedures (including critical care time)  Medications Ordered in ED Medications  ibuprofen (ADVIL,MOTRIN) tablet 400 mg (not administered)  dicyclomine (BENTYL) tablet 20 mg (20 mg Oral Given 03/16/17 0000)  metoprolol tartrate (LOPRESSOR) tablet 25 mg (25 mg Oral Given 03/16/17 0000)  albuterol (PROVENTIL) (2.5 MG/3ML) 0.083% nebulizer solution 3 mL (not administered)  enoxaparin (LOVENOX) injection 40 mg (not administered)  dextrose 5 % and 0.45 % NaCl with KCl 20 mEq/L infusion ( Intravenous New Bag/Given 03/16/17 0000)  potassium chloride SA (K-DUR,KLOR-CON) CR tablet 40 mEq (40 mEq Oral Given 03/15/17 1706)  sodium chloride 0.9 % bolus 1,000 mL (0 mLs Intravenous Stopped 03/15/17 2025)  ondansetron (ZOFRAN) 4 MG/2ML injection (4 mg  Given 03/15/17 1646)  acetaminophen (TYLENOL) tablet 1,000 mg (1,000 mg Oral Given 03/15/17 2022)  alum & mag hydroxide-simeth (MAALOX/MYLANTA) 200-200-20 MG/5ML suspension 15 mL (15 mLs Oral Given 03/15/17 2022)     Initial Impression / Assessment and Plan / ED Course  I have reviewed the triage vital signs and the nursing notes.  Pertinent labs & imaging results that were available during my care of the patient were reviewed by me and considered in my medical decision making (see chart for details).     62 year old male with a history of AV block, sick sinus syndrome with pacemaker in place, Crohn's disease, hx of prior mastoid tumor removal, presents with concern for syncope.  Initially denies CP, nausea, vomiting. BP with EMS 70s, improved with fluid and in 81W systolic early in ED stay.  Patient initially reporting not eating or drinking much today, and suspect that episode may have been secondary to dehydration in setting of chronic diarrhea from Crohn's, and patient not eating or drinking much  today.  His pacemaker was interrogated which showed no sign of events.  Labs show mild hypokalemia.  Giving IV fluids with plan to reassess, however while in the emergency department, patient developed burning chest pain, nausea and vomiting.  EKG and troponin obtained at this time showed no acute abnormalities.  Given his medical history, history of chest pain, nausea vomiting, as well as syncopal event with hypotension earlier today, will admit for syncope  and chest pain observation.  Other etiologies of his symptoms include possible viral infection with GERD, however will admit for continued observation and hydration.  Final Clinical Impressions(s) / ED Diagnoses   Final diagnoses:  Syncope, unspecified syncope type  Chest pain, unspecified type    ED Discharge Orders    None       Gareth Morgan, MD 03/16/17 [id number]

## 2017-03-16 ENCOUNTER — Other Ambulatory Visit: Payer: Self-pay

## 2017-03-16 ENCOUNTER — Observation Stay (HOSPITAL_COMMUNITY): Payer: BLUE CROSS/BLUE SHIELD

## 2017-03-16 ENCOUNTER — Encounter (HOSPITAL_COMMUNITY): Payer: Self-pay | Admitting: *Deleted

## 2017-03-16 DIAGNOSIS — R42 Dizziness and giddiness: Secondary | ICD-10-CM | POA: Diagnosis not present

## 2017-03-16 DIAGNOSIS — R062 Wheezing: Secondary | ICD-10-CM | POA: Diagnosis not present

## 2017-03-16 DIAGNOSIS — K5090 Crohn's disease, unspecified, without complications: Secondary | ICD-10-CM | POA: Diagnosis not present

## 2017-03-16 DIAGNOSIS — I442 Atrioventricular block, complete: Secondary | ICD-10-CM | POA: Diagnosis not present

## 2017-03-16 DIAGNOSIS — R079 Chest pain, unspecified: Secondary | ICD-10-CM

## 2017-03-16 DIAGNOSIS — R55 Syncope and collapse: Secondary | ICD-10-CM | POA: Diagnosis not present

## 2017-03-16 DIAGNOSIS — E291 Testicular hypofunction: Secondary | ICD-10-CM | POA: Diagnosis not present

## 2017-03-16 DIAGNOSIS — E876 Hypokalemia: Secondary | ICD-10-CM | POA: Diagnosis not present

## 2017-03-16 LAB — COMPREHENSIVE METABOLIC PANEL
ALT: 14 U/L — AB (ref 17–63)
AST: 20 U/L (ref 15–41)
Albumin: 3 g/dL — ABNORMAL LOW (ref 3.5–5.0)
Alkaline Phosphatase: 64 U/L (ref 38–126)
Anion gap: 9 (ref 5–15)
BUN: 7 mg/dL (ref 6–20)
CHLORIDE: 100 mmol/L — AB (ref 101–111)
CO2: 26 mmol/L (ref 22–32)
CREATININE: 0.81 mg/dL (ref 0.61–1.24)
Calcium: 8.7 mg/dL — ABNORMAL LOW (ref 8.9–10.3)
GFR calc Af Amer: >60 mL/min (ref >60)
GFR calc non Af Amer: >60 mL/min (ref >60)
Glucose, Bld: 107 mg/dL — ABNORMAL HIGH (ref 65–99)
POTASSIUM: 4.4 mmol/L (ref 3.5–5.1)
SODIUM: 135 mmol/L (ref 135–145)
Total Bilirubin: 0.8 mg/dL (ref 0.3–1.2)
Total Protein: 6.1 g/dL — ABNORMAL LOW (ref 6.5–8.1)

## 2017-03-16 LAB — CREATININE, URINE, RANDOM: CREATININE, URINE: 205.68 mg/dL

## 2017-03-16 LAB — MAGNESIUM: MAGNESIUM: 1.6 mg/dL — AB (ref 1.7–2.4)

## 2017-03-16 LAB — CREATININE, SERUM
Creatinine, Ser: 0.86 mg/dL (ref 0.61–1.24)
GFR calc Af Amer: >60 mL/min (ref >60)
GFR calc non Af Amer: >60 mL/min (ref >60)

## 2017-03-16 LAB — HIV ANTIBODY (ROUTINE TESTING W REFLEX): HIV Screen 4th Generation wRfx: NONREACTIVE

## 2017-03-16 LAB — CALCIUM, IONIZED: CALCIUM, IONIZED, SERUM: 4.9 mg/dL (ref 4.5–5.6)

## 2017-03-16 LAB — NA AND K (SODIUM & POTASSIUM), RAND UR
Potassium Urine: 37 mmol/L
Sodium, Ur: 76 mmol/L

## 2017-03-16 MED ORDER — MAGNESIUM SULFATE 4 GM/100ML IV SOLN
4.0000 g | Freq: Once | INTRAVENOUS | Status: AC
  Administered 2017-03-16: 4 g via INTRAVENOUS
  Filled 2017-03-16: qty 100

## 2017-03-16 MED ORDER — MAGNESIUM SULFATE 2 GM/50ML IV SOLN: 2.0000 g | Freq: Once | INTRAVENOUS | Status: DC

## 2017-03-16 NOTE — Discharge Summary (Signed)
Physician Discharge Summary  Maxwell Li:[id number] DOB: 03-20-1955 DOA: 03/15/2017  PCP: Maxwell Maid, MD  Admit date: 03/15/2017 Discharge date: 03/16/2017  Time spent: 45 minutes  Recommendations for Outpatient Follow-up:  Patient will be discharged to home.  Patient will need to follow up with primary care provider within one week of discharge, repeat CBC, BMP, and magnesium.  Patient should continue medications as prescribed.  Patient should follow a heart healthy diet.   Discharge Diagnoses:  Near syncope/Presyncope/lightheadedness Hypokalemia Hypomagnesemia History of complete heart block Crohn's disease Hypocalcemia Leukopenia Hypogonadism Obstructive sleep apnea Essential hypertension  Discharge Condition: Stable  Diet recommendation: heart healthy  Filed Weights   03/15/17 2235 03/16/17 0510  Weight: 86.7 kg (191 lb 3.2 oz) 85.6 kg (188 lb 11.2 oz)    History of present illness:  On 03/15/2017 by Dr. Laurey Li Maxwell Li is a 62 y.o. 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 Today at work while sitting at desk experienced lightheadedness. Felt very out of it, doesn't remember much. Co-worker came in and per report saw patient slumped over desk. Patient remembers saying he wanted them to just give him time. EMS called and patient transferred to hospital. Denies chest pain or palpitations. Denies weakness or numbness or difficulty speaking or swallowing. No recent illness. No fever. No dysuria. No cough or shortness of breath. No recent immobilization, no leg swelling.   Pt reports history of chron's disease, has not been back to see gi for quite some time as they advised an expensive medication. Has daily diarrhea. And occasional  emesis, one episode here in the ED. Does not think this is changed from baseline. Good po intake, says urine output is good. Denies abdominal pain or fever.  Family also shares patient is under considerable stress. His brother committed suicide in front of the patient less than a year ago and currently there is family stress related to his brother's estate.    Hospital Course:  Near syncope/Presyncope/lightheadedness -Suspect secondary to dehydration secondary to uncontrolled Crohn's disease as well as stress. -Patient denied loss of consciousness, chest pain or palpitations prior to the event -No longer complaining of dizziness or weakness. -Troponin was unremarkable  -Patient's pacemaker was interrogated in the emergency department, no abnormalities were found -No seizure-like activity prior to episode of lightheadedness or dizziness.  No complaints or symptoms of vertigo although patient does have a history of vertigo. -Patient was also noted to have hypokalemia and hypomagnesemia -Orthostatic vitals unremarkable however patient received IVF resuscitation  -Patient did receive IV fluids -No source of infection found, chest x-ray as well as UA unremarkable  Hypokalemia -Suspect secondary to GI losses secondary to uncontrolled Crohn's disease -Potassium on admission 3, after repletion, currently 4.4 -Repeat BMP in 1 week  Hypomagnesemia -Magnesium 1.6, will replace with IV magnesium -Discussed magnesium-containing foods -Repeat magnesium in 1 week -Would avoid magnesium oxide as this could exacerbate patient's Crohn's symptoms  History of complete heart block -Status post pacemaker -Appears to be stable  Crohn's disease -Continue dicyclomine -Does not appear to be flared -Patient should continue outpatient gastroenterology follow-up and monitoring -Stress can certainly exacerbate symptoms  Hypocalcemia -Calcium currently 8.7, corrected for albumin of 3-  9.5  Leukopenia -Mildly improved, would repeat CBC in 1 week -May need additional outpatient monitoring  and follow-up  Hypogonadism -Continue outpatient injections  Obstructive sleep apnea -Continue CPAP nightly  Essential hypertension -Continue metoprolol  Stress -Patient lost his brother approximate 1 year ago to suicide and has been under significant amount of stress since.  Currently dealing with his brother's estate.  Suspect of the stress could be leading to patient's uncontrolled Crohn's as well as lightheadedness. -Patient should follow-up with his primary care physician  Tobacco abuse -Discussed smoking cessation  Procedures: none  Consultations: None  Discharge Exam: Vitals:   03/16/17 0830 03/16/17 1152  BP: 135/78 (!) 150/81  Pulse:  69  Resp: 18 18  Temp: 97.6 F (36.4 C) 98 F (36.7 C)  SpO2: 98% 98%   Patient seen and examined on day of discharge.  No longer complaining of dizziness or lightheadedness.  Feeling better.  Denies current chest pain, shortness of breath, abdominal pain, nausea or vomiting, headache, visual changes.   General: Well developed, well nourished, NAD, appears stated age  44: NCAT, mucous membranes moist.  Neck: Supple, no JVD  Cardiovascular: S1 S2 auscultated, RRR, no murmurs  Respiratory: Faint expiratory wheezing   Abdomen: Soft, nontender, nondistended, + bowel sounds  Extremities: warm dry without cyanosis clubbing or edema  Neuro: AAOx3, nonfocal  Skin: Without rashes exudates or nodules  Psych: Normal affect and demeanor with intact judgement and insight  Discharge Instructions Discharge Instructions    Discharge instructions   Complete by:  As directed    Patient will be discharged to home.  Patient will need to follow up with primary care provider within one week of discharge, repeat CBC, BMP, and magnesium.  Patient should continue medications as prescribed.  Patient should follow a heart healthy  diet.     Allergies as of 03/16/2017      Reactions   Lidocaine Swelling   Throat swelling    Lovastatin    REACTION: Nausea   Shellfish Allergy Itching      Medication List    TAKE these medications   albuterol 108 (90 Base) MCG/ACT inhaler Commonly known as:  PROVENTIL HFA;VENTOLIN HFA Inhale 2 puffs into the lungs every 6 (six) hours as needed for wheezing or shortness of breath.   DICYCLOMINE HCL PO Take 20 mg by mouth at bedtime as needed (stomach spasms).   hydrocortisone cream 1 % Apply 1 application topically daily as needed (facial rash).   ibuprofen 200 MG tablet Commonly known as:  ADVIL,MOTRIN Take 400 mg by mouth every 6 (six) hours as needed (knee pain). Reported on 06/22/2015   metoprolol tartrate 25 MG tablet Commonly known as:  LOPRESSOR Take 1 tablet (25 mg total) by mouth 2 (two) times daily.   PRESCRIPTION MEDICATION Inhale into the lungs at bedtime. CPAP   testosterone cypionate 200 MG/ML injection Commonly known as:  DEPOTESTOSTERONE CYPIONATE Inject 1 mL (200 mg total) into the muscle every 14 (fourteen) days.      Allergies  Allergen Reactions  . Lidocaine Swelling    Throat swelling   . Lovastatin     REACTION: Nausea  . Shellfish Allergy Itching   Follow-up Information    Maxwell Maid, MD. Schedule an appointment as soon as possible for a visit in 1 week(s).   Specialty:  Family Medicine Why:  Hospital follow up Contact information: [address] [place] [address] [phone]            The results of significant diagnostics from this hospitalization (including imaging, microbiology, ancillary and laboratory) are listed  below for reference.    Significant Diagnostic Studies: Dg Chest 2 View  Result Date: 03/16/2017 CLINICAL DATA:  Altered mental status. EXAM: CHEST - 2 VIEW COMPARISON:  06/10/2015; 06/08/2015; 04/04/2007 FINDINGS: Grossly unchanged cardiac silhouette and mediastinal contours. Stable position  of support apparatus. No focal airspace opacities. No pleural effusion or pneumothorax. No evidence of edema. No acute osseus abnormalities. IMPRESSION: No acute cardiopulmonary disease. Electronically Signed   By: Sandi Mariscal M.D.   On: 03/16/2017 09:52    Microbiology: No results found for this or any previous visit (from the past 240 hour(s)).   Labs: Basic Metabolic Panel: Recent Labs  Lab 03/15/17 1515 03/15/17 1520 03/15/17 2318 03/16/17 0545 03/16/17 0707  NA 136  --   --  135  --   K 3.0*  --   --  4.4  --   CL 99*  --   --  100*  --   CO2 22  --   --  26  --   GLUCOSE 117*  --   --  107*  --   BUN 5*  --   --  7  --   CREATININE 1.05  --  0.86 0.81  --   CALCIUM 8.0*  --   --  8.7*  --   MG  --  1.5*  --   --  1.6*   Liver Function Tests: Recent Labs  Lab 03/16/17 0545  AST 20  ALT 14*  ALKPHOS 64  BILITOT 0.8  PROT 6.1*  ALBUMIN 3.0*   No results for input(s): LIPASE, AMYLASE in the last 168 hours. No results for input(s): AMMONIA in the last 168 hours. CBC: Recent Labs  Lab 03/15/17 1515 03/15/17 2318  WBC 2.4* 2.9*  NEUTROABS  --  1.2*  HGB 14.2 13.3  HCT 42.2 39.4  MCV 89.2 89.5  PLT 152 169   Cardiac Enzymes: No results for input(s): CKTOTAL, CKMB, CKMBINDEX, TROPONINI in the last 168 hours. BNP: BNP (last 3 results) No results for input(s): BNP in the last 8760 hours.  ProBNP (last 3 results) No results for input(s): PROBNP in the last 8760 hours.  CBG: Recent Labs  Lab 03/15/17 1525  GLUCAP 106*       Signed:  Aleeah Greeno  [facility] 03/16/2017, 1:18 PM

## 2017-03-16 NOTE — Plan of Care (Signed)
  Safety: Ability to remain free from injury will improve 03/16/2017 0422 - Completed/Met by Evert Kohl, RN   Pain Managment: General experience of comfort will improve 03/16/2017 0422 - Completed/Met by Evert Kohl, RN   Elimination: Will not experience complications related to urinary retention 03/16/2017 0422 - Completed/Met by Evert Kohl, RN   Elimination: Will not experience complications related to bowel motility 03/16/2017 0422 - Completed/Met by Evert Kohl, RN   Coping: Level of anxiety will decrease 03/16/2017 0422 - Completed/Met by Evert Kohl, RN   Nutrition: Adequate nutrition will be maintained 03/16/2017 0422 - Completed/Met by Evert Kohl, RN   Skin Integrity: Risk for impaired skin integrity will decrease 03/16/2017 0422 - Completed/Met by Evert Kohl, RN

## 2017-03-16 NOTE — Progress Notes (Signed)
Pt got discharged to home, discharge instructions provided and patient showed understanding to it, IV taken out,Telemonitor DC,pt left unit in wheelchair with all of the belongings accompanied with a family member.  Palma Holter, RN

## 2017-03-16 NOTE — Discharge Instructions (Signed)
Dizziness Dizziness is a common problem. It makes you feel unsteady or light-headed. You may feel like you are about to pass out (faint). Dizziness can lead to getting hurt if you stumble or fall. Dizziness can be caused by many things, including:  Medicines.  Not having enough water in your body (dehydration).  Illness.  Follow these instructions at home: Eating and drinking  Drink enough fluid to keep your pee (urine) clear or pale yellow. This helps to keep you from getting dehydrated. Try to drink more clear fluids, such as water.  Do not drink alcohol.  Limit how much caffeine you drink or eat, if your doctor tells you to do that.  Limit how much salt (sodium) you drink or eat, if your doctor tells you to do that. Activity  Avoid making quick movements. ? When you stand up from sitting in a chair, steady yourself until you feel okay. ? In the morning, first sit up on the side of the bed. When you feel okay, stand slowly while you hold onto something. Do this until you know that your balance is fine.  If you need to stand in one place for a long time, move your legs often. Tighten and relax the muscles in your legs while you are standing.  Do not drive or use heavy machinery if you feel dizzy.  Avoid bending down if you feel dizzy. Place items in your home so you can reach them easily without leaning over. Lifestyle  Do not use any products that contain nicotine or tobacco, such as cigarettes and e-cigarettes. If you need help quitting, ask your doctor.  Try to lower your stress level. You can do this by using methods such as yoga or meditation. Talk with your doctor if you need help. General instructions  Watch your dizziness for any changes.  Take over-the-counter and prescription medicines only as told by your doctor. Talk with your doctor if you think that you are dizzy because of a medicine that you are taking.  Tell a friend or a family member that you are feeling  dizzy. If he or she notices any changes in your behavior, have this person call your doctor.  Keep all follow-up visits as told by your doctor. This is important. Contact a doctor if:  Your dizziness does not go away.  Your dizziness or light-headedness gets worse.  You feel sick to your stomach (nauseous).  You have trouble hearing.  You have new symptoms.  You are unsteady on your feet.  You feel like the room is spinning. Get help right away if:  You throw up (vomit) or have watery poop (diarrhea), and you cannot eat or drink anything.  You have trouble: ? Talking. ? Walking. ? Swallowing. ? Using your arms, hands, or legs.  You feel generally weak.  You are not thinking clearly, or you have trouble forming sentences. A friend or family member may notice this.  You have: ? Chest pain. ? Pain in your belly (abdomen). ? Shortness of breath. ? Sweating.  Your vision changes.  You are bleeding.  You have a very bad headache.  You have neck pain or a stiff neck.  You have a fever. These symptoms may be an emergency. Do not wait to see if the symptoms will go away. Get medical help right away. Call your local emergency services (911 in the [place] Do not drive yourself to the hospital. Summary  Dizziness makes you feel unsteady or light-headed. You may  feel like you are about to pass out (faint).  Drink enough fluid to keep your pee (urine) clear or pale yellow. Do not drink alcohol.  Avoid making quick movements if you feel dizzy.  Watch your dizziness for any changes. This information is not intended to replace advice given to you by your health care provider. Make sure you discuss any questions you have with your health care provider. Document Released: 12/14/2010 Document Revised: 01/12/2016 Document Reviewed: 01/12/2016 Elsevier Interactive Patient Education  2017 [employer] and Stress Management Stress is a normal reaction to life events.  It is what you feel when life demands more than you are used to or more than you can handle. Some stress can be useful. For example, the stress reaction can help you catch the last bus of the day, study for a test, or meet a deadline at work. But stress that occurs too often or for too long can cause problems. It can affect your emotional health and interfere with relationships and normal daily activities. Too much stress can weaken your immune system and increase your risk for physical illness. If you already have a medical problem, stress can make it worse. What are the causes? All sorts of life events may cause stress. An event that causes stress for one person may not be stressful for another person. Major life events commonly cause stress. These may be positive or negative. Examples include losing your job, moving into a new home, getting married, having a baby, or losing a loved one. Less obvious life events may also cause stress, especially if they occur day after day or in combination. Examples include working long hours, driving in traffic, caring for children, being in debt, or being in a difficult relationship. What are the signs or symptoms? Stress may cause emotional symptoms including, the following:  Anxiety. This is feeling worried, afraid, on edge, overwhelmed, or out of control.  Anger. This is feeling irritated or impatient.  Depression. This is feeling sad, down, helpless, or guilty.  Difficulty focusing, remembering, or making decisions.  Stress may cause physical symptoms, including the following:  Aches and pains. These may affect your head, neck, back, stomach, or other areas of your body.  Tight muscles or clenched jaw.  Low energy or trouble sleeping.  Stress may cause unhealthy behaviors, including the following:  Eating to feel better (overeating) or skipping meals.  Sleeping too little, too much, or both.  Working too much or putting off tasks  (procrastination).  Smoking, drinking alcohol, or using drugs to feel better.  How is this diagnosed? Stress is diagnosed through an assessment by your health care provider. Your health care provider will ask questions about your symptoms and any stressful life events.Your health care provider will also ask about your medical history and may order blood tests or other tests. Certain medical conditions and medicine can cause physical symptoms similar to stress. Mental illness can cause emotional symptoms and unhealthy behaviors similar to stress. Your health care provider may refer you to a mental health professional for further evaluation. How is this treated? Stress management is the recommended treatment for stress.The goals of stress management are reducing stressful life events and coping with stress in healthy ways. Techniques for reducing stressful life events include the following:  Stress identification. Self-monitor for stress and identify what causes stress for you. These skills may help you to avoid some stressful events.  Time management. Set your priorities, keep a calendar of  events, and learn to say no. These tools can help you avoid making too many commitments.  Techniques for coping with stress include the following:  Rethinking the problem. Try to think realistically about stressful events rather than ignoring them or overreacting. Try to find the positives in a stressful situation rather than focusing on the negatives.  Exercise. Physical exercise can release both physical and emotional tension. The key is to find a form of exercise you enjoy and do it regularly.  Relaxation techniques. These relax the body and mind. Examples include yoga, meditation, tai chi, biofeedback, deep breathing, progressive muscle relaxation, listening to music, being out in nature, journaling, and other hobbies. Again, the key is to find one or more that you enjoy and can do regularly.  Healthy  lifestyle. Eat a balanced diet, get plenty of sleep, and do not smoke. Avoid using alcohol or drugs to relax.  Strong support network. Spend time with family, friends, or other people you enjoy being around.Express your feelings and talk things over with someone you trust.  Counseling or talktherapy with a mental health professional may be helpful if you are having difficulty managing stress on your own. Medicine is typically not recommended for the treatment of stress.Talk to your health care provider if you think you need medicine for symptoms of stress. Follow these instructions at home:  Keep all follow-up visits as directed by your health care provider.  Take all medicines as directed by your health care provider. Contact a health care provider if:  Your symptoms get worse or you start having new symptoms.  You feel overwhelmed by your problems and can no longer manage them on your own. Get help right away if:  You feel like hurting yourself or someone else. This information is not intended to replace advice given to you by your health care provider. Make sure you discuss any questions you have with your health care provider. Document Released: 06/20/2000 Document Revised: 06/02/2015 Document Reviewed: 08/19/2012 Elsevier Interactive Patient Education  2017 [employer]  Near-Syncope Near-syncope is when you suddenly get weak or dizzy, or you feel like you might pass out (faint). During an episode of near-syncope, you may:  Feel dizzy or light-headed.  Feel sick to your stomach (nauseous).  See all white or all black.  Have cold, clammy skin.  If you passed out, get help right away.Call your local emergency services (911 in the [place] Do not drive yourself to the hospital. Follow these instructions at home: Pay attention to any changes in your symptoms. Take these actions to help with your condition:  Have someone stay with you until you feel stable.  Do not drive, use  machinery, or play sports until your doctor says it is okay.  Keep all follow-up visits as told by your doctor. This is important.  If you start to feel like you might pass out, lie down right away and raise (elevate) your feet above the level of your heart. Breathe deeply and steadily. Wait until all of the symptoms are gone.  Drink enough fluid to keep your pee (urine) clear or pale yellow.  If you are taking blood pressure or heart medicine, get up slowly and spend many minutes getting ready to sit and then stand. This can help with dizziness.  Take over-the-counter and prescription medicines only as told by your doctor.  Get help right away if:  You have a very bad headache.  You have unusual pain in your chest, tummy, or back.  You are bleeding from your mouth or rectum.  You have black or tarry poop (stool).  You have a very fast or uneven heartbeat (palpitations).  You pass out one time or more than once.  You have jerky movements that you cannot control (seizure).  You are confused.  You have trouble walking.  You are very weak.  You have vision problems. These symptoms may be an emergency. Do not wait to see if the symptoms will go away. Get medical help right away. Call your local emergency services (911 in the [place] Do not drive yourself to the hospital. This information is not intended to replace advice given to you by your health care provider. Make sure you discuss any questions you have with your health care provider. Document Released: 06/13/2007 Document Revised: 06/02/2015 Document Reviewed: 09/08/2014 Elsevier Interactive Patient Education  2017 [employer].

## 2017-03-17 LAB — URINE CULTURE: CULTURE: NO GROWTH

## 2017-03-27 ENCOUNTER — Ambulatory Visit (INDEPENDENT_AMBULATORY_CARE_PROVIDER_SITE_OTHER): Payer: BLUE CROSS/BLUE SHIELD | Admitting: *Deleted

## 2017-03-27 DIAGNOSIS — I495 Sick sinus syndrome: Secondary | ICD-10-CM | POA: Diagnosis not present

## 2017-03-27 NOTE — Progress Notes (Signed)
Remote pacemaker transmission.   

## 2017-03-28 ENCOUNTER — Encounter: Payer: Self-pay | Admitting: Cardiology

## 2017-03-29 LAB — CUP PACEART REMOTE DEVICE CHECK
Battery Remaining Percentage: 95.5 %
Battery Voltage: 3.02 V
Brady Statistic AP VP Percent: 1 %
Brady Statistic AS VS Percent: 95 %
Implantable Lead Implant Date: 20170601
Implantable Pulse Generator Implant Date: 20170601
Lead Channel Impedance Value: 480 Ohm
Lead Channel Impedance Value: 600 Ohm
Lead Channel Pacing Threshold Amplitude: 0.625 V
Lead Channel Pacing Threshold Pulse Width: 0.5 ms
Lead Channel Sensing Intrinsic Amplitude: 4.4 mV
Lead Channel Setting Pacing Amplitude: 2.5 V
MDC IDC LEAD IMPLANT DT: 20170601
MDC IDC LEAD LOCATION: 753859
MDC IDC LEAD LOCATION: 753860
MDC IDC MSMT BATTERY REMAINING LONGEVITY: 128 mo
MDC IDC MSMT LEADCHNL RA PACING THRESHOLD PULSEWIDTH: 0.5 ms
MDC IDC MSMT LEADCHNL RV PACING THRESHOLD AMPLITUDE: 0.75 V
MDC IDC MSMT LEADCHNL RV SENSING INTR AMPL: 11.7 mV
MDC IDC PG SERIAL: 7904591
MDC IDC SESS DTM: 20190320060015
MDC IDC SET LEADCHNL RA PACING AMPLITUDE: 1.625
MDC IDC SET LEADCHNL RV PACING PULSEWIDTH: 0.5 ms
MDC IDC SET LEADCHNL RV SENSING SENSITIVITY: 2 mV
MDC IDC STAT BRADY AP VS PERCENT: 3.9 %
MDC IDC STAT BRADY AS VP PERCENT: 1 %
MDC IDC STAT BRADY RA PERCENT PACED: 3.8 %
MDC IDC STAT BRADY RV PERCENT PACED: 1 %

## 2017-06-26 ENCOUNTER — Ambulatory Visit (INDEPENDENT_AMBULATORY_CARE_PROVIDER_SITE_OTHER): Payer: BLUE CROSS/BLUE SHIELD | Admitting: *Deleted

## 2017-06-26 DIAGNOSIS — I442 Atrioventricular block, complete: Secondary | ICD-10-CM | POA: Diagnosis not present

## 2017-06-26 NOTE — Progress Notes (Signed)
Remote pacemaker transmission.   

## 2017-07-16 LAB — CUP PACEART REMOTE DEVICE CHECK
Battery Remaining Longevity: 127 mo
Battery Remaining Percentage: 95.5 %
Battery Voltage: 3.02 V
Brady Statistic AS VP Percent: 1 %
Brady Statistic AS VS Percent: 96 %
Brady Statistic RA Percent Paced: 2.8 %
Brady Statistic RV Percent Paced: 1 %
Date Time Interrogation Session: 20190619060014
Implantable Lead Implant Date: 20170601
Implantable Lead Location: 753859
Implantable Lead Location: 753860
Implantable Pulse Generator Implant Date: 20170601
Lead Channel Impedance Value: 480 Ohm
Lead Channel Pacing Threshold Amplitude: 0.75 V
Lead Channel Pacing Threshold Pulse Width: 0.5 ms
Lead Channel Sensing Intrinsic Amplitude: 5 mV
Lead Channel Setting Pacing Pulse Width: 0.5 ms
Lead Channel Setting Sensing Sensitivity: 2 mV
MDC IDC LEAD IMPLANT DT: 20170601
MDC IDC MSMT LEADCHNL RA PACING THRESHOLD AMPLITUDE: 0.625 V
MDC IDC MSMT LEADCHNL RV IMPEDANCE VALUE: 530 Ohm
MDC IDC MSMT LEADCHNL RV PACING THRESHOLD PULSEWIDTH: 0.5 ms
MDC IDC MSMT LEADCHNL RV SENSING INTR AMPL: 10.6 mV
MDC IDC SET LEADCHNL RA PACING AMPLITUDE: 1.625
MDC IDC SET LEADCHNL RV PACING AMPLITUDE: 2.5 V
MDC IDC STAT BRADY AP VP PERCENT: 1 %
MDC IDC STAT BRADY AP VS PERCENT: 2.8 %
Pulse Gen Model: 2272
Pulse Gen Serial Number: 7904591

## 2017-09-25 ENCOUNTER — Ambulatory Visit (INDEPENDENT_AMBULATORY_CARE_PROVIDER_SITE_OTHER): Payer: BLUE CROSS/BLUE SHIELD | Admitting: *Deleted

## 2017-09-25 DIAGNOSIS — I442 Atrioventricular block, complete: Secondary | ICD-10-CM

## 2017-09-25 NOTE — Progress Notes (Signed)
Remote pacemaker transmission.   

## 2017-10-28 LAB — CUP PACEART REMOTE DEVICE CHECK
Battery Remaining Percentage: 95.5 %
Brady Statistic AP VP Percent: 1 %
Brady Statistic AP VS Percent: 4.3 %
Brady Statistic RV Percent Paced: 1 %
Date Time Interrogation Session: 20190918085137
Implantable Lead Implant Date: 20170601
Implantable Lead Implant Date: 20170601
Implantable Lead Location: 753860
Implantable Pulse Generator Implant Date: 20170601
Lead Channel Impedance Value: 480 Ohm
Lead Channel Pacing Threshold Amplitude: 0.75 V
Lead Channel Sensing Intrinsic Amplitude: 4.3 mV
Lead Channel Setting Pacing Amplitude: 1.75 V
Lead Channel Setting Pacing Pulse Width: 0.5 ms
Lead Channel Setting Sensing Sensitivity: 2 mV
MDC IDC LEAD LOCATION: 753859
MDC IDC MSMT BATTERY REMAINING LONGEVITY: 127 mo
MDC IDC MSMT BATTERY VOLTAGE: 3.02 V
MDC IDC MSMT LEADCHNL RA IMPEDANCE VALUE: 460 Ohm
MDC IDC MSMT LEADCHNL RA PACING THRESHOLD AMPLITUDE: 0.75 V
MDC IDC MSMT LEADCHNL RA PACING THRESHOLD PULSEWIDTH: 0.5 ms
MDC IDC MSMT LEADCHNL RV PACING THRESHOLD PULSEWIDTH: 0.5 ms
MDC IDC MSMT LEADCHNL RV SENSING INTR AMPL: 10 mV
MDC IDC SET LEADCHNL RV PACING AMPLITUDE: 2.5 V
MDC IDC STAT BRADY AS VP PERCENT: 1 %
MDC IDC STAT BRADY AS VS PERCENT: 95 %
MDC IDC STAT BRADY RA PERCENT PACED: 4.3 %
Pulse Gen Model: 2272
Pulse Gen Serial Number: 7904591

## 2017-12-25 ENCOUNTER — Ambulatory Visit (INDEPENDENT_AMBULATORY_CARE_PROVIDER_SITE_OTHER): Payer: BLUE CROSS/BLUE SHIELD

## 2017-12-25 DIAGNOSIS — I495 Sick sinus syndrome: Secondary | ICD-10-CM

## 2017-12-25 DIAGNOSIS — I442 Atrioventricular block, complete: Secondary | ICD-10-CM

## 2017-12-25 NOTE — Progress Notes (Signed)
Remote pacemaker transmission.   

## 2017-12-26 ENCOUNTER — Encounter: Payer: Self-pay | Admitting: Cardiology

## 2018-01-25 LAB — CUP PACEART REMOTE DEVICE CHECK
Battery Remaining Percentage: 95.5 %
Brady Statistic AP VP Percent: 1 %
Brady Statistic AP VS Percent: 3.8 %
Brady Statistic RV Percent Paced: 1 %
Date Time Interrogation Session: 20191218070013
Implantable Lead Implant Date: 20170601
Implantable Lead Implant Date: 20170601
Implantable Lead Location: 753860
Implantable Pulse Generator Implant Date: 20170601
Lead Channel Impedance Value: 540 Ohm
Lead Channel Pacing Threshold Amplitude: 0.75 V
Lead Channel Sensing Intrinsic Amplitude: 3.6 mV
Lead Channel Sensing Intrinsic Amplitude: 9.7 mV
Lead Channel Setting Pacing Amplitude: 1.625
Lead Channel Setting Pacing Amplitude: 2.5 V
Lead Channel Setting Pacing Pulse Width: 0.5 ms
Lead Channel Setting Sensing Sensitivity: 2 mV
MDC IDC LEAD LOCATION: 753859
MDC IDC MSMT BATTERY REMAINING LONGEVITY: 128 mo
MDC IDC MSMT BATTERY VOLTAGE: 3.02 V
MDC IDC MSMT LEADCHNL RA IMPEDANCE VALUE: 480 Ohm
MDC IDC MSMT LEADCHNL RA PACING THRESHOLD AMPLITUDE: 0.625 V
MDC IDC MSMT LEADCHNL RA PACING THRESHOLD PULSEWIDTH: 0.5 ms
MDC IDC MSMT LEADCHNL RV PACING THRESHOLD PULSEWIDTH: 0.5 ms
MDC IDC STAT BRADY AS VP PERCENT: 1 %
MDC IDC STAT BRADY AS VS PERCENT: 96 %
MDC IDC STAT BRADY RA PERCENT PACED: 3.7 %
Pulse Gen Model: 2272
Pulse Gen Serial Number: 7904591

## 2018-03-26 ENCOUNTER — Ambulatory Visit (INDEPENDENT_AMBULATORY_CARE_PROVIDER_SITE_OTHER): Payer: BLUE CROSS/BLUE SHIELD | Admitting: *Deleted

## 2018-03-26 ENCOUNTER — Other Ambulatory Visit: Payer: Self-pay

## 2018-03-26 DIAGNOSIS — I442 Atrioventricular block, complete: Secondary | ICD-10-CM | POA: Diagnosis not present

## 2018-03-26 LAB — CUP PACEART REMOTE DEVICE CHECK
Battery Remaining Longevity: 127 mo
Battery Voltage: 3.02 V
Brady Statistic AP VP Percent: 1 %
Brady Statistic AS VS Percent: 96 %
Date Time Interrogation Session: 20200318060032
Implantable Lead Implant Date: 20170601
Implantable Lead Location: 753859
Lead Channel Impedance Value: 460 Ohm
Lead Channel Impedance Value: 480 Ohm
Lead Channel Pacing Threshold Amplitude: 0.625 V
Lead Channel Pacing Threshold Pulse Width: 0.5 ms
Lead Channel Sensing Intrinsic Amplitude: 5 mV
Lead Channel Setting Pacing Amplitude: 2.5 V
MDC IDC LEAD IMPLANT DT: 20170601
MDC IDC LEAD LOCATION: 753860
MDC IDC MSMT BATTERY REMAINING PERCENTAGE: 95.5 %
MDC IDC MSMT LEADCHNL RA PACING THRESHOLD PULSEWIDTH: 0.5 ms
MDC IDC MSMT LEADCHNL RV PACING THRESHOLD AMPLITUDE: 0.75 V
MDC IDC MSMT LEADCHNL RV SENSING INTR AMPL: 8.8 mV
MDC IDC PG IMPLANT DT: 20170601
MDC IDC PG SERIAL: 7904591
MDC IDC SET LEADCHNL RA PACING AMPLITUDE: 1.625
MDC IDC SET LEADCHNL RV PACING PULSEWIDTH: 0.5 ms
MDC IDC SET LEADCHNL RV SENSING SENSITIVITY: 2 mV
MDC IDC STAT BRADY AP VS PERCENT: 3.4 %
MDC IDC STAT BRADY AS VP PERCENT: 1 %
MDC IDC STAT BRADY RA PERCENT PACED: 3.4 %
MDC IDC STAT BRADY RV PERCENT PACED: 1 %

## 2018-04-03 ENCOUNTER — Encounter: Payer: Self-pay | Admitting: Cardiology

## 2018-04-03 NOTE — Progress Notes (Signed)
Remote pacemaker transmission.   

## 2018-06-25 ENCOUNTER — Ambulatory Visit (INDEPENDENT_AMBULATORY_CARE_PROVIDER_SITE_OTHER): Payer: BC Managed Care – PPO | Admitting: *Deleted

## 2018-06-25 DIAGNOSIS — I442 Atrioventricular block, complete: Secondary | ICD-10-CM | POA: Diagnosis not present

## 2018-06-25 LAB — CUP PACEART REMOTE DEVICE CHECK
Battery Remaining Longevity: 126 mo
Battery Remaining Percentage: 95.5 %
Battery Voltage: 3.02 V
Brady Statistic AP VP Percent: 1 %
Brady Statistic AP VS Percent: 4 %
Brady Statistic AS VP Percent: 1 %
Brady Statistic AS VS Percent: 96 %
Brady Statistic RA Percent Paced: 3.9 %
Brady Statistic RV Percent Paced: 1 %
Date Time Interrogation Session: 20200617070201
Implantable Lead Implant Date: 20170601
Implantable Lead Implant Date: 20170601
Implantable Lead Location: 753859
Implantable Lead Location: 753860
Implantable Pulse Generator Implant Date: 20170601
Lead Channel Impedance Value: 440 Ohm
Lead Channel Impedance Value: 460 Ohm
Lead Channel Pacing Threshold Amplitude: 0.625 V
Lead Channel Pacing Threshold Amplitude: 0.75 V
Lead Channel Pacing Threshold Pulse Width: 0.5 ms
Lead Channel Pacing Threshold Pulse Width: 0.5 ms
Lead Channel Sensing Intrinsic Amplitude: 4 mV
Lead Channel Sensing Intrinsic Amplitude: 9.9 mV
Lead Channel Setting Pacing Amplitude: 1.625
Lead Channel Setting Pacing Amplitude: 2.5 V
Lead Channel Setting Pacing Pulse Width: 0.5 ms
Lead Channel Setting Sensing Sensitivity: 2 mV
Pulse Gen Model: 2272
Pulse Gen Serial Number: 7904591

## 2018-07-07 ENCOUNTER — Encounter: Payer: Self-pay | Admitting: Cardiology

## 2018-07-07 NOTE — Progress Notes (Signed)
Remote pacemaker transmission.   

## 2018-09-24 ENCOUNTER — Ambulatory Visit (INDEPENDENT_AMBULATORY_CARE_PROVIDER_SITE_OTHER): Payer: BC Managed Care – PPO | Admitting: *Deleted

## 2018-09-24 DIAGNOSIS — I495 Sick sinus syndrome: Secondary | ICD-10-CM

## 2018-09-24 LAB — CUP PACEART REMOTE DEVICE CHECK
Battery Remaining Longevity: 127 mo
Battery Remaining Percentage: 95.5 %
Battery Voltage: 3.01 V
Brady Statistic AP VP Percent: 1 %
Brady Statistic AP VS Percent: 3.9 %
Brady Statistic AS VP Percent: 1 %
Brady Statistic AS VS Percent: 96 %
Brady Statistic RA Percent Paced: 3.8 %
Brady Statistic RV Percent Paced: 1 %
Date Time Interrogation Session: 20200916074204
Implantable Lead Implant Date: 20170601
Implantable Lead Implant Date: 20170601
Implantable Lead Location: 753859
Implantable Lead Location: 753860
Implantable Pulse Generator Implant Date: 20170601
Lead Channel Impedance Value: 460 Ohm
Lead Channel Impedance Value: 460 Ohm
Lead Channel Pacing Threshold Amplitude: 0.625 V
Lead Channel Pacing Threshold Amplitude: 0.75 V
Lead Channel Pacing Threshold Pulse Width: 0.5 ms
Lead Channel Pacing Threshold Pulse Width: 0.5 ms
Lead Channel Sensing Intrinsic Amplitude: 2.1 mV
Lead Channel Sensing Intrinsic Amplitude: 9 mV
Lead Channel Setting Pacing Amplitude: 1.625
Lead Channel Setting Pacing Amplitude: 2.5 V
Lead Channel Setting Pacing Pulse Width: 0.5 ms
Lead Channel Setting Sensing Sensitivity: 2 mV
Pulse Gen Model: 2272
Pulse Gen Serial Number: 7904591

## 2018-09-30 ENCOUNTER — Encounter: Payer: Self-pay | Admitting: Cardiology

## 2018-09-30 NOTE — Progress Notes (Signed)
Remote pacemaker transmission.   

## 2018-12-24 ENCOUNTER — Ambulatory Visit (INDEPENDENT_AMBULATORY_CARE_PROVIDER_SITE_OTHER): Payer: BC Managed Care – PPO | Admitting: *Deleted

## 2018-12-24 DIAGNOSIS — I442 Atrioventricular block, complete: Secondary | ICD-10-CM

## 2018-12-24 LAB — CUP PACEART REMOTE DEVICE CHECK
Battery Remaining Longevity: 127 mo
Battery Remaining Percentage: 95.5 %
Battery Voltage: 3.01 V
Brady Statistic AP VP Percent: 1 %
Brady Statistic AP VS Percent: 3.6 %
Brady Statistic AS VP Percent: 1 %
Brady Statistic AS VS Percent: 96 %
Brady Statistic RA Percent Paced: 3.5 %
Brady Statistic RV Percent Paced: 1 %
Date Time Interrogation Session: 20201216020014
Implantable Lead Implant Date: 20170601
Implantable Lead Implant Date: 20170601
Implantable Lead Location: 753859
Implantable Lead Location: 753860
Implantable Pulse Generator Implant Date: 20170601
Lead Channel Impedance Value: 450 Ohm
Lead Channel Impedance Value: 480 Ohm
Lead Channel Pacing Threshold Amplitude: 0.625 V
Lead Channel Pacing Threshold Amplitude: 0.75 V
Lead Channel Pacing Threshold Pulse Width: 0.5 ms
Lead Channel Pacing Threshold Pulse Width: 0.5 ms
Lead Channel Sensing Intrinsic Amplitude: 3.3 mV
Lead Channel Sensing Intrinsic Amplitude: 9.1 mV
Lead Channel Setting Pacing Amplitude: 1.625
Lead Channel Setting Pacing Amplitude: 2.5 V
Lead Channel Setting Pacing Pulse Width: 0.5 ms
Lead Channel Setting Sensing Sensitivity: 2 mV
Pulse Gen Model: 2272
Pulse Gen Serial Number: 7904591

## 2019-01-14 NOTE — Progress Notes (Signed)
PPM Remote  

## 2019-02-03 ENCOUNTER — Encounter: Payer: Self-pay | Admitting: Cardiology

## 2019-03-25 ENCOUNTER — Ambulatory Visit (INDEPENDENT_AMBULATORY_CARE_PROVIDER_SITE_OTHER): Payer: BLUE CROSS/BLUE SHIELD | Admitting: *Deleted

## 2019-03-25 DIAGNOSIS — I442 Atrioventricular block, complete: Secondary | ICD-10-CM

## 2019-03-25 LAB — CUP PACEART REMOTE DEVICE CHECK
Battery Remaining Longevity: 126 mo
Battery Remaining Percentage: 95.5 %
Battery Voltage: 3.01 V
Brady Statistic AP VP Percent: 1 %
Brady Statistic AP VS Percent: 3.2 %
Brady Statistic AS VP Percent: 1 %
Brady Statistic AS VS Percent: 96 %
Brady Statistic RA Percent Paced: 3.1 %
Brady Statistic RV Percent Paced: 1 %
Date Time Interrogation Session: 20210317020015
Implantable Lead Implant Date: 20170601
Implantable Lead Implant Date: 20170601
Implantable Lead Location: 753859
Implantable Lead Location: 753860
Implantable Pulse Generator Implant Date: 20170601
Lead Channel Impedance Value: 430 Ohm
Lead Channel Impedance Value: 460 Ohm
Lead Channel Pacing Threshold Amplitude: 0.625 V
Lead Channel Pacing Threshold Amplitude: 0.75 V
Lead Channel Pacing Threshold Pulse Width: 0.5 ms
Lead Channel Pacing Threshold Pulse Width: 0.5 ms
Lead Channel Sensing Intrinsic Amplitude: 2.9 mV
Lead Channel Sensing Intrinsic Amplitude: 8.2 mV
Lead Channel Setting Pacing Amplitude: 1.625
Lead Channel Setting Pacing Amplitude: 2.5 V
Lead Channel Setting Pacing Pulse Width: 0.5 ms
Lead Channel Setting Sensing Sensitivity: 2 mV
Pulse Gen Model: 2272
Pulse Gen Serial Number: 7904591

## 2019-03-26 NOTE — Progress Notes (Signed)
PPM Remote  

## 2019-06-24 ENCOUNTER — Ambulatory Visit (INDEPENDENT_AMBULATORY_CARE_PROVIDER_SITE_OTHER): Payer: BLUE CROSS/BLUE SHIELD | Admitting: *Deleted

## 2019-06-24 DIAGNOSIS — I442 Atrioventricular block, complete: Secondary | ICD-10-CM

## 2019-06-24 LAB — CUP PACEART REMOTE DEVICE CHECK
Battery Remaining Longevity: 125 mo
Battery Remaining Percentage: 95.5 %
Battery Voltage: 3.01 V
Brady Statistic AP VP Percent: 1 %
Brady Statistic AP VS Percent: 3 %
Brady Statistic AS VP Percent: 1 %
Brady Statistic AS VS Percent: 96 %
Brady Statistic RA Percent Paced: 2.8 %
Brady Statistic RV Percent Paced: 1 %
Date Time Interrogation Session: 20210616035137
Implantable Lead Implant Date: 20170601
Implantable Lead Implant Date: 20170601
Implantable Lead Location: 753859
Implantable Lead Location: 753860
Implantable Pulse Generator Implant Date: 20170601
Lead Channel Impedance Value: 410 Ohm
Lead Channel Impedance Value: 450 Ohm
Lead Channel Pacing Threshold Amplitude: 0.625 V
Lead Channel Pacing Threshold Amplitude: 0.75 V
Lead Channel Pacing Threshold Pulse Width: 0.5 ms
Lead Channel Pacing Threshold Pulse Width: 0.5 ms
Lead Channel Sensing Intrinsic Amplitude: 4 mV
Lead Channel Sensing Intrinsic Amplitude: 9.1 mV
Lead Channel Setting Pacing Amplitude: 1.625
Lead Channel Setting Pacing Amplitude: 2.5 V
Lead Channel Setting Pacing Pulse Width: 0.5 ms
Lead Channel Setting Sensing Sensitivity: 2 mV
Pulse Gen Model: 2272
Pulse Gen Serial Number: 7904591

## 2019-06-25 NOTE — Progress Notes (Signed)
Remote pacemaker transmission.   

## 2019-09-23 ENCOUNTER — Ambulatory Visit (INDEPENDENT_AMBULATORY_CARE_PROVIDER_SITE_OTHER): Payer: BLUE CROSS/BLUE SHIELD | Admitting: *Deleted

## 2019-09-23 DIAGNOSIS — I495 Sick sinus syndrome: Secondary | ICD-10-CM

## 2019-09-23 LAB — CUP PACEART REMOTE DEVICE CHECK
Battery Remaining Longevity: 125 mo
Battery Remaining Percentage: 95.5 %
Battery Voltage: 3.01 V
Brady Statistic AP VP Percent: 1 %
Brady Statistic AP VS Percent: 2.9 %
Brady Statistic AS VP Percent: 1 %
Brady Statistic AS VS Percent: 96 %
Brady Statistic RA Percent Paced: 2.7 %
Brady Statistic RV Percent Paced: 1 %
Date Time Interrogation Session: 20210915020013
Implantable Lead Implant Date: 20170601
Implantable Lead Implant Date: 20170601
Implantable Lead Location: 753859
Implantable Lead Location: 753860
Implantable Pulse Generator Implant Date: 20170601
Lead Channel Impedance Value: 430 Ohm
Lead Channel Impedance Value: 460 Ohm
Lead Channel Pacing Threshold Amplitude: 0.75 V
Lead Channel Pacing Threshold Amplitude: 0.75 V
Lead Channel Pacing Threshold Pulse Width: 0.5 ms
Lead Channel Pacing Threshold Pulse Width: 0.5 ms
Lead Channel Sensing Intrinsic Amplitude: 4.4 mV
Lead Channel Sensing Intrinsic Amplitude: 9.5 mV
Lead Channel Setting Pacing Amplitude: 1.75 V
Lead Channel Setting Pacing Amplitude: 2.5 V
Lead Channel Setting Pacing Pulse Width: 0.5 ms
Lead Channel Setting Sensing Sensitivity: 2 mV
Pulse Gen Model: 2272
Pulse Gen Serial Number: 7904591

## 2019-09-25 NOTE — Progress Notes (Signed)
Remote pacemaker transmission.   

## 2019-12-23 LAB — CUP PACEART REMOTE DEVICE CHECK
Battery Remaining Longevity: 125 mo
Battery Remaining Percentage: 95.5 %
Battery Voltage: 3.01 V
Brady Statistic AP VP Percent: 1 %
Brady Statistic AP VS Percent: 2.8 %
Brady Statistic AS VP Percent: 1 %
Brady Statistic AS VS Percent: 96 %
Brady Statistic RA Percent Paced: 2.7 %
Brady Statistic RV Percent Paced: 1 %
Date Time Interrogation Session: 20211215020015
Implantable Lead Implant Date: 20170601
Implantable Lead Implant Date: 20170601
Implantable Lead Location: 753859
Implantable Lead Location: 753860
Implantable Pulse Generator Implant Date: 20170601
Lead Channel Impedance Value: 410 Ohm
Lead Channel Impedance Value: 440 Ohm
Lead Channel Pacing Threshold Amplitude: 0.75 V
Lead Channel Pacing Threshold Amplitude: 0.75 V
Lead Channel Pacing Threshold Pulse Width: 0.5 ms
Lead Channel Pacing Threshold Pulse Width: 0.5 ms
Lead Channel Sensing Intrinsic Amplitude: 3 mV
Lead Channel Sensing Intrinsic Amplitude: 8.6 mV
Lead Channel Setting Pacing Amplitude: 1.75 V
Lead Channel Setting Pacing Amplitude: 2.5 V
Lead Channel Setting Pacing Pulse Width: 0.5 ms
Lead Channel Setting Sensing Sensitivity: 2 mV
Pulse Gen Model: 2272
Pulse Gen Serial Number: 7904591

## 2020-03-23 ENCOUNTER — Ambulatory Visit (INDEPENDENT_AMBULATORY_CARE_PROVIDER_SITE_OTHER): Payer: BLUE CROSS/BLUE SHIELD

## 2020-03-23 DIAGNOSIS — I495 Sick sinus syndrome: Secondary | ICD-10-CM | POA: Diagnosis not present

## 2020-03-24 LAB — CUP PACEART REMOTE DEVICE CHECK
Battery Remaining Longevity: 125 mo
Battery Remaining Percentage: 95.5 %
Battery Voltage: 3.01 V
Brady Statistic AP VP Percent: 1 %
Brady Statistic AP VS Percent: 2.8 %
Brady Statistic AS VP Percent: 1.1 %
Brady Statistic AS VS Percent: 96 %
Brady Statistic RA Percent Paced: 2.7 %
Brady Statistic RV Percent Paced: 1.2 %
Date Time Interrogation Session: 20220316220425
Implantable Lead Implant Date: 20170601
Implantable Lead Implant Date: 20170601
Implantable Lead Location: 753859
Implantable Lead Location: 753860
Implantable Pulse Generator Implant Date: 20170601
Lead Channel Impedance Value: 410 Ohm
Lead Channel Impedance Value: 450 Ohm
Lead Channel Pacing Threshold Amplitude: 0.75 V
Lead Channel Pacing Threshold Amplitude: 0.75 V
Lead Channel Pacing Threshold Pulse Width: 0.5 ms
Lead Channel Pacing Threshold Pulse Width: 0.5 ms
Lead Channel Sensing Intrinsic Amplitude: 2.6 mV
Lead Channel Sensing Intrinsic Amplitude: 6.7 mV
Lead Channel Setting Pacing Amplitude: 1.75 V
Lead Channel Setting Pacing Amplitude: 2.5 V
Lead Channel Setting Pacing Pulse Width: 0.5 ms
Lead Channel Setting Sensing Sensitivity: 2 mV
Pulse Gen Model: 2272
Pulse Gen Serial Number: 7904591

## 2020-03-31 NOTE — Progress Notes (Signed)
Remote pacemaker transmission.

## 2020-06-22 ENCOUNTER — Ambulatory Visit (INDEPENDENT_AMBULATORY_CARE_PROVIDER_SITE_OTHER): Payer: Medicare HMO

## 2020-06-22 DIAGNOSIS — I495 Sick sinus syndrome: Secondary | ICD-10-CM

## 2020-06-24 LAB — CUP PACEART REMOTE DEVICE CHECK
Battery Remaining Longevity: 125 mo
Battery Remaining Percentage: 95.5 %
Battery Voltage: 3.01 V
Brady Statistic AP VP Percent: 1 %
Brady Statistic AP VS Percent: 2.6 %
Brady Statistic AS VP Percent: 1.2 %
Brady Statistic AS VS Percent: 96 %
Brady Statistic RA Percent Paced: 2.5 %
Brady Statistic RV Percent Paced: 1.3 %
Date Time Interrogation Session: 20220615020032
Implantable Lead Implant Date: 20170601
Implantable Lead Implant Date: 20170601
Implantable Lead Location: 753859
Implantable Lead Location: 753860
Implantable Pulse Generator Implant Date: 20170601
Lead Channel Impedance Value: 440 Ohm
Lead Channel Impedance Value: 450 Ohm
Lead Channel Pacing Threshold Amplitude: 0.75 V
Lead Channel Pacing Threshold Amplitude: 0.75 V
Lead Channel Pacing Threshold Pulse Width: 0.5 ms
Lead Channel Pacing Threshold Pulse Width: 0.5 ms
Lead Channel Sensing Intrinsic Amplitude: 2.6 mV
Lead Channel Sensing Intrinsic Amplitude: 7 mV
Lead Channel Setting Pacing Amplitude: 1.75 V
Lead Channel Setting Pacing Amplitude: 2.5 V
Lead Channel Setting Pacing Pulse Width: 0.5 ms
Lead Channel Setting Sensing Sensitivity: 2 mV
Pulse Gen Model: 2272
Pulse Gen Serial Number: 7904591

## 2020-07-14 NOTE — Progress Notes (Signed)
Remote pacemaker transmission.   

## 2020-09-21 ENCOUNTER — Ambulatory Visit (INDEPENDENT_AMBULATORY_CARE_PROVIDER_SITE_OTHER): Payer: Medicare HMO

## 2020-09-21 DIAGNOSIS — I495 Sick sinus syndrome: Secondary | ICD-10-CM | POA: Diagnosis not present

## 2020-09-21 LAB — CUP PACEART REMOTE DEVICE CHECK
Battery Remaining Longevity: 72 mo
Battery Remaining Percentage: 58 %
Battery Voltage: 3.01 V
Brady Statistic AP VP Percent: 1 %
Brady Statistic AP VS Percent: 2.5 %
Brady Statistic AS VP Percent: 1.3 %
Brady Statistic AS VS Percent: 96 %
Brady Statistic RA Percent Paced: 2.4 %
Brady Statistic RV Percent Paced: 1.4 %
Date Time Interrogation Session: 20220914020015
Implantable Lead Implant Date: 20170601
Implantable Lead Implant Date: 20170601
Implantable Lead Location: 753859
Implantable Lead Location: 753860
Implantable Pulse Generator Implant Date: 20170601
Lead Channel Impedance Value: 480 Ohm
Lead Channel Impedance Value: 530 Ohm
Lead Channel Pacing Threshold Amplitude: 0.625 V
Lead Channel Pacing Threshold Amplitude: 0.75 V
Lead Channel Pacing Threshold Pulse Width: 0.5 ms
Lead Channel Pacing Threshold Pulse Width: 0.5 ms
Lead Channel Sensing Intrinsic Amplitude: 3 mV
Lead Channel Sensing Intrinsic Amplitude: 9.3 mV
Lead Channel Setting Pacing Amplitude: 1.625
Lead Channel Setting Pacing Amplitude: 2.5 V
Lead Channel Setting Pacing Pulse Width: 0.5 ms
Lead Channel Setting Sensing Sensitivity: 2 mV
Pulse Gen Model: 2272
Pulse Gen Serial Number: 7904591

## 2020-09-28 NOTE — Progress Notes (Signed)
Remote pacemaker transmission.   

## 2020-12-21 ENCOUNTER — Ambulatory Visit (INDEPENDENT_AMBULATORY_CARE_PROVIDER_SITE_OTHER): Payer: Medicare HMO

## 2020-12-21 DIAGNOSIS — I442 Atrioventricular block, complete: Secondary | ICD-10-CM

## 2020-12-21 LAB — CUP PACEART REMOTE DEVICE CHECK
Battery Remaining Longevity: 69 mo
Battery Remaining Percentage: 56 %
Battery Voltage: 3.01 V
Brady Statistic AP VP Percent: 1 %
Brady Statistic AP VS Percent: 2.5 %
Brady Statistic AS VP Percent: 1.4 %
Brady Statistic AS VS Percent: 96 %
Brady Statistic RA Percent Paced: 2.4 %
Brady Statistic RV Percent Paced: 1.5 %
Date Time Interrogation Session: 20221214020022
Implantable Lead Implant Date: 20170601
Implantable Lead Implant Date: 20170601
Implantable Lead Location: 753859
Implantable Lead Location: 753860
Implantable Pulse Generator Implant Date: 20170601
Lead Channel Impedance Value: 450 Ohm
Lead Channel Impedance Value: 480 Ohm
Lead Channel Pacing Threshold Amplitude: 0.75 V
Lead Channel Pacing Threshold Amplitude: 0.75 V
Lead Channel Pacing Threshold Pulse Width: 0.5 ms
Lead Channel Pacing Threshold Pulse Width: 0.5 ms
Lead Channel Sensing Intrinsic Amplitude: 2.3 mV
Lead Channel Sensing Intrinsic Amplitude: 7.5 mV
Lead Channel Setting Pacing Amplitude: 1.75 V
Lead Channel Setting Pacing Amplitude: 2.5 V
Lead Channel Setting Pacing Pulse Width: 0.5 ms
Lead Channel Setting Sensing Sensitivity: 2 mV
Pulse Gen Model: 2272
Pulse Gen Serial Number: 7904591

## 2021-01-03 NOTE — Progress Notes (Signed)
Remote pacemaker transmission.   

## 2021-03-22 ENCOUNTER — Ambulatory Visit (INDEPENDENT_AMBULATORY_CARE_PROVIDER_SITE_OTHER): Payer: Medicare HMO

## 2021-03-22 DIAGNOSIS — I442 Atrioventricular block, complete: Secondary | ICD-10-CM

## 2021-03-22 LAB — CUP PACEART REMOTE DEVICE CHECK
Battery Remaining Longevity: 66 mo
Battery Remaining Percentage: 54 %
Battery Voltage: 2.99 V
Brady Statistic AP VP Percent: 1 %
Brady Statistic AP VS Percent: 2.4 %
Brady Statistic AS VP Percent: 1.5 %
Brady Statistic AS VS Percent: 96 %
Brady Statistic RA Percent Paced: 2.3 %
Brady Statistic RV Percent Paced: 1.7 %
Date Time Interrogation Session: 20230315020014
Implantable Lead Implant Date: 20170601
Implantable Lead Implant Date: 20170601
Implantable Lead Location: 753859
Implantable Lead Location: 753860
Implantable Pulse Generator Implant Date: 20170601
Lead Channel Impedance Value: 440 Ohm
Lead Channel Impedance Value: 480 Ohm
Lead Channel Pacing Threshold Amplitude: 0.625 V
Lead Channel Pacing Threshold Amplitude: 0.75 V
Lead Channel Pacing Threshold Pulse Width: 0.5 ms
Lead Channel Pacing Threshold Pulse Width: 0.5 ms
Lead Channel Sensing Intrinsic Amplitude: 3.2 mV
Lead Channel Sensing Intrinsic Amplitude: 8.8 mV
Lead Channel Setting Pacing Amplitude: 1.625
Lead Channel Setting Pacing Amplitude: 2.5 V
Lead Channel Setting Pacing Pulse Width: 0.5 ms
Lead Channel Setting Sensing Sensitivity: 2 mV
Pulse Gen Model: 2272
Pulse Gen Serial Number: 7904591

## 2021-04-03 NOTE — Progress Notes (Signed)
Remote pacemaker transmission.   

## 2021-06-10 ENCOUNTER — Encounter (HOSPITAL_COMMUNITY): Payer: Self-pay | Admitting: Emergency Medicine

## 2021-06-10 ENCOUNTER — Other Ambulatory Visit: Payer: Self-pay

## 2021-06-10 ENCOUNTER — Emergency Department (HOSPITAL_COMMUNITY): Payer: Medicare HMO

## 2021-06-10 ENCOUNTER — Inpatient Hospital Stay (HOSPITAL_COMMUNITY)
Admission: EM | Admit: 2021-06-10 | Discharge: 2021-06-13 | DRG: 386 | Disposition: A | Discharge status: Home / Self Care | Payer: Medicare HMO | Attending: Internal Medicine | Admitting: Internal Medicine

## 2021-06-10 DIAGNOSIS — D61818 Other pancytopenia: Secondary | ICD-10-CM | POA: Diagnosis present

## 2021-06-10 DIAGNOSIS — Z888 Allergy status to other drugs, medicaments and biological substances status: Secondary | ICD-10-CM

## 2021-06-10 DIAGNOSIS — Z79899 Other long term (current) drug therapy: Secondary | ICD-10-CM | POA: Diagnosis not present

## 2021-06-10 DIAGNOSIS — Z801 Family history of malignant neoplasm of trachea, bronchus and lung: Secondary | ICD-10-CM | POA: Diagnosis not present

## 2021-06-10 DIAGNOSIS — K50112 Crohn's disease of large intestine with intestinal obstruction: Secondary | ICD-10-CM | POA: Diagnosis present

## 2021-06-10 DIAGNOSIS — K529 Noninfective gastroenteritis and colitis, unspecified: Secondary | ICD-10-CM | POA: Diagnosis not present

## 2021-06-10 DIAGNOSIS — E871 Hypo-osmolality and hyponatremia: Secondary | ICD-10-CM | POA: Diagnosis not present

## 2021-06-10 DIAGNOSIS — C9110 Chronic lymphocytic leukemia of B-cell type not having achieved remission: Secondary | ICD-10-CM | POA: Diagnosis present

## 2021-06-10 DIAGNOSIS — Z884 Allergy status to anesthetic agent status: Secondary | ICD-10-CM

## 2021-06-10 DIAGNOSIS — K50819 Crohn's disease of both small and large intestine with unspecified complications: Secondary | ICD-10-CM

## 2021-06-10 DIAGNOSIS — I10 Essential (primary) hypertension: Secondary | ICD-10-CM | POA: Diagnosis present

## 2021-06-10 DIAGNOSIS — K56600 Partial intestinal obstruction, unspecified as to cause: Secondary | ICD-10-CM | POA: Diagnosis not present

## 2021-06-10 DIAGNOSIS — K50812 Crohn's disease of both small and large intestine with intestinal obstruction: Principal | ICD-10-CM | POA: Diagnosis present

## 2021-06-10 DIAGNOSIS — G4733 Obstructive sleep apnea (adult) (pediatric): Secondary | ICD-10-CM | POA: Diagnosis present

## 2021-06-10 DIAGNOSIS — Z66 Do not resuscitate: Secondary | ICD-10-CM | POA: Diagnosis present

## 2021-06-10 DIAGNOSIS — K56609 Unspecified intestinal obstruction, unspecified as to partial versus complete obstruction: Secondary | ICD-10-CM | POA: Diagnosis not present

## 2021-06-10 DIAGNOSIS — L719 Rosacea, unspecified: Secondary | ICD-10-CM | POA: Diagnosis present

## 2021-06-10 DIAGNOSIS — Z91013 Allergy to seafood: Secondary | ICD-10-CM | POA: Diagnosis not present

## 2021-06-10 DIAGNOSIS — E785 Hyperlipidemia, unspecified: Secondary | ICD-10-CM | POA: Diagnosis present

## 2021-06-10 DIAGNOSIS — J45909 Unspecified asthma, uncomplicated: Secondary | ICD-10-CM | POA: Diagnosis present

## 2021-06-10 DIAGNOSIS — Z221 Carrier of other intestinal infectious diseases: Secondary | ICD-10-CM

## 2021-06-10 DIAGNOSIS — D72819 Decreased white blood cell count, unspecified: Principal | ICD-10-CM

## 2021-06-10 DIAGNOSIS — K50012 Crohn's disease of small intestine with intestinal obstruction: Secondary | ICD-10-CM | POA: Diagnosis not present

## 2021-06-10 DIAGNOSIS — R195 Other fecal abnormalities: Secondary | ICD-10-CM | POA: Diagnosis not present

## 2021-06-10 DIAGNOSIS — R1031 Right lower quadrant pain: Secondary | ICD-10-CM | POA: Diagnosis present

## 2021-06-10 DIAGNOSIS — K909 Intestinal malabsorption, unspecified: Secondary | ICD-10-CM | POA: Diagnosis present

## 2021-06-10 DIAGNOSIS — K760 Fatty (change of) liver, not elsewhere classified: Secondary | ICD-10-CM | POA: Diagnosis present

## 2021-06-10 DIAGNOSIS — Z950 Presence of cardiac pacemaker: Secondary | ICD-10-CM

## 2021-06-10 DIAGNOSIS — K219 Gastro-esophageal reflux disease without esophagitis: Secondary | ICD-10-CM | POA: Diagnosis present

## 2021-06-10 DIAGNOSIS — Z87891 Personal history of nicotine dependence: Secondary | ICD-10-CM | POA: Diagnosis not present

## 2021-06-10 DIAGNOSIS — I442 Atrioventricular block, complete: Secondary | ICD-10-CM | POA: Diagnosis present

## 2021-06-10 HISTORY — DX: Crohn's disease, unspecified, without complications: K50.90

## 2021-06-10 LAB — LIPASE, BLOOD: Lipase: 27 U/L (ref 11–51)

## 2021-06-10 LAB — CBC
HCT: 43.6 % (ref 39.0–52.0)
Hemoglobin: 14.6 g/dL (ref 13.0–17.0)
MCH: 31.3 pg (ref 26.0–34.0)
MCHC: 33.5 g/dL (ref 30.0–36.0)
MCV: 93.4 fL (ref 80.0–100.0)
Platelets: 129 10*3/uL — ABNORMAL LOW (ref 150–400)
RBC: 4.67 MIL/uL (ref 4.22–5.81)
RDW: 14.6 % (ref 11.5–15.5)
WBC: 3.3 10*3/uL — ABNORMAL LOW (ref 4.0–10.5)
nRBC: 0 % (ref 0.0–0.2)

## 2021-06-10 LAB — COMPREHENSIVE METABOLIC PANEL
ALT: 12 U/L (ref 0–44)
AST: 18 U/L (ref 15–41)
Albumin: 3.7 g/dL (ref 3.5–5.0)
Alkaline Phosphatase: 59 U/L (ref 38–126)
Anion gap: 10 (ref 5–15)
BUN: 14 mg/dL (ref 8–23)
CO2: 32 mmol/L (ref 22–32)
Calcium: 9.1 mg/dL (ref 8.9–10.3)
Chloride: 93 mmol/L — ABNORMAL LOW (ref 98–111)
Creatinine, Ser: 0.84 mg/dL (ref 0.61–1.24)
GFR, Estimated: >60 mL/min (ref >60)
Glucose, Bld: 121 mg/dL — ABNORMAL HIGH (ref 70–99)
Potassium: 3.9 mmol/L (ref 3.5–5.1)
Sodium: 135 mmol/L (ref 135–145)
Total Bilirubin: 1 mg/dL (ref 0.3–1.2)
Total Protein: 7.4 g/dL (ref 6.5–8.1)

## 2021-06-10 LAB — URINALYSIS, ROUTINE W REFLEX MICROSCOPIC
Bacteria, UA: NONE SEEN
Bilirubin Urine: NEGATIVE
Glucose, UA: NEGATIVE mg/dL
Hgb urine dipstick: NEGATIVE
Ketones, ur: NEGATIVE mg/dL
Leukocytes,Ua: NEGATIVE
Nitrite: NEGATIVE
Protein, ur: 30 mg/dL — AB
Specific Gravity, Urine: 1.021 (ref 1.005–1.030)
pH: 6 (ref 5.0–8.0)

## 2021-06-10 LAB — HIV ANTIBODY (ROUTINE TESTING W REFLEX): HIV Screen 4th Generation wRfx: NONREACTIVE

## 2021-06-10 LAB — C-REACTIVE PROTEIN: CRP: 1.5 mg/dL — ABNORMAL HIGH (ref <1.0)

## 2021-06-10 MED ORDER — METHYLPREDNISOLONE SODIUM SUCC 40 MG IJ SOLR
20.0000 mg | Freq: Two times a day (BID) | INTRAMUSCULAR | Status: DC
Start: 2021-06-10 — End: 2021-06-11
  Administered 2021-06-10 (×2): 20 mg via INTRAVENOUS
  Filled 2021-06-10 (×2): qty 1

## 2021-06-10 MED ORDER — DICYCLOMINE HCL 20 MG PO TABS: 20.0000 mg | ORAL_TABLET | Freq: Every evening | ORAL | Status: DC | PRN

## 2021-06-10 MED ORDER — MORPHINE SULFATE (PF) 2 MG/ML IV SOLN: 2.0000 mg | INTRAVENOUS | Status: DC | PRN

## 2021-06-10 MED ORDER — HYDRALAZINE HCL 20 MG/ML IJ SOLN: 10.0000 mg | INTRAMUSCULAR | Status: DC | PRN

## 2021-06-10 MED ORDER — ALBUTEROL SULFATE HFA 108 (90 BASE) MCG/ACT IN AERS: 2.0000 | INHALATION_SPRAY | Freq: Four times a day (QID) | RESPIRATORY_TRACT | Status: DC | PRN

## 2021-06-10 MED ORDER — MELATONIN 3 MG PO TABS
3.0000 mg | ORAL_TABLET | Freq: Every evening | ORAL | Status: DC | PRN
  Administered 2021-06-10 – 2021-06-12 (×3): 3 mg via ORAL
  Filled 2021-06-10 (×3): qty 1

## 2021-06-10 MED ORDER — ACETAMINOPHEN 650 MG RE SUPP: 650.0000 mg | Freq: Four times a day (QID) | RECTAL | Status: DC | PRN

## 2021-06-10 MED ORDER — ALBUTEROL SULFATE (2.5 MG/3ML) 0.083% IN NEBU: 3.0000 mL | INHALATION_SOLUTION | Freq: Four times a day (QID) | RESPIRATORY_TRACT | Status: DC | PRN

## 2021-06-10 MED ORDER — LACTATED RINGERS IV SOLN: INTRAVENOUS | Status: DC

## 2021-06-10 MED ORDER — AMLODIPINE BESYLATE 5 MG PO TABS
5.0000 mg | ORAL_TABLET | Freq: Every day | ORAL | Status: DC
  Administered 2021-06-10 – 2021-06-13 (×4): 5 mg via ORAL
  Filled 2021-06-10 (×4): qty 1

## 2021-06-10 MED ORDER — ONDANSETRON HCL 4 MG/2ML IJ SOLN: 4.0000 mg | Freq: Four times a day (QID) | INTRAMUSCULAR | Status: DC | PRN

## 2021-06-10 MED ORDER — OXYCODONE HCL 5 MG PO TABS: 5.0000 mg | ORAL_TABLET | ORAL | Status: DC | PRN

## 2021-06-10 MED ORDER — SODIUM CHLORIDE 0.9 % IV BOLUS
1000.0000 mL | Freq: Once | INTRAVENOUS | Status: AC
Start: 2021-06-10 — End: 2021-06-10
  Administered 2021-06-10: 1000 mL via INTRAVENOUS

## 2021-06-10 MED ORDER — ACETAMINOPHEN 325 MG PO TABS
650.0000 mg | ORAL_TABLET | Freq: Four times a day (QID) | ORAL | Status: DC | PRN
  Administered 2021-06-10 – 2021-06-12 (×3): 650 mg via ORAL
  Filled 2021-06-10 (×3): qty 2

## 2021-06-10 MED ORDER — IOHEXOL 300 MG/ML  SOLN
100.0000 mL | Freq: Once | INTRAMUSCULAR | Status: AC | PRN
  Administered 2021-06-10: 100 mL via INTRAVENOUS

## 2021-06-10 NOTE — H&P (Signed)
History and Physical    Patient: Maxwell Li GYI:[id number] DOB: 18-Nov-1955 DOA: 06/10/2021 DOS: the patient was seen and examined on 06/10/2021 PCP: Rubie Maid, MD  Patient coming from: Home - lives with son; NOK: Daughter, Karleen Hampshire, [phone]   Chief Complaint: GI symptoms  HPI: Maxwell Li is a 66 y.o. male with medical history significant of Crohn's disease; HLD; and HTN presenting with abdominal pain with n/v/d.  He reports having had gangrenous appendicitis about 60 years ago and IBD-type symptoms since.  He was diagnosed with Crohn's within the last 6-8 years.  His symptoms are reasonably controlled with mesalamine but he started yesterday with severe episodic abdominal pain (mostly in RLQ) as well as n/v/d.  It continued to worsen so he came to the ER.  Last BM was early this AM.    ER Course:   Crohn's, on mesalamine.  N/V/D yesterday.  CT with Crohn's flare and SBO.  GI consulted.  Will hold on surgery consult pending GI input.     Review of Systems: As mentioned in the history of present illness. All other systems reviewed and are negative. Past Medical History:  Diagnosis Date   Crohn's disease ([place])    DYSLIPIDEMIA 06/28/2009   Qualifier: Diagnosis of  By: Loanne Drilling MD, Sean A    GERD 09/04/2006   Qualifier: Diagnosis of  By: Loanne Drilling MD, Sean A    Hypertension    HYPOGONADISM, MALE 05/20/2008   Qualifier: Diagnosis of  By: Loanne Drilling MD, Sean A    KNEE PAIN 03/24/2007   Qualifier: Diagnosis of  By: Loanne Drilling MD, Sean A    Ventral hernia    Past Surgical History:  Procedure Laterality Date   APPENDECTOMY     ELECTROCARDIOGRAM  02/01/2006   EP IMPLANTABLE DEVICE N/A 06/09/2015   Procedure: Pacemaker Implant;  Surgeon: Will Meredith Leeds, MD;  Location: [facility];  Service: Cardiovascular;  Laterality: N/A;   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 He  has never used smokeless tobacco. He reports that he does not drink alcohol and does not use drugs.  Allergies  Allergen Reactions   Lidocaine Swelling    Throat swelling    Lovastatin     REACTION: Nausea   Shellfish Allergy Itching    Family History  Problem Relation Age of Onset   Cancer Brother        Lung Cancer    Prior to Admission medications   Medication Sig Start Date End Date Taking? Authorizing Provider  albuterol (PROVENTIL HFA;VENTOLIN HFA) 108 (90 Base) MCG/ACT inhaler Inhale 2 puffs into the lungs every 6 (six) hours as needed for wheezing or shortness of breath.    [provider]  DICYCLOMINE HCL PO Take 20 mg by mouth at bedtime as needed (stomach spasms).     [provider]  hydrocortisone cream 1 % Apply 1 application topically daily as needed (facial rash).    [provider]  ibuprofen (ADVIL,MOTRIN) 200 MG tablet Take 400 mg by mouth every 6 (six) hours as needed (knee pain). Reported on 06/22/2015    [provider]  metoprolol tartrate (LOPRESSOR) 25 MG tablet Take 1 tablet (25 mg total) by mouth 2 (two) times daily. 02/08/17 05/09/17  Baldwin Jamaica, PA-C  PRESCRIPTION MEDICATION Inhale into the lungs at bedtime. CPAP    [provider]  testosterone cypionate (DEPOTESTOTERONE  CYPIONATE) 200 MG/ML injection Inject 1 mL (200 mg total) into the muscle every 14 (fourteen) days. 04/28/13   Renato Shin, MD    Physical Exam: Vitals:   06/10/21 1015 06/10/21 1030 06/10/21 1045 06/10/21 1100  BP: 114/79 (!) 140/120 (!) 149/138 127/87  Pulse: 68 69 71 71  Resp: 15 (!) 22 10 19   Temp:      TempSrc:      SpO2: 93% 95% 94% 95%   General:  Appears calm and comfortable and is in NAD Eyes:  PERRL, EOMI, normal lids, iris ENT:  grossly normal hearing, lips & tongue, mmm Neck:  no LAD, masses or thyromegaly Cardiovascular:  RRR, no m/r/g. No LE edema.  Respiratory:   CTA bilaterally with no wheezes/rales/rhonchi.   Normal respiratory effort. Abdomen:  soft, mildly tender in RLQ, ND, hypoactive but present BS Skin:  scattered petechiae on face with nasal erythema Musculoskeletal:  grossly normal tone BUE/BLE, good ROM, no bony abnormality Psychiatric:  grossly normal mood and affect, speech fluent and appropriate, AOx3 Neurologic:  CN 2-12 grossly intact, moves all extremities in coordinated fashion   Radiological Exams on Admission: Independently reviewed - see discussion in A/P where applicable  CT ABDOMEN PELVIS W CONTRAST  Result Date: 06/10/2021 CLINICAL DATA:  Right lower quadrant pain. History of appendectomy. History of Crohn's disease. Vomiting and diarrhea. EXAM: CT ABDOMEN AND PELVIS WITH CONTRAST TECHNIQUE: Multidetector CT imaging of the abdomen and pelvis was performed using the standard protocol following bolus administration of intravenous contrast. RADIATION DOSE REDUCTION: This exam was performed according to the departmental dose-optimization program which includes automated exposure control, adjustment of the mA and/or kV according to patient size and/or use of iterative reconstruction technique. CONTRAST:  128m OMNIPAQUE IOHEXOL 300 MG/ML  SOLN COMPARISON:  March 24, 2007 FINDINGS: Lower chest: No acute abnormality. Hepatobiliary: Multiple tiny low-attenuation lesions in the liver too small to characterize but consistent with small cysts or hemangiomas. Mild hepatic steatosis. The gallbladder is normal. The portal vein is patent. Pancreas: Unremarkable. No pancreatic ductal dilatation or surrounding inflammatory changes. Spleen: Normal in size without focal abnormality. Adrenals/Urinary Tract: Adrenal glands are unremarkable. Kidneys are normal, without renal calculi, focal lesion, or hydronephrosis. Bladder is unremarkable. Stomach/Bowel: The stomach is normal. There is a small bowel obstruction with dilated loops of distal jejunum and proximal ileum. The transition point is in the ileum best  seen on sagittal image 7 and axial image 34. The ileum distal to the transition point demonstrates regions of wall thickening and mucosal enhancement. A representative image demonstrating mucosal enhancement is coronal image 74. Most of the colon is normal in caliber to mildly decompressed. There is fluid in the distal sigmoid colon and rectum. There is mucosal enhancement in the region of the rectum and distal sigmoid colon. By report, the patient is status post appendectomy. The most proximal aspect of the appendix appears to remain with no evidence of appendicitis. There are a few diverticuli in the region of the cecum. Vascular/Lymphatic: Calcified atherosclerotic changes identified in the nonaneurysmal aorta. No adenopathy identified. Reproductive: Prostate is unremarkable. Other: No free air. There is a small amount of free fluid in the abdomen tracking into the pelvis. The previously identified periumbilical hernia has been repaired. There is a ventral hernia in the right anterolateral upper pelvic region. Fluid tracks into this hernia. The hernia is otherwise fat containing. Musculoskeletal: Degenerative changes in the hips, left greater than right. IMPRESSION: 1. There is a small bowel obstruction with a  transition point in the mid ileum. Distal to the transition point, the small bowel is relatively decompressed with associated regions of wall thickening and mucosal enhancement. The wall thickening and mucosal enhancement are consistent with a Crohn's flare. 2. Mucosal enhancement in the distal sigmoid colon and rectum may also be associated with the Crohn's flare. 3. Fluid in the rectum and distal sigmoid colon consistent with diarrhea. 4. Mild reactive fluid in the abdomen and pelvis as above. 5. There are few colonic diverticuli in the region of the cecum. No diverticulitis. 6. Hepatic steatosis. 7. Calcified atherosclerotic changes in the nonaneurysmal aorta. 8. There is a ventral hernia in the right  anterolateral upper pelvic region. Electronically Signed   By: Dorise Bullion III M.D.   On: 06/10/2021 10:14    EKG: not done   Labs on Admission: I have personally reviewed the available labs and imaging studies at the time of the admission.  Pertinent labs:    Glucose 121 WBC 3.3 UA: 30 protein   Assessment and Plan: Principal Problem:   Crohn's colitis, with intestinal obstruction (HCC) Active Problems:   OSA on CPAP   Essential hypertension   DNR (do not resuscitate)    Crohn's flare, possible SBO -Patient with known h/o Crohn's presenting with abdominal pain and n/v/d -Imaging with Crohn's flare as well as possible SBO -Will admit to MedSurg  -Will consult GI to assist with management  -Morphine for pain control, Zofran for nausea when necessary  -Will get stool studies, including stool C. Diff and GI pathogen panel. -Empirically treat for Crohn's flare with intravenous steroids (dosing per GI) -Check fecal calprotectin, c-reactive protein -If symptoms recur/worsen, he likely needs NG tube  HTN -Continue amlodipine, metoprolol  HLD  -He does not appear to be taking medications for this issue at this time   OSA -Continue CPAP  DNR -I have discussed code status with the patient and he would not desire resuscitation and would prefer to die a natural death should that situation arise. -He will need a gold out of facility DNR form at the time of discharge     Advance Care Planning:   Code Status: DNR   Consults: GI  DVT Prophylaxis: SCDs  Family Communication: None present; he declined to have me call family at the time of admission  Severity of Illness: The appropriate patient status for this patient is INPATIENT. Inpatient status is judged to be reasonable and necessary in order to provide the required intensity of service to ensure the patient's safety. The patient's presenting symptoms, physical exam findings, and initial radiographic and laboratory  data in the context of their chronic comorbidities is felt to place them at high risk for further clinical deterioration. Furthermore, it is not anticipated that the patient will be medically stable for discharge from the hospital within 2 midnights of admission.   * I certify that at the point of admission it is my clinical judgment that the patient will require inpatient hospital care spanning beyond 2 midnights from the point of admission due to high intensity of service, high risk for further deterioration and high frequency of surveillance required.*  Author: Karmen Bongo, MD 06/10/2021 2:45 PM  For on call review [email or web address].

## 2021-06-10 NOTE — Consult Note (Signed)
Referring Provider: Carlisle Cater  Primary Care Physician:  Rubie Maid, MD Primary Gastroenterologist:  Althia Forts. Dr. Aurelio Brash in 2015.   Reason for Consultation: Crohn's disease, abdominal pain, SBO  HPI: Maxwell Li is a 66 y.o. male with a past medical history of hypertension, hyperlipidemia, complete heart block s/p pacemaker, leukopenia, asthma obstructive sleep apnea on CPAP GERD, hyperplastic rectal polyps and Crohn's disease. S/P appendectomy in the 1960s.  He ate a packet of peanuts yesterday and 1 hour later he developed pain throughout his upper abdomen which was sharp then localized to the RLQ area with noticeable swelling.  He vomited nonbloody yellow emesis x 2.  He had difficulty sleeping throughout the night due to worsening abdominal pain.  He passed 2 episodes of small-volume diarrhea earlier this morning.  He presented to the ED earlier today for further evaluation.  Labs in the ED showed a WBC count of 3.3.  Hemoglobin 14.6.  Hematocrit 43.6.  Platelet 129.  Sodium 135.  Potassium 3.9.  BUN 14.  Creatinine 0.84.  Total bili 1.0.  Alk phos 59.  AST 18.  ALT 12.  Lipase 27.  CTAP showed a SBO with a transition point in the mid ileum, distal to the transition point the small bowel is decompressed with associated regions of wall thickening and mucosal enhancement consistent with a Crohn's flare.  Fluid in the rectum and distal sigmoid colon with associated mucosal enhancement suggestive active colitis.  A few diverticula to the cecum without evidence of diverticulitis.  Hepatic steatosis.  A ventral hernia in the right anterior lateral upper pelvic region.  No further vomiting or diarrhea since arriving to the ED.  He has a history of Crohn's disease which he reported was diagnosed in 2015 ago by Dr. Chip Boer in [facility].  He underwent an EGD and colonoscopy 07/27/2013.  The EGD identified several antral erosions likely due to ibuprofen use and an inlet patch in the  proximal esophagus.  Duodenal biopsies showed evidence of chronic active duodenitis without evidence of celiac disease.  Gastric biopsies showed chronic active gastritis without evidence of H. pylori.  Esophageal biopsies were suggestive of reflux esophagitis.  The colonoscopy identified grainy erosions with some ileal erythema, ileocecal valve erythema with erosions, transverse colon with erosions and 2 diminutive rectal polyps were removed.  Biopsies of the transverse colon showed benign mucosa with mild acute colitis, biopsies of the ileum showed benign ileocolonic mucosa showing patchy acute enterocolitis (these findings were nonspecific and were possibly due to NSAID use but Crohn's colitis could not be excluded),  the rectal polyps were hyperplastic.  He was advised to discontinue ibuprofen use at that time. He was initially prescribed Budesonide which she took for 1 month which was not affordable therefore he was switched to Sulfasalazine. He followed up with Dr. Chip Boer for 1 visit postprocedure then decided to have his PCP manage his Crohn's disease.   He is remains on Sulfasalazine since then, however, he reports taking Sulfasalazine 1 tab daily and not 6 tabs as prescribed.  He denies having any further colonoscopies as his initial colonoscopy which diagnosed his Crohn's disease.  He has chronic diarrhea.  He passes a brown like stool 3-4 times daily after eating.  No bloody stools or melena.  Infrequent heartburn.  No dysphagia.  He takes ibuprofen 200 mg 2 tabs daily for the past + years for left hip and knee pain.  He complains of having phlegm in his throat upon awakening in the  morning and sometimes he coughs to the point when he gags then vomits.  He notices a bulge to his RLQ area when he vomits has been ongoing for several years.  No fever, sweats or chills.  He has lost 15 pounds over the past 6 months.  No recent antibiotics.  He drinks 1 shot of vodka and coke once weekly.  Non-smoker. No  family history of IBD or colorectal cancer.  He has a history of leukopenia.  He reports seeing a hematologist in the past who thought he possibly had CLL.  He did not wish to pursue any further hematological evaluation or treatment.  PAST GI PROCEDURES:  EGD 07/27/2013:  1. Esophagus - The Z line was crisp and regular and corresponded to the GE junction confirmed by the most proximal extent of gastric folds at 40 cm. from the incisors. In the proximal esophagus just below the upper esophageal sphincter muscle, the patient had a several centimeter inlet patch of gastric appearing mucosa. Numerous biopsies were done in this area to rule out the possibility of a island of Barrett's esophagus. The esophageal exam was otherwise normal.   2. Stomach - The exam of the stomach included a retroflex view of the angularis, cardia and fundus. The patient had some erosions in the prepyloric area of the antrum. Multiple biopsies were done of the erosions and biopsies were also done randomly in the antrum and body of the stomach to rule out H. pylori. The stomach exam was otherwise normal. Gastric fold size, thickness and distensibility was normal.  3. Pylorus and duodenum - The exam of the pylorus and duodenum was normal. In light of diarrhea, random biopsies were done in the descending duodenum at the full length of scope insertion as well as in the proximal duodenum and duodenal bulb to rule out small bowel mucosal disease. The upper digestive tract was deflated and the scope removed.  The patient tolerated the procedure well.  IMPRESSION:  1. Several antral erosions probably due to Ibuprofen use.  2. Inlet patch in the proximal esophagus   Colonoscopy 07/27/2013: 1. I entered the ileocecal valve and advanced the scope approximately 10 cm into the distal terminal ileum. There were some erosions and erythema most notable just inside the ileocecal valve although there were some erosions as far as the scope  could be advanced. Numerous biopsies were done of the ileum of the erosions and areas of erythema as well as of normal appearing ileal mucosa. There were some erosions on the inlets of the ileocecal valve, which were biopsied as well. The outer areas of the ileocecal valve appeared erythematous and biopsies were done of these areas as well and submitted in the same bottle. The ileum examined was otherwise normal.   2. Colon - In the transverse colon, there was a 4 mm erosion which was biopsied multiple times. The colon exam was otherwise normal. Random biopsies were done in all parts of the colon and in the rectum to rule out microscopic evidence of inflammatory bowel disease, microscopic colitis or collagenous colitis.   3. Rectum - The exam of the rectum included a retroflex view of the rectum and anus. In the distal rectum, there were two diminutive 2 mm sessile polyps removed by cold biopsy. The patient had small to medium sized internal hemorrhoids. The rectal exam was otherwise normal. The colon was deflated and the scope removed.  The patient tolerated the procedure well.  IMPRESSION:  1. Grainy erosions with some ileal  erythema and ileocecal valve erythema and erosions.  2. Transverse colon erosion. 3. Two diminutive rectal polyps removed. 4. Internal hemorrhoids.    Past Medical History:  Diagnosis Date   Crohn's disease ([place])    DYSLIPIDEMIA 06/28/2009   Qualifier: Diagnosis of  By: Loanne Drilling MD, Sean A    GERD 09/04/2006   Qualifier: Diagnosis of  By: Loanne Drilling MD, Sean A    Hypertension    HYPOGONADISM, MALE 05/20/2008   Qualifier: Diagnosis of  By: Loanne Drilling MD, Sean A    KNEE PAIN 03/24/2007   Qualifier: Diagnosis of  By: Loanne Drilling MD, Sean A    Ventral hernia     Past Surgical History:  Procedure Laterality Date   APPENDECTOMY     ELECTROCARDIOGRAM  02/01/2006   EP IMPLANTABLE DEVICE N/A 06/09/2015   Procedure: Pacemaker Implant;  Surgeon: Will Meredith Leeds, MD;  Location:  [place] [facility];  Service: Cardiovascular;  Laterality: N/A;   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 Authorizing Provider  albuterol (PROVENTIL HFA;VENTOLIN HFA) 108 (90 Base) MCG/ACT inhaler Inhale 2 puffs into the lungs every 6 (six) hours as needed for wheezing or shortness of breath.    [provider]  DICYCLOMINE HCL PO Take 20 mg by mouth at bedtime as needed (stomach spasms).     [provider]  hydrocortisone cream 1 % Apply 1 application topically daily as needed (facial rash).    [provider]  ibuprofen (ADVIL,MOTRIN) 200 MG tablet Take 400 mg by mouth every 6 (six) hours as needed (knee pain). Reported on 06/22/2015    [provider]  metoprolol tartrate (LOPRESSOR) 25 MG tablet Take 1 tablet (25 mg total) by mouth 2 (two) times daily. 02/08/17 05/09/17  Baldwin Jamaica, PA-C  PRESCRIPTION MEDICATION Inhale into the lungs at bedtime. CPAP    [provider]  testosterone cypionate (DEPOTESTOTERONE CYPIONATE) 200 MG/ML injection Inject 1 mL (200 mg total) into the muscle every 14 (fourteen) days. 04/28/13   Renato Shin, MD    No current facility-administered medications for this encounter.   Current Outpatient Medications  Medication Sig Dispense Refill   albuterol (PROVENTIL HFA;VENTOLIN HFA) 108 (90 Base) MCG/ACT inhaler Inhale 2 puffs into the lungs every 6 (six) hours as needed for wheezing or shortness of breath.     DICYCLOMINE HCL PO Take 20 mg by mouth at bedtime as needed (stomach spasms).      hydrocortisone cream 1 % Apply 1 application topically daily as needed (facial rash).     ibuprofen (ADVIL,MOTRIN) 200 MG tablet Take 400 mg by mouth every 6 (six) hours as needed (knee pain). Reported on 06/22/2015     metoprolol tartrate (LOPRESSOR) 25 MG tablet Take 1 tablet (25 mg total) by mouth 2 (two) times daily.  180 tablet 3   PRESCRIPTION MEDICATION Inhale into the lungs at bedtime. CPAP     testosterone cypionate (DEPOTESTOTERONE CYPIONATE) 200 MG/ML injection Inject 1 mL (200 mg total) into the muscle every 14 (fourteen) days. 10 mL 0    Allergies as of 06/10/2021 - Review Complete 03/15/2017  Allergen Reaction Noted   Lidocaine Swelling 06/08/2015   Lovastatin     Shellfish allergy Itching 06/08/2015    Family History  Problem Relation Age of Onset   Cancer Brother        Lung Cancer  Social History   Socioeconomic History   Marital status: Divorced    Spouse name: Not on file   Number of children: Not on file   Years of education: Not on file   Highest education level: Not on file  Occupational History   Occupation: Pastor  Tobacco Use   Smoking status: Former   Smokeless tobacco: Never  Scientific laboratory technician Use: Never used  Substance and Sexual Activity   Alcohol use: No   Drug use: No   Sexual activity: Not on file  Other Topics Concern   Not on file  Social History Narrative   Not on file   Social Determinants of Health   Financial Resource Strain: Not on file  Food Insecurity: Not on file  Transportation Needs: Not on file  Physical Activity: Not on file  Stress: Not on file  Social Connections: Not on file  Intimate Partner Violence: Not on file    Review of Systems: Gen: + 15lb weight loss.  No fever, sweats or chills.   CV: Denies chest pain, palpitations or edema. Resp: Denies cough, shortness of breath of hemoptysis.  GI: Denies heartburn, dysphagia, stomach or lower abdominal pain. No diarrhea or constipation. No rectal bleeding or melena.   GU : Denies urinary burning, blood in urine, increased urinary frequency or incontinence. MS: + Left hip and knee pain.   Derm: Denies rash, itchiness, skin lesions or unhealing ulcers. Psych: Denies depression, anxiety or memory loss. Heme: Denies easy bruising, bleeding. Neuro:  Denies headaches, dizziness  or paresthesias. Endo:  Denies any problems with DM, thyroid or adrenal function.  Physical Exam: Vital signs in last 24 hours: Temp:  [97.5 F (36.4 C)-97.9 F (36.6 C)] 97.5 F (36.4 C) (06/03 0820) Pulse Rate:  [68-97] 71 (06/03 1100) Resp:  [10-22] 19 (06/03 1100) BP: (93-149)/(74-138) 127/87 (06/03 1100) SpO2:  [91 %-95 %] 95 % (06/03 1100)   General: Alert 66 year old male in no acute distress. Head:  Normocephalic and atraumatic. Eyes:  No scleral icterus. Conjunctiva pink. Ears:  Normal auditory acuity. Nose:  No deformity, discharge or lesions. Mouth:  Dentition intact. No ulcers or lesions.  Neck:  Supple. No lymphadenopathy or thyromegaly.  Lungs: Breath sounds coarse with expiration throughout. Heart: Regular rate and rhythm, no murmurs. Abdomen: Soft, nondistended.  Mild tenderness to the RLQ area without rebound or guarding.  Hypoactive bowel sounds to all 4 quadrants.  No palpable mass. Rectal: Deferred. Musculoskeletal:  Symmetrical without gross deformities.  Pulses:  Normal pulses noted. Extremities:  Without clubbing or edema. Neurologic:  Alert and  oriented x 4. No focal deficits.  Skin:  Intact without significant lesions or rashes. Psych:  Alert and cooperative. Normal mood and affect.  Intake/Output from previous day: No intake/output data recorded. Intake/Output this shift: No intake/output data recorded.  Lab Results: Recent Labs    06/10/21 0808  WBC 3.3*  HGB 14.6  HCT 43.6  PLT 129*   BMET Recent Labs    06/10/21 0808  NA 135  K 3.9  CL 93*  CO2 32  GLUCOSE 121*  BUN 14  CREATININE 0.84  CALCIUM 9.1   LFT Recent Labs    06/10/21 0808  PROT 7.4  ALBUMIN 3.7  AST 18  ALT 12  ALKPHOS 59  BILITOT 1.0   PT/INR No results for input(s): LABPROT, INR in the last 72 hours. Hepatitis Panel No results for input(s): HEPBSAG, HCVAB, HEPAIGM, HEPBIGM in the last 72 hours.  Studies/Results: CT ABDOMEN PELVIS W  CONTRAST  Result Date: 06/10/2021 CLINICAL DATA:  Right lower quadrant pain. History of appendectomy. History of Crohn's disease. Vomiting and diarrhea. EXAM: CT ABDOMEN AND PELVIS WITH CONTRAST TECHNIQUE: Multidetector CT imaging of the abdomen and pelvis was performed using the standard protocol following bolus administration of intravenous contrast. RADIATION DOSE REDUCTION: This exam was performed according to the departmental dose-optimization program which includes automated exposure control, adjustment of the mA and/or kV according to patient size and/or use of iterative reconstruction technique. CONTRAST:  151m OMNIPAQUE IOHEXOL 300 MG/ML  SOLN COMPARISON:  March 24, 2007 FINDINGS: Lower chest: No acute abnormality. Hepatobiliary: Multiple tiny low-attenuation lesions in the liver too small to characterize but consistent with small cysts or hemangiomas. Mild hepatic steatosis. The gallbladder is normal. The portal vein is patent. Pancreas: Unremarkable. No pancreatic ductal dilatation or surrounding inflammatory changes. Spleen: Normal in size without focal abnormality. Adrenals/Urinary Tract: Adrenal glands are unremarkable. Kidneys are normal, without renal calculi, focal lesion, or hydronephrosis. Bladder is unremarkable. Stomach/Bowel: The stomach is normal. There is a small bowel obstruction with dilated loops of distal jejunum and proximal ileum. The transition point is in the ileum best seen on sagittal image 7 and axial image 34. The ileum distal to the transition point demonstrates regions of wall thickening and mucosal enhancement. A representative image demonstrating mucosal enhancement is coronal image 74. Most of the colon is normal in caliber to mildly decompressed. There is fluid in the distal sigmoid colon and rectum. There is mucosal enhancement in the region of the rectum and distal sigmoid colon. By report, the patient is status post appendectomy. The most proximal aspect of the appendix  appears to remain with no evidence of appendicitis. There are a few diverticuli in the region of the cecum. Vascular/Lymphatic: Calcified atherosclerotic changes identified in the nonaneurysmal aorta. No adenopathy identified. Reproductive: Prostate is unremarkable. Other: No free air. There is a small amount of free fluid in the abdomen tracking into the pelvis. The previously identified periumbilical hernia has been repaired. There is a ventral hernia in the right anterolateral upper pelvic region. Fluid tracks into this hernia. The hernia is otherwise fat containing. Musculoskeletal: Degenerative changes in the hips, left greater than right. IMPRESSION: 1. There is a small bowel obstruction with a transition point in the mid ileum. Distal to the transition point, the small bowel is relatively decompressed with associated regions of wall thickening and mucosal enhancement. The wall thickening and mucosal enhancement are consistent with a Crohn's flare. 2. Mucosal enhancement in the distal sigmoid colon and rectum may also be associated with the Crohn's flare. 3. Fluid in the rectum and distal sigmoid colon consistent with diarrhea. 4. Mild reactive fluid in the abdomen and pelvis as above. 5. There are few colonic diverticuli in the region of the cecum. No diverticulitis. 6. Hepatic steatosis. 7. Calcified atherosclerotic changes in the nonaneurysmal aorta. 8. There is a ventral hernia in the right anterolateral upper pelvic region. Electronically Signed   By: DDorise BullionIII M.D.   On: 06/10/2021 10:14    IMPRESSION/PLAN:  155 66year old male with a questionable history of Crohn's disease, chronic diarrhea with N/V, upper and RLQ pain. CTAP identified a SBO with a transition point in the mid ileum, distal to the transition point the small bowel is decompressed with associated regions of wall thickening and mucosal enhancement consistent with a Crohn's flare.  Fluid in the rectum and distal sigmoid colon  with associated mucosal enhancement  suggestive active colitis and a ventral hernia in the right anterior lateral upper pelvic region. -NPO -NG tube if he develops abdominal distension or recurrent vomiting  -General surgery consult if he develops acute abdominal pain/vomiting/abdominal distention -IV Soluedrol 20 mg IV twice daily -Zofran 4 mg IV every 6 to 8 hours as needed -IVF per the hospitalist  -No plans for any endoscopic evaluation at this time -No NSAID use -Await further recommendations per Dr. Hilarie Fredrickson  2) GERD -Pantoprazole 40 mg IV Q 24hrs   3) Hepatic steatosis per CT. Normal LFTs.   Patrecia Pour Kennedy-Smith  06/10/2021, 11:28 AM

## 2021-06-10 NOTE — Plan of Care (Signed)
Patient settled in room, admission assessment completed. Will continue to monitor as per orders and plan of care.

## 2021-06-10 NOTE — ED Notes (Signed)
Patient transported to CT 

## 2021-06-10 NOTE — ED Triage Notes (Signed)
C/o generalized abd pain with nausea, vomiting, and diarrhea since yesterday.  States he was diagnosed with crohn's in 2016.  Reports a "bulge" in RLQ.  States diarrhea looks like "bile".

## 2021-06-10 NOTE — Progress Notes (Signed)
Pt placed on CPAP and resting comfortably. RT will cont to monitor .

## 2021-06-10 NOTE — ED Provider Notes (Signed)
[facility] EMERGENCY DEPARTMENT Provider Note   CSN: [id number] Arrival date & time: 06/10/21  4742     History  Chief Complaint  Patient presents with   Abdominal Pain    Maxwell Li is a 66 y.o. male.  Patient with history of Crohn's colitis presents with abdominal pain and "swelling" for 1 day. The patient had worsening abdominal pain which started yesterday afternoon after eating peanuts. This morning, the patient had three episodes of yellow/green diarrhea and one episode of vomiting.  No associated fevers, chest pain, shortness of breath, dysuria or hematuria.  He later felt a painful lump in his RLQ.  This is improved with lying flat and exacerbated by standing.  Pertinent medical history includes Crohn's treated with sulfasalazine; pt has 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 Surgical history includes appendectomy in the 5956'L and umbilical hernia diagnosed on imaging in 2009.       Home Medications Prior to Admission medications   Medication Sig Start Date End Date Taking? Authorizing Provider  albuterol (PROVENTIL HFA;VENTOLIN HFA) 108 (90 Base) MCG/ACT inhaler Inhale 2 puffs into the lungs every 6 (six) hours as needed for wheezing or shortness of breath.    [provider]  DICYCLOMINE HCL PO Take 20 mg by mouth at bedtime as needed (stomach spasms).     [provider]  hydrocortisone cream 1 % Apply 1 application topically daily as needed (facial rash).    [provider]  ibuprofen (ADVIL,MOTRIN) 200 MG tablet Take 400 mg by mouth every 6 (six) hours as needed (knee pain). Reported on 06/22/2015    [provider]  metoprolol tartrate (LOPRESSOR) 25 MG tablet Take 1 tablet (25 mg total) by mouth 2 (two) times daily. 02/08/17 05/09/17  Baldwin Jamaica, PA-C  PRESCRIPTION MEDICATION Inhale into the lungs at bedtime. CPAP    [provider]  testosterone cypionate (DEPOTESTOTERONE  CYPIONATE) 200 MG/ML injection Inject 1 mL (200 mg total) into the muscle every 14 (fourteen) days. 04/28/13   Renato Shin, MD      Allergies    Lidocaine, Lovastatin, and Shellfish allergy    Review of Systems   Review of Systems  Physical Exam Updated Vital Signs BP 105/74   Pulse 72   Temp (!) 97.5 F (36.4 C) (Oral)   Resp 18   SpO2 91%   Physical Exam Vitals and nursing note reviewed.  Constitutional:      General: He is not in acute distress.    Appearance: He is well-developed.  HENT:     Head: Normocephalic and atraumatic.  Eyes:     General:        Right eye: No discharge.        Left eye: No discharge.     Conjunctiva/sclera: Conjunctivae normal.  Cardiovascular:     Rate and Rhythm: Normal rate and regular rhythm.     Heart sounds: Normal heart sounds.  Pulmonary:     Effort: Pulmonary effort is normal.     Breath sounds: Normal breath sounds.  Abdominal:     Palpations: Abdomen is soft.     Tenderness: There is generalized abdominal tenderness and tenderness in the right lower quadrant. There is no guarding or rebound. Negative signs include Murphy's sign and McBurney's sign.     Comments: I can palpate what feels like scar tissue in the soft tissue of the right lower quadrant.  No definite hernias palpated.  Very locally, when  I push in specific areas, patient winces and yells in pain.  Musculoskeletal:     Cervical back: Normal range of motion and neck supple.  Skin:    General: Skin is warm and dry.  Neurological:     Mental Status: He is alert.    ED Results / Procedures / Treatments   Labs (all labs ordered are listed, but only abnormal results are displayed) Labs Reviewed  COMPREHENSIVE METABOLIC PANEL - Abnormal; Notable for the following components:      Result Value   Chloride 93 (*)    Glucose, Bld 121 (*)    All other components within normal limits  CBC - Abnormal; Notable for the following components:   WBC 3.3 (*)    Platelets 129  (*)    All other components within normal limits  URINALYSIS, ROUTINE W REFLEX MICROSCOPIC - Abnormal; Notable for the following components:   Color, Urine AMBER (*)    APPearance HAZY (*)    Protein, ur 30 (*)    All other components within normal limits  LIPASE, BLOOD    EKG None  Radiology CT ABDOMEN PELVIS W CONTRAST  Result Date: 06/10/2021 CLINICAL DATA:  Right lower quadrant pain. History of appendectomy. History of Crohn's disease. Vomiting and diarrhea. EXAM: CT ABDOMEN AND PELVIS WITH CONTRAST TECHNIQUE: Multidetector CT imaging of the abdomen and pelvis was performed using the standard protocol following bolus administration of intravenous contrast. RADIATION DOSE REDUCTION: This exam was performed according to the departmental dose-optimization program which includes automated exposure control, adjustment of the mA and/or kV according to patient size and/or use of iterative reconstruction technique. CONTRAST:  153m OMNIPAQUE IOHEXOL 300 MG/ML  SOLN COMPARISON:  March 24, 2007 FINDINGS: Lower chest: No acute abnormality. Hepatobiliary: Multiple tiny low-attenuation lesions in the liver too small to characterize but consistent with small cysts or hemangiomas. Mild hepatic steatosis. The gallbladder is normal. The portal vein is patent. Pancreas: Unremarkable. No pancreatic ductal dilatation or surrounding inflammatory changes. Spleen: Normal in size without focal abnormality. Adrenals/Urinary Tract: Adrenal glands are unremarkable. Kidneys are normal, without renal calculi, focal lesion, or hydronephrosis. Bladder is unremarkable. Stomach/Bowel: The stomach is normal. There is a small bowel obstruction with dilated loops of distal jejunum and proximal ileum. The transition point is in the ileum best seen on sagittal image 7 and axial image 34. The ileum distal to the transition point demonstrates regions of wall thickening and mucosal enhancement. A representative image demonstrating  mucosal enhancement is coronal image 74. Most of the colon is normal in caliber to mildly decompressed. There is fluid in the distal sigmoid colon and rectum. There is mucosal enhancement in the region of the rectum and distal sigmoid colon. By report, the patient is status post appendectomy. The most proximal aspect of the appendix appears to remain with no evidence of appendicitis. There are a few diverticuli in the region of the cecum. Vascular/Lymphatic: Calcified atherosclerotic changes identified in the nonaneurysmal aorta. No adenopathy identified. Reproductive: Prostate is unremarkable. Other: No free air. There is a small amount of free fluid in the abdomen tracking into the pelvis. The previously identified periumbilical hernia has been repaired. There is a ventral hernia in the right anterolateral upper pelvic region. Fluid tracks into this hernia. The hernia is otherwise fat containing. Musculoskeletal: Degenerative changes in the hips, left greater than right. IMPRESSION: 1. There is a small bowel obstruction with a transition point in the mid ileum. Distal to the transition point, the small bowel  is relatively decompressed with associated regions of wall thickening and mucosal enhancement. The wall thickening and mucosal enhancement are consistent with a Crohn's flare. 2. Mucosal enhancement in the distal sigmoid colon and rectum may also be associated with the Crohn's flare. 3. Fluid in the rectum and distal sigmoid colon consistent with diarrhea. 4. Mild reactive fluid in the abdomen and pelvis as above. 5. There are few colonic diverticuli in the region of the cecum. No diverticulitis. 6. Hepatic steatosis. 7. Calcified atherosclerotic changes in the nonaneurysmal aorta. 8. There is a ventral hernia in the right anterolateral upper pelvic region. Electronically Signed   By: Dorise Bullion III M.D.   On: 06/10/2021 10:14    Procedures Procedures    Medications Ordered in ED Medications   iohexol (OMNIPAQUE) 300 MG/ML solution 100 mL (100 mLs Intravenous Contrast Given 06/10/21 0948)    ED Course/ Medical Decision Making/ A&P    Patient seen and examined. History obtained directly from patient as well as previous cardiology clinic notes and remote CT that I reviewed and shows fat containing umbilical hernia. Work-up including labs, imaging, EKG ordered in triage, if performed, were reviewed.    Labs/EKG: Independently reviewed and interpreted.  This included: CBC with mild leukocytosis at 3.3, mild thrombocytopenia at 129; CMP with normal electrolytes save for slightly low chloride at 93, normal LFTs, glucose slightly elevated at 121, normal creatinine and BUN; lipase 27 normal.  Imaging: CT scan of the abdomen pelvis ordered  Medications/Fluids: Ordered: None ordered considered administration of: Parenteral pain and nausea medications, however patient is currently comfortable.  Most recent vital signs reviewed and are as follows: BP 112/79   Pulse 69   Temp (!) 97.5 F (36.4 C) (Oral)   Resp 17   SpO2 93%   Initial impression: Right lower quadrant abdominal pain  10:51 AM Reassessment performed. Patient appears stable, continuing to have waves of pain.  Labs personally reviewed and interpreted including: UA without signs of infection  Imaging personally visualized and interpreted including: CT of the abdomen pelvis, demonstrates likely Crohn's disease exacerbation of the ileum and sigmoid colon with associated small bowel obstruction proximal to the ileum, also some fluid noted in the abdomen likely reactive.  Reviewed pertinent lab work and imaging with patient at bedside. Questions answered.   Most current vital signs reviewed and are as follows: BP 114/79   Pulse 68   Temp (!) 97.5 F (36.4 C) (Oral)   Resp 15   SpO2 93%   Plan: GI consultation, likely admission for treatment of Crohn's disease and small bowel obstruction  11:28 AM I have consulted with  [place] gastro[facility], Carl Best NP.  They will plan to see the patient in consult.  I have consulted with Dr. Lorin Mercy, [facility] hospitalist, regarding admission for the patient.  She will see.                          Medical Decision Making Amount and/or Complexity of Data Reviewed Labs: ordered. Radiology: ordered.  Risk Prescription drug management.   For this patient's complaint of abdominal pain, the following conditions were considered on the differential diagnosis: crohn's disease exacerbation, small/large bowel obstruction, gastritis/PUD, enteritis/duodenitis, appendicitis, cholelithiasis/cholecystitis, cholangitis, pancreatitis, ruptured viscus, colitis, diverticulitis, proctitis, cystitis, pyelonephritis, ureteral colic, aortic dissection, aortic aneurysm. In women, ectopic pregnancy, pelvic inflammatory disease, ovarian cysts, and tubo-ovarian abscess were also considered. Atypical chest etiologies were also considered including ACS, PE, and pneumonia.   \  Final Clinical Impression(s) / ED Diagnoses Final diagnoses:  Small bowel obstruction ([place])  Crohn's disease of both small and large intestine with complication [facility])    Rx / DC Orders ED Discharge Orders     None         Carlisle Cater, PA-C 06/10/21 1131    Valarie Merino, MD 06/13/21 1054

## 2021-06-10 NOTE — Progress Notes (Signed)
Patient arrived to unit, walked from stretcher to hospital bed. Patient alert and oriented x 4. Vitals taken. IV fluids continued. Patient sitting edge of bed eating dinner. Call light and phone within reach. Patient instructed to call for staff before getting out of bed.

## 2021-06-11 ENCOUNTER — Inpatient Hospital Stay (HOSPITAL_COMMUNITY): Payer: Medicare HMO

## 2021-06-11 DIAGNOSIS — D72819 Decreased white blood cell count, unspecified: Secondary | ICD-10-CM | POA: Diagnosis not present

## 2021-06-11 DIAGNOSIS — K50112 Crohn's disease of large intestine with intestinal obstruction: Secondary | ICD-10-CM | POA: Diagnosis not present

## 2021-06-11 LAB — BASIC METABOLIC PANEL
Anion gap: 7 (ref 5–15)
BUN: 10 mg/dL (ref 8–23)
CO2: 27 mmol/L (ref 22–32)
Calcium: 8.4 mg/dL — ABNORMAL LOW (ref 8.9–10.3)
Chloride: 96 mmol/L — ABNORMAL LOW (ref 98–111)
Creatinine, Ser: 0.56 mg/dL — ABNORMAL LOW (ref 0.61–1.24)
GFR, Estimated: >60 mL/min (ref >60)
Glucose, Bld: 126 mg/dL — ABNORMAL HIGH (ref 70–99)
Potassium: 4.3 mmol/L (ref 3.5–5.1)
Sodium: 130 mmol/L — ABNORMAL LOW (ref 135–145)

## 2021-06-11 LAB — CBC WITH DIFFERENTIAL/PLATELET
Abs Immature Granulocytes: 0.03 10*3/uL (ref 0.00–0.07)
Basophils Absolute: 0 10*3/uL (ref 0.0–0.1)
Basophils Relative: 0 %
Eosinophils Absolute: 0 10*3/uL (ref 0.0–0.5)
Eosinophils Relative: 1 %
HCT: 34.8 % — ABNORMAL LOW (ref 39.0–52.0)
Hemoglobin: 11.9 g/dL — ABNORMAL LOW (ref 13.0–17.0)
Immature Granulocytes: 2 %
Lymphocytes Relative: 40 %
Lymphs Abs: 0.6 10*3/uL — ABNORMAL LOW (ref 0.7–4.0)
MCH: 31.2 pg (ref 26.0–34.0)
MCHC: 34.2 g/dL (ref 30.0–36.0)
MCV: 91.1 fL (ref 80.0–100.0)
Monocytes Absolute: 0.1 10*3/uL (ref 0.1–1.0)
Monocytes Relative: 4 %
Neutro Abs: 0.8 10*3/uL — ABNORMAL LOW (ref 1.7–7.7)
Neutrophils Relative %: 53 %
Platelets: 107 10*3/uL — ABNORMAL LOW (ref 150–400)
RBC: 3.82 MIL/uL — ABNORMAL LOW (ref 4.22–5.81)
RDW: 14.2 % (ref 11.5–15.5)
WBC: 1.4 10*3/uL — CL (ref 4.0–10.5)
nRBC: 0 % (ref 0.0–0.2)

## 2021-06-11 LAB — VITAMIN B12: Vitamin B-12: 253 pg/mL (ref 180–914)

## 2021-06-11 LAB — TECHNOLOGIST SMEAR REVIEW

## 2021-06-11 LAB — HEPATITIS B SURFACE ANTIGEN: Hepatitis B Surface Ag: NONREACTIVE

## 2021-06-11 LAB — CLOSTRIDIUM DIFFICILE BY PCR, REFLEXED: Toxigenic C. Difficile by PCR: NEGATIVE

## 2021-06-11 LAB — HEPATITIS B CORE ANTIBODY, TOTAL: Hep B Core Total Ab: NONREACTIVE

## 2021-06-11 LAB — C DIFFICILE QUICK SCREEN W PCR REFLEX
C Diff antigen: POSITIVE — AB
C Diff toxin: NEGATIVE

## 2021-06-11 LAB — HEPATITIS C ANTIBODY: HCV Ab: NONREACTIVE

## 2021-06-11 LAB — HEPATITIS B SURFACE ANTIBODY,QUALITATIVE: Hep B S Ab: NONREACTIVE

## 2021-06-11 MED ORDER — HEPARIN SODIUM (PORCINE) 5000 UNIT/ML IJ SOLN
5000.0000 [IU] | Freq: Three times a day (TID) | INTRAMUSCULAR | Status: DC
  Administered 2021-06-11 – 2021-06-13 (×6): 5000 [IU] via SUBCUTANEOUS
  Filled 2021-06-11 (×5): qty 1

## 2021-06-11 MED ORDER — TBO-FILGRASTIM 300 MCG/0.5ML ~~LOC~~ SOSY
300.0000 ug | PREFILLED_SYRINGE | Freq: Every day | SUBCUTANEOUS | Status: DC
  Administered 2021-06-11: 300 ug via SUBCUTANEOUS
  Filled 2021-06-11 (×2): qty 0.5

## 2021-06-11 MED ORDER — METHYLPREDNISOLONE SODIUM SUCC 40 MG IJ SOLR
40.0000 mg | INTRAMUSCULAR | Status: DC
  Administered 2021-06-11 – 2021-06-12 (×2): 40 mg via INTRAVENOUS
  Filled 2021-06-11 (×2): qty 1

## 2021-06-11 MED ORDER — LORATADINE 10 MG PO TABS
10.0000 mg | ORAL_TABLET | Freq: Every day | ORAL | Status: DC
  Administered 2021-06-11 – 2021-06-13 (×3): 10 mg via ORAL
  Filled 2021-06-11 (×3): qty 1

## 2021-06-11 MED ORDER — PANTOPRAZOLE SODIUM 40 MG IV SOLR
40.0000 mg | INTRAVENOUS | Status: DC
  Administered 2021-06-11 – 2021-06-13 (×3): 40 mg via INTRAVENOUS
  Filled 2021-06-11 (×3): qty 10

## 2021-06-11 NOTE — Evaluation (Addendum)
Physical Therapy Evaluation and Discharge Patient Details Name: Maxwell Li MRN: [id number] DOB: 1955/02/24 Today's Date: 06/11/2021  History of Present Illness  66 year old with history of Crohn's disease, dyslipidemia, hypertension and possible early diagnosis of CLL at [facility] 2 years ago who had no medical follow-up for several years due to poor insurance, comes in with abdominal pain in the right lower quadrant diagnosed with small bowel obstruction likely due to Crohn's flare and admitted to the hospital  Clinical Impression   Patient evaluated by Physical Therapy with no further acute PT needs identified. All education has been completed and the patient has no further questions. Pt is managing well; recommend he walk in the hallways a few times a day to keep strength and activity tolerance up; Educated pt to wash hands on the way out and on the way back into his room, and to wear a mask outside of his room;  See below for any follow-up Physical Therapy or equipment needs. PT is signing off. Thank you for this referral.        Recommendations for follow up therapy are one component of a multi-disciplinary discharge planning process, led by the attending physician.  Recommendations may be updated based on patient status, additional functional criteria and insurance authorization.  Follow Up Recommendations No PT follow up    Assistance Recommended at Discharge PRN  Patient can return home with the following       Equipment Recommendations None recommended by PT  Recommendations for Other Services       Functional Status Assessment Patient has not had a recent decline in their functional status     Precautions / Restrictions Precautions Precautions: None      Mobility  Bed Mobility Overal bed mobility: Independent                  Transfers Overall transfer level: Independent                      Ambulation/Gait Ambulation/Gait assistance:  Independent Gait Distance (Feet): 200 Feet Assistive device: IV Pole Gait Pattern/deviations: Step-through pattern       General Gait Details: No overt deviations or difficulty  Stairs            Wheelchair Mobility    Modified Rankin (Stroke Patients Only)       Balance Overall balance assessment: No apparent balance deficits (not formally assessed)                                           Pertinent Vitals/Pain Pain Assessment Pain Assessment: Faces Faces Pain Scale: Hurts a little bit Pain Location: R lower abdomen Pain Descriptors / Indicators: Aching Pain Intervention(s): Monitored during session    Home Living Family/patient expects to be discharged to:: Private residence Living Arrangements: Alone Available Help at Discharge: Family Type of Home: House Home Access: Stairs to enter   Technical brewer of Steps: 2   Home Layout: One level   Additional Comments: No reports of difficulty managing stairs at home or in teh bathroom\    Prior Function Prior Level of Function : Independent/Modified Independent                     Hand Dominance        Extremity/Trunk Assessment   Upper Extremity Assessment Upper Extremity Assessment: Overall  WFL for tasks assessed    Lower Extremity Assessment Lower Extremity Assessment: Overall WFL for tasks assessed       Communication   Communication: No difficulties  Cognition Arousal/Alertness: Awake/alert Behavior During Therapy: WFL for tasks assessed/performed Overall Cognitive Status: Within Functional Limits for tasks assessed                                          General Comments General comments (skin integrity, edema, etc.): Discussed hand washing and using a mask when pt walks in the hallway    Exercises     Assessment/Plan    PT Assessment Patient does not need any further PT services  PT Problem List         PT Treatment  Interventions      PT Goals (Current goals can be found in the Care Plan section)  Acute Rehab PT Goals Patient Stated Goal: Get better and get home PT Goal Formulation: All assessment and education complete, DC therapy    Frequency       Co-evaluation               AM-PAC PT "6 Clicks" Mobility  Outcome Measure Help needed turning from your back to your side while in a flat bed without using bedrails?: None Help needed moving from lying on your back to sitting on the side of a flat bed without using bedrails?: None Help needed moving to and from a bed to a chair (including a wheelchair)?: None Help needed standing up from a chair using your arms (e.g., wheelchair or bedside chair)?: None Help needed to walk in hospital room?: None Help needed climbing 3-5 steps with a railing? : None 6 Click Score: 24    End of Session   Activity Tolerance: Patient tolerated treatment well Patient left: Other (comment) (managing independently in teh room)   PT Visit Diagnosis: Muscle weakness (generalized) (M62.81)    Time: 1444-1500 PT Time Calculation (min) (ACUTE ONLY): 16 min   Charges:   PT Evaluation $PT Eval Low Complexity: [place], PT  Acute Rehabilitation Services Office [phone]   Colletta Maryland 06/11/2021, 3:24 PM

## 2021-06-11 NOTE — Progress Notes (Signed)
[facility] Progress Note  CC:  Crohn's disease, abdominal pain, SBO  Subjective: No nausea or vomiting.  He continues to have mild RLQ tenderness without significant abdominal pain.  He passed to yellow watery diarrhea bowel movements yesterday evening, no further diarrhea this morning.  He is passing gas per the rectum.  No chest pain or shortness of breath.   Objective:  Vital signs in last 24 hours: Temp:  [97.5 F (36.4 C)-98.8 F (37.1 C)] 98.8 F (37.1 C) (06/04 0051) Pulse Rate:  [66-82] 72 (06/04 0051) Resp:  [10-22] 18 (06/04 0051) BP: (93-149)/(68-138) 112/72 (06/04 0051) SpO2:  [91 %-95 %] 93 % (06/04 0051)   General: 66 year old male alert and oriented in no acute distress. Heart: Distant S1, S2, no murmurs.  Pacemaker palpated to the left subclavian chest wall. Pulm: Breath sounds clear but diminished throughout.  No wheezes or crackles. Abdomen: Soft, mild abdominal distention, abdomen is not tense.  Mild tenderness to the RLQ area without rebound or guarding.  Scar to the RLQ area noted.  Hypoactive bowel sounds to all 4 quadrants with intermittent higher pitch sounds. Extremities:  Without edema. Neurologic:  Alert and  oriented x 4. Grossly normal neurologically. Psych:  Alert and cooperative. Normal mood and affect.  Intake/Output from previous day: 06/03 0701 - 06/04 0700 In: 113.5 [I.V.:113.5] Out: -  Intake/Output this shift: No intake/output data recorded.  Lab Results: Recent Labs    06/10/21 0808 06/11/21 0130  WBC 3.3* 1.4*  HGB 14.6 11.9*  HCT 43.6 34.8*  PLT 129* 107*   BMET Recent Labs    06/10/21 0808 06/11/21 0130  NA 135 130*  K 3.9 4.3  CL 93* 96*  CO2 32 27  GLUCOSE 121* 126*  BUN 14 10  CREATININE 0.84 0.56*  CALCIUM 9.1 8.4*   LFT Recent Labs    06/10/21 0808  PROT 7.4  ALBUMIN 3.7  AST 18  ALT 12  ALKPHOS 59  BILITOT 1.0   PT/INR No results for input(s): LABPROT, INR in the last 72  hours. Hepatitis Panel Recent Labs    06/11/21 0130  HEPBSAG NON REACTIVE  HCVAB NON REACTIVE    CT ABDOMEN PELVIS W CONTRAST  Result Date: 06/10/2021 CLINICAL DATA:  Right lower quadrant pain. History of appendectomy. History of Crohn's disease. Vomiting and diarrhea. EXAM: CT ABDOMEN AND PELVIS WITH CONTRAST TECHNIQUE: Multidetector CT imaging of the abdomen and pelvis was performed using the standard protocol following bolus administration of intravenous contrast. RADIATION DOSE REDUCTION: This exam was performed according to the departmental dose-optimization program which includes automated exposure control, adjustment of the mA and/or kV according to patient size and/or use of iterative reconstruction technique. CONTRAST:  136m OMNIPAQUE IOHEXOL 300 MG/ML  SOLN COMPARISON:  March 24, 2007 FINDINGS: Lower chest: No acute abnormality. Hepatobiliary: Multiple tiny low-attenuation lesions in the liver too small to characterize but consistent with small cysts or hemangiomas. Mild hepatic steatosis. The gallbladder is normal. The portal vein is patent. Pancreas: Unremarkable. No pancreatic ductal dilatation or surrounding inflammatory changes. Spleen: Normal in size without focal abnormality. Adrenals/Urinary Tract: Adrenal glands are unremarkable. Kidneys are normal, without renal calculi, focal lesion, or hydronephrosis. Bladder is unremarkable. Stomach/Bowel: The stomach is normal. There is a small bowel obstruction with dilated loops of distal jejunum and proximal ileum. The transition point is in the ileum best seen on sagittal image 7 and axial image 34. The ileum distal to the transition point demonstrates regions of  wall thickening and mucosal enhancement. A representative image demonstrating mucosal enhancement is coronal image 74. Most of the colon is normal in caliber to mildly decompressed. There is fluid in the distal sigmoid colon and rectum. There is mucosal enhancement in the region of the  rectum and distal sigmoid colon. By report, the patient is status post appendectomy. The most proximal aspect of the appendix appears to remain with no evidence of appendicitis. There are a few diverticuli in the region of the cecum. Vascular/Lymphatic: Calcified atherosclerotic changes identified in the nonaneurysmal aorta. No adenopathy identified. Reproductive: Prostate is unremarkable. Other: No free air. There is a small amount of free fluid in the abdomen tracking into the pelvis. The previously identified periumbilical hernia has been repaired. There is a ventral hernia in the right anterolateral upper pelvic region. Fluid tracks into this hernia. The hernia is otherwise fat containing. Musculoskeletal: Degenerative changes in the hips, left greater than right. IMPRESSION: 1. There is a small bowel obstruction with a transition point in the mid ileum. Distal to the transition point, the small bowel is relatively decompressed with associated regions of wall thickening and mucosal enhancement. The wall thickening and mucosal enhancement are consistent with a Crohn's flare. 2. Mucosal enhancement in the distal sigmoid colon and rectum may also be associated with the Crohn's flare. 3. Fluid in the rectum and distal sigmoid colon consistent with diarrhea. 4. Mild reactive fluid in the abdomen and pelvis as above. 5. There are few colonic diverticuli in the region of the cecum. No diverticulitis. 6. Hepatic steatosis. 7. Calcified atherosclerotic changes in the nonaneurysmal aorta. 8. There is a ventral hernia in the right anterolateral upper pelvic region. Electronically Signed   By: Dorise Bullion III M.D.   On: 06/10/2021 10:14    Assessment / Plan:  61) 66 year old male with history of  ileal Crohn's disease and chronic diarrhea admitted to the hospital 06/10/2021 with N/V, upper and RLQ pain. CTAP identified a SBO with a transition point in the mid ileum, distal to the transition point the small bowel is  decompressed with associated regions of wall thickening and mucosal enhancement consistent with a Crohn's flare.  Fluid in the rectum and distal sigmoid colon with associated mucosal enhancement suggestive of active colitis and a ventral hernia in the right anterior lateral upper pelvic region. Hep and Hep C serologies negative (collected in anticipation for future biologic therapy for Crohn's disease). Quantiferon gold pending. No N/V overnight or this am. Diarrhea x 2 episodes yesterday evening without recurrence.  Mild RLQ tenderness but no significant abdominal pain this morning.  He is passing gas per the rectum. -Clear liquid diet -NG tube if he develops abdominal distension or recurrent vomiting  -General surgery consult if he develops acute abdominal pain/vomiting/abdominal distention -No plans for any endoscopic evaluation at this time -Solumedrol discontinued secondary to worsening leukopenia with neutropenia, pancytopenia -Pain management per the hospitalist -Not a candidate for biologic therapy at this time due to leukopenia/pancytopenia  2) History of chronic leukopenia. Acute on chronic leukopenia after Solumedrol 56m IV x 2 doses with neutropenia. Admission WBC 3.3 -> today WBC 1.4. ANC 0.8. Patient saw hematologist 06/2018 who thought his leukopenia was multifactorial including history of Crohn's disease, Sulfasalazine thought to be a contributing factor.  -Solumedrol discontinued  -Neutropenic precautions  -CBC with diff in am -Hematology consult   3) Acute pancytopenia. WBC 3.3 -> 1.4 as noted above. RBC 4.67 -> 3.82. Hg 14.6 -> 11.9. PLT 129 -> 107. -See  plan in # 2  4) GERD -Pantoprazole 82m IV Q 24 hrs  5) Hepatic steatosis per CT. Normal LFTs.  Principal Problem:   Crohn's colitis, with intestinal obstruction [place] Problems:   OSA on CPAP   Essential hypertension   DNR (do not resuscitate)     LOS: 1 day   CNoralyn Pick 06/11/2021,  08:40AM

## 2021-06-11 NOTE — Consult Note (Signed)
Hematology/Oncology Consult Note  Clinical Summary: Mr. 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  HPI: Mr. Kaj Vasil is a 66 year old male with medical history significant for Crohn's disease who presented to the emergency department on 06/10/2021 with generalized abdominal pain, nausea, vomiting, and diarrhea.  He was typically managed on mesalamine therapy at home and his symptoms were previously well controlled.  CT scan was performed which showed bowel obstruction with a transition point in the mid ileum.  He was also noted to have wall thickening and mucosal enhancement most consistent with a Crohn's flare.  Due to concern for these findings the patient was admitted to the hospital for further evaluation and management.  The patient has a longstanding history of leukopenia.  On 03/15/2017 the patient was noted to have white blood cell count of 2.9 with an [place] of 1200.  On admission yesterday the patient had white blood cell count of 3.3 (no differential was ordered).  Today the patient was noted to have a white blood cell count of 1.4 with hemoglobin of 11.9 and platelets of 107.  His ANC dropped down to 800.  Due to concern for these findings hematology was consulted.  Of note the patient was previously followed by Dr. Humphrey Rolls at [facility] for the same issue.  The patient was last seen on 10/02/2018.  At that time the etiology of the leukopenia was thought to be multifactorial with likely bone marrow suppression from chronic disease as well as suppression from Crohn's medication.  On exam today Mr. Novosel reports that he is doing well today.  He notes that he had a bowel movement yesterday which was yellowish/green but has not had a bowel movement today.  He notes that his abdominal pain is under better control today.  He denies any nausea or vomiting.   He has been tolerating his liquid diet without difficulty.  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  He notes that he has long been aware of his leukopenia and that no clear answer diagnosis has been provided.  He currently denies any fevers, chills, sweats, nausea, vomiting.  Full 10 point ROS was otherwise negative.  O:  Vitals:   06/11/21 0805 06/11/21 1203  BP:  (!) 112/93  Pulse:    Resp: 20 19  Temp: 98.3 F (36.8 C) 98.5 F (36.9 C)  SpO2: 92% 94%      Latest Ref Rng & Units 06/11/2021    1:30 AM 06/10/2021    8:08 AM 03/16/2017    5:45 AM  CMP  Glucose 70 - 99 mg/dL 126   121   107    BUN 8 - 23 mg/dL 10   14   7     Creatinine 0.61 - 1.24 mg/dL 0.56   0.84   0.81    Sodium 135 - 145 mmol/L 130   135   135    Potassium 3.5 - 5.1 mmol/L 4.3   3.9   4.4    Chloride 98 - 111 mmol/L 96   93   100    CO2 22 - 32 mmol/L 27   32   26    Calcium 8.9 - 10.3 mg/dL 8.4   9.1   8.7    Total Protein 6.5 -  8.1 g/dL  7.4   6.1    Total Bilirubin 0.3 - 1.2 mg/dL  1.0   0.8    Alkaline Phos 38 - 126 U/L  59   64    AST 15 - 41 U/L  18   20    ALT 0 - 44 U/L  12   14        Latest Ref Rng & Units 06/11/2021    1:30 AM 06/10/2021    8:08 AM 03/15/2017   11:18 PM  CBC  WBC 4.0 - 10.5 K/uL 1.4   3.3   2.9    Hemoglobin 13.0 - 17.0 g/dL 11.9   14.6   13.3    Hematocrit 39.0 - 52.0 % 34.8   43.6   39.4    Platelets 150 - 400 K/uL 107   129   169        GENERAL: well appearing elderly Caucasian male in NAD  SKIN: skin color, texture, turgor are normal, no rashes or significant lesions EYES: conjunctiva are pink and non-injected, sclera clear LUNGS: clear to auscultation and percussion with normal breathing effort HEART: regular rate & rhythm and no murmurs and no lower extremity edema Musculoskeletal: no cyanosis of digits and no clubbing  PSYCH: alert & oriented x 3,  fluent speech NEURO: no focal motor/sensory deficits  Assessment/Plan: Mr. 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  # Chronic Neutropenia with Recent Acute Worsening --OK to continue steroid therapy per GI recommendations --Strongly suspect that the chronic neutropenia secondary to inflammation from his Crohn's disease as well as likely contribution from his Crohn's medications. --will start granix 300 mcg IM daily to help bolster neutrophil count. Can stop once ANC is >1500 --will order nutritional studies with Vitamin B12, folate, MMA, and homocysteine to assure no nutritional deficinies are causing the neutropenia --Hematology will continue to follow  # Bowel Obstruction # Crohn's Flare -- continue supportive care and fluids per primary team and GI.    Ledell Peoples, MD Department of Hematology/Oncology [place] at [facility] Phone: [phone] Pager: [phone] Email: [email or web address]

## 2021-06-11 NOTE — Progress Notes (Signed)
Pt already on CPAP upon my arrival and tolerating well.

## 2021-06-11 NOTE — Progress Notes (Signed)
PROGRESS NOTE                                                                                                                                                                                                             Patient Demographics:    Maxwell Li, is a 66 y.o. 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  No nausea.  Overall slightly better.   Assessment  & Plan :   Possible SBO with right lower quadrant abdominal pain most likely due to underlying Crohn's disease with possible flare versus worsening inflammation. he is being managed conservatively with bowel rest, IV fluids and pain control.  GI following, currently no signs of acute abdomen or impending surgery.  No nausea or vomiting.  He is passing flatus and having loose stools.  May benefit from steroids.  Longstanding history of leukopenia.  Likely slightly worse due to IV fluids and dilution, per patient had extensive work-up several years ago at [facility] and they were leaning towards the diagnosis of CLL however he could not follow-up due to lack of insurance.  Have ordered peripheral smear, GI has consulted  hematology as well.  OSA.  Nighttime CPAP  Hypertension.  On Norvasc and metoprolol.        Condition - Fair  Family Communication  :  None present   Code Status :  Full  Consults  :  GI  PUD Prophylaxis :  PPI   Procedures  :     CT -  1. There is a small bowel  obstruction with a transition point in the mid ileum. Distal to the transition point, the small bowel is relatively decompressed with associated regions of wall thickening and mucosal enhancement. The wall thickening and mucosal enhancement are consistent with a Crohn's flare. 2. Mucosal enhancement in the distal sigmoid colon and rectum may also be associated with the Crohn's flare. 3. Fluid in the rectum and distal sigmoid colon consistent with diarrhea. 4. Mild reactive fluid in the abdomen and pelvis as above. 5. There are few colonic diverticuli in the region of the cecum. No diverticulitis. 6. Hepatic steatosis. 7. Calcified atherosclerotic changes in the nonaneurysmal aorta. 8. There is a ventral hernia in the right anterolateral upper pelvic region.       Disposition Plan  :    Status is: Inpatient  DVT Prophylaxis  :    SCDs Start: 06/10/21 1413   Lab Results  Component Value Date   PLT 107 (L) 06/11/2021    Diet :  Diet Order             Diet clear liquid Room service appropriate? Yes; Fluid consistency: Thin  Diet effective now                    Inpatient Medications  Scheduled Meds:  amLODipine  5 mg Oral Daily   pantoprazole (PROTONIX) IV  40 mg Intravenous Q24H   Continuous Infusions:  lactated ringers Stopped (06/10/21 1743)   PRN Meds:.acetaminophen **OR** acetaminophen, albuterol, dicyclomine, hydrALAZINE, melatonin, morphine injection, ondansetron, oxyCODONE  Antibiotics  :    Anti-infectives (From admission, onward)    None        Time Spent in minutes  30   Lala Lund M.D on 06/11/2021 at 11:18 AM  To page go to [email or web address]   [facility] -  Office   [phone]  See all Orders from today for further details    Objective:   Vitals:   06/10/21 2040 06/10/21 2328 06/11/21 0051 06/11/21 0805  BP: 121/76  112/72   Pulse: 79 80 72   Resp: 20 18 18 20   Temp: 98.1 F (36.7 C)  98.8 F (37.1 C) 98.3 F (36.8 C)  TempSrc: Oral  Axillary Oral  SpO2: 91% 93% 93% 92%    Wt Readings from Last 3 Encounters:  03/16/17 85.6 kg  02/08/17 86.6 kg  02/20/16 85.7 kg     Intake/Output Summary (Last 24 hours) at 06/11/2021 1118 Last data filed at 06/11/2021 0027 Gross per 24 hour  Intake 113.52 ml  Output --  Net 113.52 ml     Physical Exam  Awake Alert, No new F.N deficits, Normal affect [place].AT,PERRAL Supple Neck, No JVD,   Symmetrical Chest wall movement, Good air movement bilaterally, CTAB RRR,No Gallops,Rubs or new Murmurs,  +ve B.Sounds, Abd Soft, mild RLQ tenderness,   No Cyanosis, Clubbing or edema      Data Review:    CBC Recent Labs  Lab 06/10/21 0808 06/11/21 0130  WBC 3.3* 1.4*  HGB 14.6 11.9*  HCT 43.6 34.8*  PLT 129* 107*  MCV 93.4 91.1  MCH 31.3 31.2  MCHC 33.5 34.2  RDW 14.6 14.2  LYMPHSABS  --  0.6*  MONOABS  --  0.1  EOSABS  --  0.0  BASOSABS  --  0.0    Electrolytes Recent Labs  Lab 06/10/21 0808 06/10/21 2011 06/11/21 0130  NA 135  --  130*  K 3.9  --  4.3  CL 93*  --  96*  CO2 32  --  27  GLUCOSE 121*  --  126*  BUN 14  --  10  CREATININE 0.84  --  0.56*  CALCIUM 9.1  --  8.4*  AST 18  --   --   ALT 12  --   --   ALKPHOS 59  --   --   BILITOT 1.0  --   --   ALBUMIN 3.7  --   --   CRP  --  1.5*  --     Micro Results No results found for this or any previous visit (from the past 240 hour(s)).  Radiology Reports CT ABDOMEN PELVIS W CONTRAST  Result Date: 06/10/2021 CLINICAL DATA:  Right lower quadrant pain. History of appendectomy. History of Crohn's disease. Vomiting and diarrhea. EXAM: CT ABDOMEN AND PELVIS WITH CONTRAST TECHNIQUE: Multidetector CT imaging of the abdomen  and pelvis was performed using the standard protocol following bolus administration of intravenous contrast. RADIATION DOSE REDUCTION: This exam was performed according to the departmental dose-optimization program which includes automated exposure control, adjustment of the mA and/or kV according to patient size and/or use of iterative reconstruction technique. CONTRAST:  154m OMNIPAQUE IOHEXOL 300 MG/ML  SOLN COMPARISON:  March 24, 2007 FINDINGS: Lower chest: No acute abnormality. Hepatobiliary: Multiple tiny low-attenuation lesions in the liver too small to characterize but consistent with small cysts or hemangiomas. Mild hepatic steatosis. The gallbladder is normal. The portal vein is patent. Pancreas: Unremarkable. No pancreatic ductal dilatation or surrounding inflammatory changes. Spleen: Normal in size without focal abnormality. Adrenals/Urinary Tract: Adrenal glands are unremarkable. Kidneys are normal, without renal calculi, focal lesion, or hydronephrosis. Bladder is unremarkable. Stomach/Bowel: The stomach is normal. There is a small bowel obstruction with dilated loops of distal jejunum and proximal ileum. The transition point is in the ileum best seen on sagittal image 7 and axial image 34. The ileum distal to the transition point demonstrates regions of wall thickening and mucosal enhancement. A representative image demonstrating mucosal enhancement is coronal image 74. Most of the colon is normal in caliber to mildly decompressed. There is fluid in the distal sigmoid colon and rectum. There is mucosal enhancement in the region of the rectum and distal sigmoid colon. By report, the patient is status post appendectomy. The most proximal aspect of the appendix appears to remain with no evidence of appendicitis. There are a few diverticuli in the region of the cecum. Vascular/Lymphatic: Calcified atherosclerotic changes identified in the nonaneurysmal aorta. No adenopathy identified. Reproductive:  Prostate is unremarkable. Other: No free air. There is a small amount of free fluid in the abdomen tracking into the pelvis. The previously identified periumbilical hernia has been repaired. There is a ventral hernia in the right anterolateral upper pelvic region. Fluid tracks into this hernia. The hernia is otherwise fat containing. Musculoskeletal: Degenerative changes in the hips, left greater than right. IMPRESSION: 1. There is a small bowel obstruction with a transition point in the mid ileum. Distal to the transition point, the small bowel is relatively decompressed with associated regions of wall thickening and mucosal enhancement. The wall thickening and mucosal enhancement are consistent with a Crohn's flare. 2. Mucosal enhancement in the distal sigmoid colon and rectum may also be associated with the Crohn's flare. 3. Fluid in the rectum and distal sigmoid colon consistent with diarrhea. 4. Mild reactive fluid in the abdomen and pelvis as above. 5. There are few colonic diverticuli in the region of the cecum. No diverticulitis. 6.  Hepatic steatosis. 7. Calcified atherosclerotic changes in the nonaneurysmal aorta. 8. There is a ventral hernia in the right anterolateral upper pelvic region. Electronically Signed   By: Dorise Bullion III M.D.   On: 06/10/2021 10:14   DG Chest Port 1 View  Result Date: 06/11/2021 CLINICAL DATA:  Short of breath, abdominal pain EXAM: PORTABLE CHEST 1 VIEW COMPARISON:  Prior chest x-ray 03/16/2017 FINDINGS: Left subclavian approach cardiac rhythm maintenance device with leads in unchanged position overlying the right atrium and right ventricle. Cardiac and mediastinal contours are within normal limits. The lungs are clear. No pneumothorax or pleural effusion. No acute osseous abnormality. IMPRESSION: No active disease. Electronically Signed   By: Jacqulynn Cadet M.D.   On: 06/11/2021 08:33

## 2021-06-12 DIAGNOSIS — K529 Noninfective gastroenteritis and colitis, unspecified: Secondary | ICD-10-CM

## 2021-06-12 DIAGNOSIS — R195 Other fecal abnormalities: Secondary | ICD-10-CM | POA: Diagnosis not present

## 2021-06-12 DIAGNOSIS — K50012 Crohn's disease of small intestine with intestinal obstruction: Secondary | ICD-10-CM | POA: Diagnosis not present

## 2021-06-12 DIAGNOSIS — K56609 Unspecified intestinal obstruction, unspecified as to partial versus complete obstruction: Secondary | ICD-10-CM | POA: Diagnosis not present

## 2021-06-12 DIAGNOSIS — K50112 Crohn's disease of large intestine with intestinal obstruction: Secondary | ICD-10-CM | POA: Diagnosis not present

## 2021-06-12 LAB — CBC WITH DIFFERENTIAL/PLATELET
Abs Immature Granulocytes: 0.16 10*3/uL — ABNORMAL HIGH (ref 0.00–0.07)
Basophils Absolute: 0 10*3/uL (ref 0.0–0.1)
Basophils Relative: 0 %
Eosinophils Absolute: 0.1 10*3/uL (ref 0.0–0.5)
Eosinophils Relative: 1 %
HCT: 35.1 % — ABNORMAL LOW (ref 39.0–52.0)
Hemoglobin: 12 g/dL — ABNORMAL LOW (ref 13.0–17.0)
Immature Granulocytes: 1 %
Lymphocytes Relative: 4 %
Lymphs Abs: 0.5 10*3/uL — ABNORMAL LOW (ref 0.7–4.0)
MCH: 31.5 pg (ref 26.0–34.0)
MCHC: 34.2 g/dL (ref 30.0–36.0)
MCV: 92.1 fL (ref 80.0–100.0)
Monocytes Absolute: 0.7 10*3/uL (ref 0.1–1.0)
Monocytes Relative: 5 %
Neutro Abs: 12.6 10*3/uL — ABNORMAL HIGH (ref 1.7–7.7)
Neutrophils Relative %: 89 %
Platelets: 109 10*3/uL — ABNORMAL LOW (ref 150–400)
RBC: 3.81 MIL/uL — ABNORMAL LOW (ref 4.22–5.81)
RDW: 13.9 % (ref 11.5–15.5)
WBC: 14.1 10*3/uL — ABNORMAL HIGH (ref 4.0–10.5)
nRBC: 0 % (ref 0.0–0.2)

## 2021-06-12 LAB — GASTROINTESTINAL PANEL BY PCR, STOOL (REPLACES STOOL CULTURE)

## 2021-06-12 LAB — COMPREHENSIVE METABOLIC PANEL
ALT: 11 U/L (ref 0–44)
AST: 19 U/L (ref 15–41)
Albumin: 3.2 g/dL — ABNORMAL LOW (ref 3.5–5.0)
Alkaline Phosphatase: 45 U/L (ref 38–126)
Anion gap: 11 (ref 5–15)
BUN: 9 mg/dL (ref 8–23)
CO2: 28 mmol/L (ref 22–32)
Calcium: 8.9 mg/dL (ref 8.9–10.3)
Chloride: 94 mmol/L — ABNORMAL LOW (ref 98–111)
Creatinine, Ser: 0.61 mg/dL (ref 0.61–1.24)
GFR, Estimated: >60 mL/min (ref >60)
Glucose, Bld: 125 mg/dL — ABNORMAL HIGH (ref 70–99)
Potassium: 4.5 mmol/L (ref 3.5–5.1)
Sodium: 133 mmol/L — ABNORMAL LOW (ref 135–145)
Total Bilirubin: 0.8 mg/dL (ref 0.3–1.2)
Total Protein: 6.6 g/dL (ref 6.5–8.1)

## 2021-06-12 LAB — C-REACTIVE PROTEIN: CRP: 0.6 mg/dL (ref <1.0)

## 2021-06-12 LAB — BRAIN NATRIURETIC PEPTIDE: B Natriuretic Peptide: 43.2 pg/mL (ref 0.0–100.0)

## 2021-06-12 LAB — VITAMIN B12: Vitamin B-12: 235 pg/mL (ref 180–914)

## 2021-06-12 LAB — MAGNESIUM: Magnesium: 1.6 mg/dL — ABNORMAL LOW (ref 1.7–2.4)

## 2021-06-12 LAB — FOLATE: Folate: 12.3 ng/mL (ref >5.9)

## 2021-06-12 MED ORDER — MAGNESIUM SULFATE 4 GM/100ML IV SOLN
4.0000 g | Freq: Once | INTRAVENOUS | Status: AC
  Administered 2021-06-12: 4 g via INTRAVENOUS
  Filled 2021-06-12: qty 100

## 2021-06-12 MED ORDER — CYANOCOBALAMIN 1000 MCG/ML IJ SOLN
1000.0000 ug | INTRAMUSCULAR | Status: DC
  Administered 2021-06-12: 1000 ug via SUBCUTANEOUS
  Filled 2021-06-12: qty 1

## 2021-06-12 NOTE — Progress Notes (Signed)
PROGRESS NOTE                                                                                                                                                                                                             Patient Demographics:    Maxwell Li, is a 66 y.o. 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 2 years ago who had no medical follow-up for several years due to poor insurance, comes in with abdominal pain in the right lower quadrant diagnosed with small bowel obstruction likely due to Crohn's flare and admitted to the hospital   Subjective:   Patient sitting up in chair denies any headache chest or abdominal pain, right lower quadrant discomfort much improved, passing flatus having bowel movements overall feels better.  No nausea.   Assessment  & Plan :   Possible SBO with right lower quadrant abdominal pain most likely due to underlying Crohn's disease with possible flare versus worsening inflammation. he is being managed conservatively with bowel rest, IV fluids and pain control.  GI following, currently no signs of acute abdomen or impending surgery.  No nausea or vomiting.  He is passing flatus and having loose stools.  Has been placed on steroids, GI monitoring clinically improved.  Longstanding history of leukopenia.  Likely slightly worse due to IV fluids and dilution, per patient had extensive work-up several years ago at [facility] and they were leaning towards the diagnosis of CLL however he could not follow-up due to lack of insurance.  Stable peripheral smear per hematology received Granix  on 06/11/2021 with good response.  Post discharge will follow with Dr. Lorenso Courier oncology.  OSA.  Nighttime CPAP  Hypertension.  On Norvasc and metoprolol.  Hypomagnesemia.  Replaced.    C. difficile colonization.  Supportive care.      Condition - Fair  Family Communication  :  None present   Code Status :  Full  Consults  :  GI  PUD Prophylaxis :  PPI  Procedures  :     CT -  1. There is a small bowel obstruction with a transition point in the mid ileum. Distal to the transition point, the small bowel is relatively decompressed with associated regions of wall thickening and mucosal enhancement. The wall thickening and mucosal enhancement are consistent with a Crohn's flare. 2. Mucosal enhancement in the distal sigmoid colon and rectum may also be associated with the Crohn's flare. 3. Fluid in the rectum and distal sigmoid colon consistent with diarrhea. 4. Mild reactive fluid in the abdomen and pelvis as above. 5. There are few colonic diverticuli in the region of the cecum. No diverticulitis. 6. Hepatic steatosis. 7. Calcified atherosclerotic changes in the nonaneurysmal aorta. 8. There is a ventral hernia in the right anterolateral upper pelvic region.       Disposition Plan  :    Status is: Inpatient  DVT Prophylaxis  :    heparin injection 5,000 Units Start: 06/11/21 1400 SCDs Start: 06/10/21 1413   Lab Results  Component Value Date   PLT 109 (L) 06/12/2021    Diet :  Diet Order             Diet clear liquid Room service appropriate? Yes; Fluid consistency: Thin  Diet effective now                    Inpatient Medications  Scheduled Meds:  amLODipine  5 mg Oral Daily   cyanocobalamin  1,000 mcg Subcutaneous Q30 days   heparin injection (subcutaneous)  5,000 Units Subcutaneous Q8H   loratadine  10 mg Oral Daily   methylPREDNISolone (SOLU-MEDROL) injection  40 mg Intravenous Q24H   pantoprazole (PROTONIX) IV  40 mg Intravenous Q24H   Tbo-filgastrim  (GRANIX) SQ  300 mcg Subcutaneous q1800   Continuous Infusions:  lactated ringers 75 mL/hr at 06/11/21 2117   magnesium sulfate bolus IVPB 4 g (06/12/21 0916)   PRN Meds:.acetaminophen **OR** acetaminophen, albuterol, dicyclomine, hydrALAZINE, melatonin, morphine injection, ondansetron, oxyCODONE  Antibiotics  :    Anti-infectives (From admission, onward)    None        Time Spent in minutes  30   Lala Lund M.D on 06/12/2021 at 10:51 AM  To page go to [email or web address]   [facility] -  Office  [phone]  See all Orders from today for further details    Objective:   Vitals:   06/11/21 2002 06/11/21 2335 06/12/21 0348 06/12/21 0827  BP: 120/63 98/64 130/83 140/79  Pulse: 84 74 100 97  Resp: 18 18 18 16   Temp: 98.5 F (36.9 C) 97.8 F (36.6 C) 97.8 F (36.6 C) 97.9 F (36.6 C)  TempSrc: Oral Oral Oral Oral  SpO2: 93% 93%  90%    Wt Readings from Last 3 Encounters:  03/16/17 85.6 kg  02/08/17 86.6 kg  02/20/16 85.7 kg    No intake or output data in the 24 hours ending 06/12/21 1051    Physical Exam  Awake Alert, No new F.N deficits, Normal affect [place].AT,PERRAL Supple Neck, No JVD,   Symmetrical Chest wall movement, Good air movement bilaterally, CTAB RRR,No Gallops, Rubs or new Murmurs,  +ve B.Sounds, Abd Soft, No tenderness,   No Cyanosis, Clubbing or edema      Data Review:    CBC Recent Labs  Lab 06/10/21 0808 06/11/21 0130 06/12/21 0055  WBC 3.3* 1.4* 14.1*  HGB 14.6 11.9* 12.0*  HCT 43.6 34.8* 35.1*  PLT 129* 107* 109*  MCV  93.4 91.1 92.1  MCH 31.3 31.2 31.5  MCHC 33.5 34.2 34.2  RDW 14.6 14.2 13.9  LYMPHSABS  --  0.6* 0.5*  MONOABS  --  0.1 0.7  EOSABS  --  0.0 0.1  BASOSABS  --  0.0 0.0    Electrolytes Recent Labs  Lab 06/10/21 0808 06/10/21 2011 06/11/21 0130 06/12/21 0055  NA 135  --  130* 133*  K 3.9  --  4.3 4.5  CL 93*  --  96* 94*  CO2 32  --  27 28  GLUCOSE 121*  --  126* 125*  BUN 14  --  10 9   CREATININE 0.84  --  0.56* 0.61  CALCIUM 9.1  --  8.4* 8.9  AST 18  --   --  19  ALT 12  --   --  11  ALKPHOS 59  --   --  45  BILITOT 1.0  --   --  0.8  ALBUMIN 3.7  --   --  3.2*  MG  --   --   --  1.6*  CRP  --  1.5*  --  0.6  BNP  --   --   --  43.2     Radiology Reports CT ABDOMEN PELVIS W CONTRAST  Result Date: 06/10/2021 CLINICAL DATA:  Right lower quadrant pain. History of appendectomy. History of Crohn's disease. Vomiting and diarrhea. EXAM: CT ABDOMEN AND PELVIS WITH CONTRAST TECHNIQUE: Multidetector CT imaging of the abdomen and pelvis was performed using the standard protocol following bolus administration of intravenous contrast. RADIATION DOSE REDUCTION: This exam was performed according to the departmental dose-optimization program which includes automated exposure control, adjustment of the mA and/or kV according to patient size and/or use of iterative reconstruction technique. CONTRAST:  148m OMNIPAQUE IOHEXOL 300 MG/ML  SOLN COMPARISON:  March 24, 2007 FINDINGS: Lower chest: No acute abnormality. Hepatobiliary: Multiple tiny low-attenuation lesions in the liver too small to characterize but consistent with small cysts or hemangiomas. Mild hepatic steatosis. The gallbladder is normal. The portal vein is patent. Pancreas: Unremarkable. No pancreatic ductal dilatation or surrounding inflammatory changes. Spleen: Normal in size without focal abnormality. Adrenals/Urinary Tract: Adrenal glands are unremarkable. Kidneys are normal, without renal calculi, focal lesion, or hydronephrosis. Bladder is unremarkable. Stomach/Bowel: The stomach is normal. There is a small bowel obstruction with dilated loops of distal jejunum and proximal ileum. The transition point is in the ileum best seen on sagittal image 7 and axial image 34. The ileum distal to the transition point demonstrates regions of wall thickening and mucosal enhancement. A representative image demonstrating mucosal enhancement is  coronal image 74. Most of the colon is normal in caliber to mildly decompressed. There is fluid in the distal sigmoid colon and rectum. There is mucosal enhancement in the region of the rectum and distal sigmoid colon. By report, the patient is status post appendectomy. The most proximal aspect of the appendix appears to remain with no evidence of appendicitis. There are a few diverticuli in the region of the cecum. Vascular/Lymphatic: Calcified atherosclerotic changes identified in the nonaneurysmal aorta. No adenopathy identified. Reproductive: Prostate is unremarkable. Other: No free air. There is a small amount of free fluid in the abdomen tracking into the pelvis. The previously identified periumbilical hernia has been repaired. There is a ventral hernia in the right anterolateral upper pelvic region. Fluid tracks into this hernia. The hernia is otherwise fat containing. Musculoskeletal: Degenerative changes in the hips, left greater than right.  IMPRESSION: 1. There is a small bowel obstruction with a transition point in the mid ileum. Distal to the transition point, the small bowel is relatively decompressed with associated regions of wall thickening and mucosal enhancement. The wall thickening and mucosal enhancement are consistent with a Crohn's flare. 2. Mucosal enhancement in the distal sigmoid colon and rectum may also be associated with the Crohn's flare. 3. Fluid in the rectum and distal sigmoid colon consistent with diarrhea. 4. Mild reactive fluid in the abdomen and pelvis as above. 5. There are few colonic diverticuli in the region of the cecum. No diverticulitis. 6. Hepatic steatosis. 7. Calcified atherosclerotic changes in the nonaneurysmal aorta. 8. There is a ventral hernia in the right anterolateral upper pelvic region. Electronically Signed   By: Dorise Bullion III M.D.   On: 06/10/2021 10:14   DG Chest Port 1 View  Result Date: 06/11/2021 CLINICAL DATA:  Short of breath, abdominal pain  EXAM: PORTABLE CHEST 1 VIEW COMPARISON:  Prior chest x-ray 03/16/2017 FINDINGS: Left subclavian approach cardiac rhythm maintenance device with leads in unchanged position overlying the right atrium and right ventricle. Cardiac and mediastinal contours are within normal limits. The lungs are clear. No pneumothorax or pleural effusion. No acute osseous abnormality. IMPRESSION: No active disease. Electronically Signed   By: Jacqulynn Cadet M.D.   On: 06/11/2021 08:33   DG Abd Portable 1V  Result Date: 06/11/2021 CLINICAL DATA:  Abdominal pain.  Crohn's disease. EXAM: PORTABLE ABDOMEN - 1 VIEW COMPARISON:  06/10/2021 CT FINDINGS: A few mildly distended loops of gas-filled small bowel are noted within the mid abdomen. Gas is noted within the colon and rectum. No suspicious calcifications are identified. No acute bony abnormalities are present. IMPRESSION: Mildly distended small bowel loops which may represent partial small bowel obstruction or ileus as identified on prior CT. Electronically Signed   By: Margarette Canada M.D.   On: 06/11/2021 11:39

## 2021-06-12 NOTE — Progress Notes (Signed)
  Transition of Care [facility]) Screening Note   Patient Details  Name: Maxwell Li Date of Birth: 1955-11-16   Transition of Care [facility]) CM/SW Contact:    Cyndi Bender, RN Phone Number: 06/12/2021, 8:17 AM    Transition of Care Department [facility]) has reviewed patient and no TOC needs have been identified at this time. We will continue to monitor patient advancement through interdisciplinary progression rounds. If new patient transition needs arise, please place a TOC consult.

## 2021-06-12 NOTE — Progress Notes (Signed)
Daily Rounding Note  06/12/2021, 1:41 PM  LOS: 2 days   SUBJECTIVE:   Chief complaint:   SBO.  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  2 watery, nonbloody stools yesterday, per his usual pattern.  Not much abdominal pain.  No nausea or vomiting, tolerating clear liquids.  OBJECTIVE:         Vital signs in last 24 hours:    Temp:  [97.6 F (36.4 C)-98.5 F (36.9 C)] 98.2 F (36.8 C) (06/05 1229) Pulse Rate:  [74-100] 96 (06/05 1229) Resp:  [16-18] 16 (06/05 1229) BP: (98-140)/(63-83) 122/78 (06/05 1229) SpO2:  [90 %-93 %] 90 % (06/05 1229)   There were no vitals filed for this visit. General: Looks somewhat cushingoid,. Heart: RRR. Chest: Clear bilaterally without labored breathing. Abdomen: Soft, not tender, not distended.  Active bowel sounds. Extremities: No CCE. Neuro/Psych: Alert.  Oriented x3.  Fluid speech.  No gross deficits, no tremor.  Intake/Output from previous day: No intake/output data recorded.  Intake/Output this shift: No intake/output data recorded.  Lab Results: Recent Labs    06/10/21 0808 06/11/21 0130 06/12/21 0055  WBC 3.3* 1.4* 14.1*  HGB 14.6 11.9* 12.0*  HCT 43.6 34.8* 35.1*  PLT 129* 107* 109*   BMET Recent Labs    06/10/21 0808 06/11/21 0130 06/12/21 0055  NA 135 130* 133*  K 3.9 4.3 4.5  CL 93* 96* 94*  CO2 32 27 28  GLUCOSE 121* 126* 125*  BUN 14 10 9   CREATININE 0.84 0.56* 0.61  CALCIUM 9.1 8.4* 8.9   LFT Recent Labs    06/10/21 0808 06/12/21 0055  PROT 7.4 6.6  ALBUMIN 3.7 3.2*  AST 18 19  ALT 12 11  ALKPHOS 59 45  BILITOT 1.0 0.8   PT/INR No results for input(s): LABPROT, INR in the last 72 hours. Hepatitis Panel Recent Labs    06/11/21 0130  HEPBSAG NON REACTIVE  HCVAB NON REACTIVE    Studies/Results: DG Chest Port 1 View  Result Date: 06/11/2021 CLINICAL DATA:  Short of breath, abdominal pain EXAM: PORTABLE CHEST 1 VIEW  COMPARISON:  Prior chest x-ray 03/16/2017 FINDINGS: Left subclavian approach cardiac rhythm maintenance device with leads in unchanged position overlying the right atrium and right ventricle. Cardiac and mediastinal contours are within normal limits. The lungs are clear. No pneumothorax or pleural effusion. No acute osseous abnormality. IMPRESSION: No active disease. Electronically Signed   By: Jacqulynn Cadet M.D.   On: 06/11/2021 08:33   DG Abd Portable 1V  Result Date: 06/11/2021 CLINICAL DATA:  Abdominal pain.  Crohn's disease. EXAM: PORTABLE ABDOMEN - 1 VIEW COMPARISON:  06/10/2021 CT FINDINGS: A few mildly distended loops of gas-filled small bowel are noted within the mid abdomen. Gas is noted within the colon and rectum. No suspicious calcifications are identified. No acute bony abnormalities are present. IMPRESSION: Mildly distended small bowel loops which may represent partial small bowel obstruction or ileus as identified on prior CT. Electronically Signed   By: Margarette Canada M.D.   On: 06/11/2021 11:39    Scheduled Meds:  amLODipine  5 mg Oral Daily   cyanocobalamin  1,000 mcg Subcutaneous Q30 days   heparin injection (subcutaneous)  5,000 Units Subcutaneous Q8H   loratadine  10 mg Oral Daily   methylPREDNISolone (SOLU-MEDROL) injection  40 mg Intravenous Q24H   pantoprazole (PROTONIX) IV  40 mg Intravenous Q24H   Tbo-filgastrim (GRANIX) SQ  300 mcg Subcutaneous q1800  Continuous Infusions:  lactated ringers 75 mL/hr at 06/11/21 2117   PRN Meds:.acetaminophen **OR** acetaminophen, albuterol, dicyclomine, hydrALAZINE, melatonin, morphine injection, ondansetron, oxyCODONE  ASSESMENT:     SBO in setting of Crohn's flare.   Ct W SBO and inflammation at mid ileum and ? At distal sigmoid and rectum; diarreal stool in rectum.  Home meds of Sulfasalazine, but takes only 1 per day rather thant the 6 pills that MD ordered.      Crohn's dz of ileum.  Chronic diarrhea.  Frequency of BMs about  2/day, loose to watery, nonbloody stools has been his pattern for decades.  C diff Ag positive, antigen and toxigenic c diff by pcr are negative: represents colonization not active C diff.   Pt opted to have PCP manage his dz, has seen Dr Kinnie Feil in past for procedure, dx.  Day 3 Solumedrol 40 mg daily (never ended up being held).   Hep B and C serologies are negative.  Quant gold testing pndg.  Fecal calprotectin in progress.    Progressive chronic Leukopenia, neutropenia, acute pancytopenia.  Oncology involved.  Day 2 Filgrastim.       Hyponatremia.  Na 130.. 133.      Hepatic steatosis.     PLAN     QF gold testing is pndg.    Advance to full liquid diet and if this is tolerated can advance to soft/low residue.    Does not require treatment for the positive C diff Ag     Azucena Freed  06/12/2021, 1:41 PM Phone [phone]

## 2021-06-12 NOTE — Plan of Care (Signed)

## 2021-06-13 ENCOUNTER — Inpatient Hospital Stay (HOSPITAL_COMMUNITY): Payer: Medicare HMO

## 2021-06-13 ENCOUNTER — Other Ambulatory Visit (HOSPITAL_COMMUNITY): Payer: Self-pay

## 2021-06-13 DIAGNOSIS — K50112 Crohn's disease of large intestine with intestinal obstruction: Secondary | ICD-10-CM | POA: Diagnosis not present

## 2021-06-13 DIAGNOSIS — K56600 Partial intestinal obstruction, unspecified as to cause: Secondary | ICD-10-CM | POA: Diagnosis not present

## 2021-06-13 DIAGNOSIS — K50012 Crohn's disease of small intestine with intestinal obstruction: Secondary | ICD-10-CM | POA: Diagnosis not present

## 2021-06-13 LAB — COMPREHENSIVE METABOLIC PANEL
ALT: 12 U/L (ref 0–44)
AST: 21 U/L (ref 15–41)
Albumin: 2.8 g/dL — ABNORMAL LOW (ref 3.5–5.0)
Alkaline Phosphatase: 50 U/L (ref 38–126)
Anion gap: 4 — ABNORMAL LOW (ref 5–15)
BUN: 9 mg/dL (ref 8–23)
CO2: 31 mmol/L (ref 22–32)
Calcium: 8.3 mg/dL — ABNORMAL LOW (ref 8.9–10.3)
Chloride: 97 mmol/L — ABNORMAL LOW (ref 98–111)
Creatinine, Ser: 0.6 mg/dL — ABNORMAL LOW (ref 0.61–1.24)
GFR, Estimated: >60 mL/min (ref >60)
Glucose, Bld: 124 mg/dL — ABNORMAL HIGH (ref 70–99)
Potassium: 4 mmol/L (ref 3.5–5.1)
Sodium: 132 mmol/L — ABNORMAL LOW (ref 135–145)
Total Bilirubin: 0.4 mg/dL (ref 0.3–1.2)
Total Protein: 5.7 g/dL — ABNORMAL LOW (ref 6.5–8.1)

## 2021-06-13 LAB — CBC WITH DIFFERENTIAL/PLATELET
Abs Immature Granulocytes: 0.27 10*3/uL — ABNORMAL HIGH (ref 0.00–0.07)
Basophils Absolute: 0 10*3/uL (ref 0.0–0.1)
Basophils Relative: 0 %
Eosinophils Absolute: 0 10*3/uL (ref 0.0–0.5)
Eosinophils Relative: 0 %
HCT: 31.5 % — ABNORMAL LOW (ref 39.0–52.0)
Hemoglobin: 10.7 g/dL — ABNORMAL LOW (ref 13.0–17.0)
Immature Granulocytes: 2 %
Lymphocytes Relative: 5 %
Lymphs Abs: 0.7 10*3/uL (ref 0.7–4.0)
MCH: 31.5 pg (ref 26.0–34.0)
MCHC: 34 g/dL (ref 30.0–36.0)
MCV: 92.6 fL (ref 80.0–100.0)
Monocytes Absolute: 0.4 10*3/uL (ref 0.1–1.0)
Monocytes Relative: 3 %
Neutro Abs: 12.6 10*3/uL — ABNORMAL HIGH (ref 1.7–7.7)
Neutrophils Relative %: 90 %
Platelets: 103 10*3/uL — ABNORMAL LOW (ref 150–400)
RBC: 3.4 MIL/uL — ABNORMAL LOW (ref 4.22–5.81)
RDW: 14.1 % (ref 11.5–15.5)
WBC: 14 10*3/uL — ABNORMAL HIGH (ref 4.0–10.5)
nRBC: 0 % (ref 0.0–0.2)

## 2021-06-13 LAB — C-REACTIVE PROTEIN: CRP: 0.6 mg/dL (ref <1.0)

## 2021-06-13 LAB — SEDIMENTATION RATE: Sed Rate: 14 mm/hr (ref 0–16)

## 2021-06-13 LAB — MAGNESIUM: Magnesium: 2.1 mg/dL (ref 1.7–2.4)

## 2021-06-13 LAB — BRAIN NATRIURETIC PEPTIDE: B Natriuretic Peptide: 54.7 pg/mL (ref 0.0–100.0)

## 2021-06-13 LAB — HOMOCYSTEINE: Homocysteine: 13.9 umol/L (ref 0.0–17.2)

## 2021-06-13 MED ORDER — PREDNISONE 10 MG PO TABS
ORAL_TABLET | ORAL | 0 refills | Status: DC
Start: 2021-06-13 — End: 2021-07-07
  Filled 2021-06-13: qty 95, 27d supply, fill #0

## 2021-06-13 MED ORDER — CYANOCOBALAMIN 1000 MCG/ML IJ SOLN: 1000.0000 ug | INTRAMUSCULAR | 0 refills | Status: DC

## 2021-06-13 NOTE — Plan of Care (Signed)
  Problem: Education: Goal: Knowledge of General Education information will improve Description: Including pain rating scale, medication(s)/side effects and non-pharmacologic comfort measures 06/13/2021 1332 by Terence Lux, RN Outcome: Adequate for Discharge 06/13/2021 0831 by Terence Lux, RN Outcome: Progressing   Problem: Health Behavior/Discharge Planning: Goal: Ability to manage health-related needs will improve 06/13/2021 1332 by Terence Lux, RN Outcome: Adequate for Discharge 06/13/2021 0831 by Terence Lux, RN Outcome: Progressing   Problem: Clinical Measurements: Goal: Ability to maintain clinical measurements within normal limits will improve Outcome: Adequate for Discharge Goal: Will remain free from infection Outcome: Adequate for Discharge Goal: Diagnostic test results will improve Outcome: Adequate for Discharge Goal: Respiratory complications will improve Outcome: Adequate for Discharge Goal: Cardiovascular complication will be avoided Outcome: Adequate for Discharge   Problem: Activity: Goal: Risk for activity intolerance will decrease 06/13/2021 1332 by Terence Lux, RN Outcome: Adequate for Discharge 06/13/2021 0831 by Terence Lux, RN Outcome: Progressing   Problem: Nutrition: Goal: Adequate nutrition will be maintained 06/13/2021 1332 by Terence Lux, RN Outcome: Adequate for Discharge 06/13/2021 0831 by Terence Lux, RN Outcome: Progressing   Problem: Coping: Goal: Level of anxiety will decrease 06/13/2021 1332 by Terence Lux, RN Outcome: Adequate for Discharge 06/13/2021 0831 by Terence Lux, RN Outcome: Progressing   Problem: Elimination: Goal: Will not experience complications related to bowel motility 06/13/2021 1332 by Terence Lux, RN Outcome: Adequate for Discharge 06/13/2021 0831 by Terence Lux, RN Outcome: Progressing Goal: Will not experience complications related to  urinary retention Outcome: Adequate for Discharge   Problem: Pain Managment: Goal: General experience of comfort will improve Outcome: Adequate for Discharge   Problem: Safety: Goal: Ability to remain free from injury will improve 06/13/2021 1332 by Terence Lux, RN Outcome: Adequate for Discharge 06/13/2021 0831 by Terence Lux, RN Outcome: Progressing   Problem: Skin Integrity: Goal: Risk for impaired skin integrity will decrease Outcome: Adequate for Discharge

## 2021-06-13 NOTE — Discharge Instructions (Addendum)
Follow with Primary MD Rubie Maid, MD in 7 days   Get CBC, CMP, B12, Magnesium -  checked next visit within 1 week by Primary MD   Activity: As tolerated with Full fall precautions use walker/cane & assistance as needed  Disposition Home    Diet: Heart Healthy   Special Instructions: If you have smoked or chewed Tobacco  in the last 2 yrs please stop smoking, stop any regular Alcohol  and or any Recreational drug use.  On your next visit with your primary care physician please Get Medicines reviewed and adjusted.  Please request your Prim.MD to go over all Hospital Tests and Procedure/Radiological results at the follow up, please get all Hospital records sent to your Prim MD by signing hospital release before you go home.  If you experience worsening of your admission symptoms, develop shortness of breath, life threatening emergency, suicidal or homicidal thoughts you must seek medical attention immediately by calling 911 or calling your MD immediately  if symptoms less severe.  You Must read complete instructions/literature along with all the possible adverse reactions/side effects for all the Medicines you take and that have been prescribed to you. Take any new Medicines after you have completely understood and accpet all the possible adverse reactions/side effects.

## 2021-06-13 NOTE — Discharge Summary (Signed)
Maxwell Li YBO:[id number] DOB: 04/15/1955 DOA: 06/10/2021  PCP: Rubie Maid, MD  Admit date: 06/10/2021  Discharge date: 06/13/2021  Admitted From: Home   Disposition:  Home   Recommendations for Outpatient Follow-up:   Follow up with PCP in 1-2 weeks  PCP Please obtain BMP/CBC, 2 view CXR in 1week,  (see Discharge instructions)   PCP Please follow up on the following pending results: Needs close outpatient GI and hematology follow-up.  Monitor B12 levels, please make sure he does not abruptly stop his prednisone taper it needs to be stopped by  GI.   Home Health: None   Equipment/Devices: None  Consultations: GI< Haem Discharge Condition: Stable    CODE STATUS: Full    Diet Recommendation: Heart Healthy     Chief Complaint  Patient presents with   Abdominal Pain     Brief history of present illness from the day of admission and additional interim summary    66 year old with history of Crohn's disease, dyslipidemia, hypertension and possible early diagnosis of CLL at [facility] 2 years ago who had no medical follow-up for several years due to poor insurance, comes in with abdominal pain in the right lower quadrant diagnosed with small bowel obstruction likely due to Crohn's flare and admitted to the hospital                                                                 Hospital Course     Possible SBO with right lower quadrant abdominal pain most likely due to underlying Crohn's disease with possible flare versus worsening inflammation. he is being managed conservatively with bowel rest, IV fluids and pain control.  GI following, currently no signs of acute abdomen or impending surgery.  No nausea or vomiting.  He is passing flatus and having loose stools.  Has been placed on PO steroids, post DC  follow-up with GI and PCP within a week.  Now close to baseline.  Note Will request PCP to make sure patient does not stop his steroids abruptly he must follow-up with GI before he stops his prednisone taper.   Longstanding history of leukopenia.  Likely slightly worse due to IV fluids and dilution, per patient had extensive work-up several years ago at [facility] and they were leaning towards the diagnosis of CLL however he could not follow-up due to lack of insurance.  Stable peripheral smear per hematology received Granix on 06/11/2021 with good response.  Post discharge will follow with Dr. Lorenso Courier oncology.   OSA.  Nighttime CPAP   Hypertension.  On Norvasc and metoprolol.   Hypomagnesemia.  Replaced.     C. difficile colonization.  Supportive care.   Borderline B12 levels.  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 monthly B12 shots subcu or IM at PCP  office.  For shot given here.  PCP to monitor  Discharge diagnosis     Principal Problem:   Crohn's colitis, with intestinal obstruction ([place]) Active Problems:   OSA on CPAP   Essential hypertension   DNR (do not resuscitate)    Discharge instructions    Discharge Instructions     Diet - low sodium heart healthy   Complete by: As directed    Discharge instructions   Complete by: As directed    Follow with Primary MD Rubie Maid, MD in 7 days   Get CBC, CMP, B12, Magnesium -  checked next visit within 1 week by Primary MD   Activity: As tolerated with Full fall precautions use walker/cane & assistance as needed  Disposition Home    Diet: Heart Healthy   Special Instructions: If you have smoked or chewed Tobacco  in the last 2 yrs please stop smoking, stop any regular Alcohol  and or any Recreational drug use.  On your next visit with your primary care physician please Get Medicines reviewed and adjusted.  Please request your Prim.MD to go over all Hospital Tests and Procedure/Radiological results  at the follow up, please get all Hospital records sent to your Prim MD by signing hospital release before you go home.  If you experience worsening of your admission symptoms, develop shortness of breath, life threatening emergency, suicidal or homicidal thoughts you must seek medical attention immediately by calling 911 or calling your MD immediately  if symptoms less severe.  You Must read complete instructions/literature along with all the possible adverse reactions/side effects for all the Medicines you take and that have been prescribed to you. Take any new Medicines after you have completely understood and accpet all the possible adverse reactions/side effects.   Increase activity slowly   Complete by: As directed        Discharge Medications   Allergies as of 06/13/2021       Reactions   Lidocaine Swelling   Throat swelling    Lovastatin    REACTION: Nausea   Shellfish Allergy Itching        Medication List     STOP taking these medications    ibuprofen 200 MG tablet Commonly known as: ADVIL       TAKE these medications    albuterol 108 (90 Base) MCG/ACT inhaler Commonly known as: VENTOLIN HFA Inhale 2 puffs into the lungs every 6 (six) hours as needed for wheezing or shortness of breath.   amLODipine 5 MG tablet Commonly known as: NORVASC Take 5 mg by mouth daily.   cyanocobalamin 1000 MCG/ML injection Commonly known as: (VITAMIN B-12) Inject 1 mL (1,000 mcg total) into the skin every 30 (thirty) days. At PCP office Start taking on: July 12, 2021   DICYCLOMINE HCL PO Take 20 mg by mouth at bedtime as needed (stomach spasms).   metoprolol tartrate 25 MG tablet Commonly known as: LOPRESSOR Take 1 tablet (25 mg total) by mouth 2 (two) times daily.   predniSONE 5 MG tablet Commonly known as: DELTASONE Label  & dispense according to the schedule below. 10 mg pills -  Prednisone 40 mg daily x 2 weeks; 30 mg daily  x 2-weeks, 20 mg daily for 4 weeks, do not  stop until you have seen by the gastroenterologist and they stop this medicine.  Do not stop abruptly.   PRESCRIPTION MEDICATION Inhale into the lungs at bedtime. CPAP   testosterone cypionate 200 MG/ML injection Commonly known as:  DEPOTESTOSTERONE CYPIONATE Inject 1 mL (200 mg total) into the muscle every 14 (fourteen) days.         Follow-up Information     Rubie Maid, MD. Schedule an appointment as soon as possible for a visit in 1 week(s).   Specialty: Family Medicine Contact information: [address] [place] [address] [phone]         Orson Slick, MD. Schedule an appointment as soon as possible for a visit in 1 week(s).   Specialty: Hematology and Oncology Contact information: [place] [address] [phone]         Mansouraty, Telford Nab., MD. Schedule an appointment as soon as possible for a visit in 1 week(s).   Specialties: Gastroenterology, Internal Medicine Contact information: [place] [address] [phone]                 Major procedures and Radiology Reports - PLEASE review detailed and final reports thoroughly  -       CT ABDOMEN PELVIS W CONTRAST  Result Date: 06/10/2021 CLINICAL DATA:  Right lower quadrant pain. History of appendectomy. History of Crohn's disease. Vomiting and diarrhea. EXAM: CT ABDOMEN AND PELVIS WITH CONTRAST TECHNIQUE: Multidetector CT imaging of the abdomen and pelvis was performed using the standard protocol following bolus administration of intravenous contrast. RADIATION DOSE REDUCTION: This exam was performed according to the departmental dose-optimization program which includes automated exposure control, adjustment of the mA and/or kV according to patient size and/or use of iterative reconstruction technique. CONTRAST:  138m OMNIPAQUE IOHEXOL 300 MG/ML  SOLN COMPARISON:  March 24, 2007 FINDINGS: Lower chest: No acute abnormality. Hepatobiliary:  Multiple tiny low-attenuation lesions in the liver too small to characterize but consistent with small cysts or hemangiomas. Mild hepatic steatosis. The gallbladder is normal. The portal vein is patent. Pancreas: Unremarkable. No pancreatic ductal dilatation or surrounding inflammatory changes. Spleen: Normal in size without focal abnormality. Adrenals/Urinary Tract: Adrenal glands are unremarkable. Kidneys are normal, without renal calculi, focal lesion, or hydronephrosis. Bladder is unremarkable. Stomach/Bowel: The stomach is normal. There is a small bowel obstruction with dilated loops of distal jejunum and proximal ileum. The transition point is in the ileum best seen on sagittal image 7 and axial image 34. The ileum distal to the transition point demonstrates regions of wall thickening and mucosal enhancement. A representative image demonstrating mucosal enhancement is coronal image 74. Most of the colon is normal in caliber to mildly decompressed. There is fluid in the distal sigmoid colon and rectum. There is mucosal enhancement in the region of the rectum and distal sigmoid colon. By report, the patient is status post appendectomy. The most proximal aspect of the appendix appears to remain with no evidence of appendicitis. There are a few diverticuli in the region of the cecum. Vascular/Lymphatic: Calcified atherosclerotic changes identified in the nonaneurysmal aorta. No adenopathy identified. Reproductive: Prostate is unremarkable. Other: No free air. There is a small amount of free fluid in the abdomen tracking into the pelvis. The previously identified periumbilical hernia has been repaired. There is a ventral hernia in the right anterolateral upper pelvic region. Fluid tracks into this hernia. The hernia is otherwise fat containing. Musculoskeletal: Degenerative changes in the hips, left greater than right. IMPRESSION: 1. There is a small bowel obstruction with a transition point in the mid ileum.  Distal to the transition point, the small bowel is relatively decompressed with associated regions of wall thickening and mucosal enhancement.  The wall thickening and mucosal enhancement are consistent with a Crohn's flare. 2. Mucosal enhancement in the distal sigmoid colon and rectum may also be associated with the Crohn's flare. 3. Fluid in the rectum and distal sigmoid colon consistent with diarrhea. 4. Mild reactive fluid in the abdomen and pelvis as above. 5. There are few colonic diverticuli in the region of the cecum. No diverticulitis. 6. Hepatic steatosis. 7. Calcified atherosclerotic changes in the nonaneurysmal aorta. 8. There is a ventral hernia in the right anterolateral upper pelvic region. Electronically Signed   By: Dorise Bullion III M.D.   On: 06/10/2021 10:14   DG Chest Port 1 View  Result Date: 06/11/2021 CLINICAL DATA:  Short of breath, abdominal pain EXAM: PORTABLE CHEST 1 VIEW COMPARISON:  Prior chest x-ray 03/16/2017 FINDINGS: Left subclavian approach cardiac rhythm maintenance device with leads in unchanged position overlying the right atrium and right ventricle. Cardiac and mediastinal contours are within normal limits. The lungs are clear. No pneumothorax or pleural effusion. No acute osseous abnormality. IMPRESSION: No active disease. Electronically Signed   By: Jacqulynn Cadet M.D.   On: 06/11/2021 08:33   DG Abd 2 Views  Result Date: 06/13/2021 CLINICAL DATA:  Crohn's colitis. EXAM: ABDOMEN - 2 VIEW COMPARISON:  Radiograph June 11, 2021 FINDINGS: The bowel gas pattern is normal. Prominence of the colonic haustral folds. There is no evidence of free air. Partially visualized intracardiac leads project over the right ventricle and right atrium. IMPRESSION: Prominence of the colonic haustral folds may reflect colitis. No evidence of bowel obstruction. Electronically Signed   By: Dahlia Bailiff M.D.   On: 06/13/2021 10:04   DG Abd Portable 1V  Result Date: 06/11/2021 CLINICAL  DATA:  Abdominal pain.  Crohn's disease. EXAM: PORTABLE ABDOMEN - 1 VIEW COMPARISON:  06/10/2021 CT FINDINGS: A few mildly distended loops of gas-filled small bowel are noted within the mid abdomen. Gas is noted within the colon and rectum. No suspicious calcifications are identified. No acute bony abnormalities are present. IMPRESSION: Mildly distended small bowel loops which may represent partial small bowel obstruction or ileus as identified on prior CT. Electronically Signed   By: Margarette Canada M.D.   On: 06/11/2021 11:39       Today   Subjective    Maxwell Li today has no headache,no chest abdominal pain,no new weakness tingling or numbness, feels much better wants to go home today.     Objective   Blood pressure 129/79, pulse 100, temperature 97.9 F (36.6 C), temperature source Oral, resp. rate 16, height 5' 3"  (1.6 m), weight 76.6 kg, SpO2 90 %.   Intake/Output Summary (Last 24 hours) at 06/13/2021 1226 Last data filed at 06/13/2021 0309 Gross per 24 hour  Intake 2293.09 ml  Output --  Net 2293.09 ml    Exam  Awake Alert, No new F.N deficits,    [place].AT,PERRAL Supple Neck,   Symmetrical Chest wall movement, Good air movement bilaterally, CTAB RRR,No Gallops,   +ve B.Sounds, Abd Soft, Non tender,  No Cyanosis, Clubbing or edema    Data Review   Recent Labs  Lab 06/10/21 0808 06/11/21 0130 06/12/21 0055 06/13/21 0139  WBC 3.3* 1.4* 14.1* 14.0*  HGB 14.6 11.9* 12.0* 10.7*  HCT 43.6 34.8* 35.1* 31.5*  PLT 129* 107* 109* 103*  MCV 93.4 91.1 92.1 92.6  MCH 31.3 31.2 31.5 31.5  MCHC 33.5 34.2 34.2 34.0  RDW 14.6 14.2 13.9 14.1  LYMPHSABS  --  0.6* 0.5* 0.7  MONOABS  --  0.1 0.7 0.4  EOSABS  --  0.0 0.1 0.0  BASOSABS  --  0.0 0.0 0.0    Recent Labs  Lab 06/10/21 0808 06/10/21 2011 06/11/21 0130 06/12/21 0055 06/13/21 0139  NA 135  --  130* 133* 132*  K 3.9  --  4.3 4.5 4.0  CL 93*  --  96* 94* 97*  CO2 32  --  27 28 31   GLUCOSE 121*  --  126* 125*  124*  BUN 14  --  10 9 9   CREATININE 0.84  --  0.56* 0.61 0.60*  CALCIUM 9.1  --  8.4* 8.9 8.3*  AST 18  --   --  19 21  ALT 12  --   --  11 12  ALKPHOS 59  --   --  45 50  BILITOT 1.0  --   --  0.8 0.4  ALBUMIN 3.7  --   --  3.2* 2.8*  MG  --   --   --  1.6* 2.1  CRP  --  1.5*  --  0.6 0.6  BNP  --   --   --  43.2 54.7    Total Time in preparing paper work, data evaluation and todays exam - 35 minutes  Lala Lund M.D on 06/13/2021 at 12:26 PM  [facility]

## 2021-06-13 NOTE — Progress Notes (Signed)
6/6 I spoke to patient via telephone, I explained the IM Letter and patient acknowledged. Patient does not remember rcv'g initial IM Letter, I advised that a copy would be mailed to his address on file. AMM

## 2021-06-13 NOTE — Plan of Care (Signed)
  Problem: Education: Goal: Knowledge of General Education information will improve Description: Including pain rating scale, medication(s)/side effects and non-pharmacologic comfort measures Outcome: Progressing   Problem: Health Behavior/Discharge Planning: Goal: Ability to manage health-related needs will improve Outcome: Progressing   Problem: Activity: Goal: Risk for activity intolerance will decrease Outcome: Progressing   Problem: Nutrition: Goal: Adequate nutrition will be maintained Outcome: Progressing   Problem: Coping: Goal: Level of anxiety will decrease Outcome: Progressing   Problem: Elimination: Goal: Will not experience complications related to bowel motility Outcome: Progressing   Problem: Safety: Goal: Ability to remain free from injury will improve Outcome: Progressing

## 2021-06-13 NOTE — Progress Notes (Signed)
Brief Hematology/Oncology Progress Note  Clinical Summary: Mr. Maxwell Li is a 66 year old male with medical history significant for Crohn's disease who presents with a Crohn's flare and worsening of his chronic leukopenia.  Interval History: --WBC increased from 1.4 on 6/4 to 14.1 on 06/12/2021 --Granix stopped on 06/12/2021 after 1 dose  --continued on steroid therapy per GI   O:  Vitals:   06/13/21 0338 06/13/21 0829  BP: 129/83 133/75  Pulse: 63 76  Resp: 18 16  Temp: 97.8 F (36.6 C) 97.9 F (36.6 C)  SpO2:  91%      Latest Ref Rng & Units 06/13/2021    1:39 AM 06/12/2021   12:55 AM 06/11/2021    1:30 AM  CMP  Glucose 70 - 99 mg/dL 124   125   126    BUN 8 - 23 mg/dL 9   9   10     Creatinine 0.61 - 1.24 mg/dL 0.60   0.61   0.56    Sodium 135 - 145 mmol/L 132   133   130    Potassium 3.5 - 5.1 mmol/L 4.0   4.5   4.3    Chloride 98 - 111 mmol/L 97   94   96    CO2 22 - 32 mmol/L 31   28   27     Calcium 8.9 - 10.3 mg/dL 8.3   8.9   8.4    Total Protein 6.5 - 8.1 g/dL 5.7   6.6     Total Bilirubin 0.3 - 1.2 mg/dL 0.4   0.8     Alkaline Phos 38 - 126 U/L 50   45     AST 15 - 41 U/L 21   19     ALT 0 - 44 U/L 12   11         Latest Ref Rng & Units 06/13/2021    1:39 AM 06/12/2021   12:55 AM 06/11/2021    1:30 AM  CBC  WBC 4.0 - 10.5 K/uL 14.0   14.1   1.4    Hemoglobin 13.0 - 17.0 g/dL 10.7   12.0   11.9    Hematocrit 39.0 - 52.0 % 31.5   35.1   34.8    Platelets 150 - 400 K/uL 103   109   107       Assessment/Plan: Mr. Maxwell Li is a 66 year old male with medical history significant for Crohn's disease who presents with a Crohn's flare and worsening of his chronic leukopenia.   # Chronic Neutropenia with Recent Acute Worsening--Resolved --OK to continue steroid therapy per GI recommendations --Strongly suspect that the chronic neutropenia secondary to inflammation from his Crohn's disease as well as likely contribution from his Crohn's medications. --patient  had marked increase in WBC with single dose of granix. Likely rise is due to granix and steroids. Hold further granix.  --will follow up nutritional studies with Vitamin B12, folate, MMA, and homocysteine to assure no nutritional deficiencies. Vitamin b12 appears borderline low. MMA pending.  --Hematology will continue to follow peripherally   # Bowel Obstruction # Crohn's Flare -- continue supportive care and fluids per primary team and GI.    Ledell Peoples, MD Department of Hematology/Oncology [place] [facility] Phone: [phone] Pager: [phone] Email: [email or web address]

## 2021-06-13 NOTE — Progress Notes (Signed)
Daily Rounding Note  06/13/2021, 11:59 AM  LOS: 3 days   SUBJECTIVE:   Chief complaint: Ileal Crohn's disease with flare associated with SBO.  Just now getting a solid food lunch.  Tolerated full liquids through breakfast.  No nausea, vomiting, abdominal pain.  Has had 2 loose, nonbloody stools in the last 24 hours, most recently this morning.  Overall feeling well.  OBJECTIVE:         Vital signs in last 24 hours:    Temp:  [97.8 F (36.6 C)-98.2 F (36.8 C)] 97.9 F (36.6 C) (06/06 0829) Pulse Rate:  [63-97] 76 (06/06 0829) Resp:  [16-18] 16 (06/06 0829) BP: (103-133)/(62-83) 133/75 (06/06 0829) SpO2:  [90 %-95 %] 91 % (06/06 0829) Weight:  [76.6 kg] 76.6 kg (06/05 2158) Last BM Date : 06/11/21 Filed Weights   06/12/21 2158  Weight: 76.6 kg   General: Looks somewhat chronically ill with rosacea.  Comfortable.  Alert. Heart: RRR. Chest: Clear bilaterally Abdomen: Active bowel sounds without distention.  Soft.  No tenderness Extremities: No CCE. Neuro/Psych: Oriented x3.  Appropriate.  Anxious.  Cooperative.  Fluid speech.  Intake/Output from previous day: 06/05 0701 - 06/06 0700 In: 2293.1 [P.O.:240; I.V.:2053.1] Out: -   Intake/Output this shift: No intake/output data recorded.  Lab Results: Recent Labs    06/11/21 0130 06/12/21 0055 06/13/21 0139  WBC 1.4* 14.1* 14.0*  HGB 11.9* 12.0* 10.7*  HCT 34.8* 35.1* 31.5*  PLT 107* 109* 103*   BMET Recent Labs    06/11/21 0130 06/12/21 0055 06/13/21 0139  NA 130* 133* 132*  K 4.3 4.5 4.0  CL 96* 94* 97*  CO2 27 28 31   GLUCOSE 126* 125* 124*  BUN 10 9 9   CREATININE 0.56* 0.61 0.60*  CALCIUM 8.4* 8.9 8.3*   LFT Recent Labs    06/12/21 0055 06/13/21 0139  PROT 6.6 5.7*  ALBUMIN 3.2* 2.8*  AST 19 21  ALT 11 12  ALKPHOS 45 50  BILITOT 0.8 0.4   PT/INR No results for input(s): LABPROT, INR in the last 72 hours. Hepatitis Panel Recent  Labs    06/11/21 0130  HEPBSAG NON REACTIVE  HCVAB NON REACTIVE    Studies/Results: DG Abd 2 Views  Result Date: 06/13/2021 CLINICAL DATA:  Crohn's colitis. EXAM: ABDOMEN - 2 VIEW COMPARISON:  Radiograph June 11, 2021 FINDINGS: The bowel gas pattern is normal. Prominence of the colonic haustral folds. There is no evidence of free air. Partially visualized intracardiac leads project over the right ventricle and right atrium. IMPRESSION: Prominence of the colonic haustral folds may reflect colitis. No evidence of bowel obstruction. Electronically Signed   By: Dahlia Bailiff M.D.   On: 06/13/2021 10:04    Scheduled Meds:  amLODipine  5 mg Oral Daily   cyanocobalamin  1,000 mcg Subcutaneous Q30 days   heparin injection (subcutaneous)  5,000 Units Subcutaneous Q8H   loratadine  10 mg Oral Daily   methylPREDNISolone (SOLU-MEDROL) injection  40 mg Intravenous Q24H   pantoprazole (PROTONIX) IV  40 mg Intravenous Q24H   Continuous Infusions:  lactated ringers 75 mL/hr at 06/13/21 0019   PRN Meds:.acetaminophen **OR** acetaminophen, albuterol, dicyclomine, hydrALAZINE, melatonin, morphine injection, ondansetron, oxyCODONE   ASSESMENT:   SBO in setting of Crohn's flare.  Suboptimal management of Crohn's PTA.  Using only sulfasalazine PTA at 1 tablet/day rather than the 6 which been prescribed.   Hx and current ileal Crohn's disease.  Chronic loose,  nonbloody stools twice daily.  Day 4 Solu-Medrol.  Quant gold testing pending.  Fecal calprotectin pending.  Hep B and C serologies negative.  C. difficile PCR negative.  Progressive chronic leukopenia, neutropenia, acute pancytopenia.  Leukopenia resolved.  Received 1 dose of Granix.  WBCs up from 1.4..  14.1.  Dr. Narda Rutherford suspects his chronic neutropenia is due to Crohn's disease associated inflammation and possibly the sulfasalazine which is now discontinued.  Anemia.  MCV normal.  Folate and B12 okay.  Has received 2 doses of subcu B12 as  inpatient.  Not clear if this needs to be continued as a monthly injection.   PLAN     Switch to oral prednisone.  Prednisone 40 mg daily x 2 weeks; 30 mg x 2-weeks, 20 mg until he seen in the office.  Do not restart or continue sulfasalazine which has proven ineffective and confounding the issue of his leukopenia/pancytopenia pt prefers to fup w Dr Hilarie Fredrickson, will have office arrange fup with MD or APP and contact pt w info.      Azucena Freed  06/13/2021, 11:59 AM Phone [phone]

## 2021-06-13 NOTE — Care Management Important Message (Signed)
Important Message  Patient Details  Name: Maxwell Li MRN: [id number] Date of Birth: April 19, 1955   Medicare Important Message Given:  Other (see comment)     [name] 06/13/2021, 12:30 PM

## 2021-06-14 ENCOUNTER — Telehealth: Payer: Self-pay

## 2021-06-14 LAB — QUANTIFERON-TB GOLD PLUS (RQFGPL)
QuantiFERON Mitogen Value: 9.52 IU/mL
QuantiFERON Nil Value: 0.02 IU/mL
QuantiFERON TB1 Ag Value: 0.03 IU/mL
QuantiFERON TB2 Ag Value: 0.01 IU/mL

## 2021-06-14 LAB — QUANTIFERON-TB GOLD PLUS: QuantiFERON-TB Gold Plus: NEGATIVE

## 2021-06-14 LAB — METHYLMALONIC ACID, SERUM: Methylmalonic Acid, Quantitative: 186 nmol/L (ref 0–378)

## 2021-06-14 NOTE — Telephone Encounter (Signed)
Patient has been scheduled for a follow up appointment w/ Jaclyn Shaggy on Fri,. 6-30 at 9:15 am. MyChart message sent to patient with appointment information.

## 2021-06-14 NOTE — Telephone Encounter (Signed)
-----   Message from Vena Rua, PA-C sent at 06/13/2021 12:26 PM EDT ----- Regarding: follow up appt Hello.  Hope all is well.  Can you make appt for this pt to fup w JP or APP in about 3 to 4 weeks.  Pt has had SBO due to flare of ileal Crohns, sbo resolved w steroids and going home on prednisone.  Had initially seen GI Dr. Kinnie Feil and in [facility] some years ago when he was diagnosed but did not follow-up with him due to insurance.  Was letting his PCP manage his Crohn's and was on suboptimal medication. In addition to being seen by Arthor Captain did the initial consult so if she has an opening she would be best choice for APP follow-up. Thank you

## 2021-06-15 ENCOUNTER — Telehealth: Payer: Self-pay | Admitting: Hematology and Oncology

## 2021-06-15 NOTE — Telephone Encounter (Signed)
Called and spoke to patient.  Confirmed appointment with Jaclyn Shaggy on Friday, 6-30 at 9:15 am. Patient advised to come to the 3rd floor of our building.

## 2021-06-15 NOTE — Telephone Encounter (Signed)
Scheduled appt per 6/7 staff msg from Dr. Lorenso Courier. Pt is aware of appt date and time.

## 2021-06-20 LAB — CALPROTECTIN, FECAL: Calprotectin, Fecal: 8 ug/g (ref 0–120)

## 2021-06-21 ENCOUNTER — Ambulatory Visit (INDEPENDENT_AMBULATORY_CARE_PROVIDER_SITE_OTHER): Payer: Medicare HMO

## 2021-06-21 DIAGNOSIS — I442 Atrioventricular block, complete: Secondary | ICD-10-CM | POA: Diagnosis not present

## 2021-06-23 LAB — CUP PACEART REMOTE DEVICE CHECK
Battery Remaining Longevity: 64 mo
Battery Remaining Percentage: 52 %
Battery Voltage: 2.99 V
Brady Statistic AP VP Percent: 1 %
Brady Statistic AP VS Percent: 2.5 %
Brady Statistic AS VP Percent: 1.5 %
Brady Statistic AS VS Percent: 96 %
Brady Statistic RA Percent Paced: 2.4 %
Brady Statistic RV Percent Paced: 1.7 %
Date Time Interrogation Session: 20230614020015
Implantable Lead Implant Date: 20170601
Implantable Lead Implant Date: 20170601
Implantable Lead Location: 753859
Implantable Lead Location: 753860
Implantable Pulse Generator Implant Date: 20170601
Lead Channel Impedance Value: 480 Ohm
Lead Channel Impedance Value: 490 Ohm
Lead Channel Pacing Threshold Amplitude: 0.625 V
Lead Channel Pacing Threshold Amplitude: 0.75 V
Lead Channel Pacing Threshold Pulse Width: 0.5 ms
Lead Channel Pacing Threshold Pulse Width: 0.5 ms
Lead Channel Sensing Intrinsic Amplitude: 3.1 mV
Lead Channel Sensing Intrinsic Amplitude: 6.7 mV
Lead Channel Setting Pacing Amplitude: 1.625
Lead Channel Setting Pacing Amplitude: 2.5 V
Lead Channel Setting Pacing Pulse Width: 0.5 ms
Lead Channel Setting Sensing Sensitivity: 2 mV
Pulse Gen Model: 2272
Pulse Gen Serial Number: 7904591

## 2021-06-27 ENCOUNTER — Telehealth: Payer: Self-pay | Admitting: Gastroenterology

## 2021-06-27 ENCOUNTER — Telehealth: Payer: Self-pay

## 2021-06-27 NOTE — Telephone Encounter (Signed)
Inbound call from patient stating he needs prescription for B-12 shots. Please advise.   Would like it to be sent to [facility] in [place].   Pharmacy number: [id number]

## 2021-06-27 NOTE — Telephone Encounter (Signed)
Left message for pt call back:

## 2021-06-27 NOTE — Telephone Encounter (Signed)
T/C from pt asking about a prescription for Vitamin B12 injections and if we can send a prescription since the one from Dr Candiss Norse did not get processed.  Rx written for pt to start on 07/12/21  Pt advised he is not an established pt and he would need to contact his primary care Dr for any follow up regarding B12 injections. He is scheduled for New pt visit on 7/7

## 2021-06-27 NOTE — Telephone Encounter (Signed)
Pt saw Jaclyn Shaggy- never seen Dr Rush Landmark that I can tell.

## 2021-06-28 NOTE — Telephone Encounter (Signed)
Pt stated that he realized that he called the wrong office yesterday and that this has been resolved:  Pt stated that he is going to receive the B12 at his PCP Pt verbalized understanding with all questions answered.

## 2021-07-07 ENCOUNTER — Ambulatory Visit (INDEPENDENT_AMBULATORY_CARE_PROVIDER_SITE_OTHER): Payer: Medicare HMO | Admitting: Nurse Practitioner

## 2021-07-07 ENCOUNTER — Other Ambulatory Visit: Payer: Self-pay

## 2021-07-07 ENCOUNTER — Encounter: Payer: Self-pay | Admitting: Nurse Practitioner

## 2021-07-07 ENCOUNTER — Other Ambulatory Visit (INDEPENDENT_AMBULATORY_CARE_PROVIDER_SITE_OTHER): Payer: Medicare HMO

## 2021-07-07 ENCOUNTER — Telehealth: Payer: Self-pay

## 2021-07-07 DIAGNOSIS — K50919 Crohn's disease, unspecified, with unspecified complications: Secondary | ICD-10-CM

## 2021-07-07 DIAGNOSIS — D509 Iron deficiency anemia, unspecified: Secondary | ICD-10-CM

## 2021-07-07 DIAGNOSIS — E875 Hyperkalemia: Secondary | ICD-10-CM

## 2021-07-07 LAB — CBC
HCT: 43.9 % (ref 39.0–52.0)
Hemoglobin: 14.4 g/dL (ref 13.0–17.0)
MCHC: 32.9 g/dL (ref 30.0–36.0)
MCV: 98.2 fl (ref 78.0–100.0)
Platelets: 124 10*3/uL — ABNORMAL LOW (ref 150.0–400.0)
RBC: 4.47 Mil/uL (ref 4.22–5.81)
RDW: 18.3 % — ABNORMAL HIGH (ref 11.5–15.5)
WBC: 5.1 10*3/uL (ref 4.0–10.5)

## 2021-07-07 LAB — BASIC METABOLIC PANEL
BUN: 13 mg/dL (ref 6–23)
CO2: 36 mEq/L — ABNORMAL HIGH (ref 19–32)
Calcium: 9.9 mg/dL (ref 8.4–10.5)
Chloride: 91 mEq/L — ABNORMAL LOW (ref 96–112)
Creatinine, Ser: 0.74 mg/dL (ref 0.40–1.50)
GFR: 94.68 mL/min (ref >60.00)
Glucose, Bld: 124 mg/dL — ABNORMAL HIGH (ref 70–99)
Potassium: 5.6 mEq/L — ABNORMAL HIGH (ref 3.5–5.1)
Sodium: 134 mEq/L — ABNORMAL LOW (ref 135–145)

## 2021-07-07 LAB — IBC + FERRITIN
Ferritin: 699 ng/mL — ABNORMAL HIGH (ref 22.0–322.0)
Iron: 335 ug/dL — ABNORMAL HIGH (ref 42–165)
Saturation Ratios: 89.3 % — ABNORMAL HIGH (ref 20.0–50.0)
TIBC: 375.2 ug/dL (ref 250.0–450.0)
Transferrin: 268 mg/dL (ref 212.0–360.0)

## 2021-07-07 MED ORDER — NA SULFATE-K SULFATE-MG SULF 17.5-3.13-1.6 GM/177ML PO SOLN: 1.0000 | ORAL | 0 refills | Status: DC

## 2021-07-07 MED ORDER — PREDNISONE 10 MG PO TABS: ORAL_TABLET | ORAL | 0 refills | Status: DC

## 2021-07-07 MED ORDER — OMEPRAZOLE 20 MG PO CPDR: 20.0000 mg | DELAYED_RELEASE_CAPSULE | Freq: Every day | ORAL | 1 refills | Status: DC

## 2021-07-07 NOTE — Patient Instructions (Addendum)
1) Continue Prednisone taper as prescribed  2) Contact our office if you develop abdominal pain, nausea/vomiting or significant diarrhea    If you are age 66 or older, your body mass index should be between 23-30. Your Body mass index is 29.76 kg/m. If this is out of the aforementioned range listed, please consider follow up with your Primary Care Provider. ________________________________________________________  The  GI providers would like to encourage you to use [facility] to communicate with providers for non-urgent requests or questions.  Due to long hold times on the telephone, sending your provider a message by [facility] may be a faster and more efficient way to get a response.  Please allow 48 business hours for a response.  Please remember that this is for non-urgent requests.  _______________________________________________________  Your provider has requested that you go to the basement level for lab work before leaving today. Press "B" on the elevator. The lab is located at the first door on the left as you exit the elevator.  You have been scheduled for an endoscopy and colonoscopy. Please follow the written instructions given to you at your visit today. Please pick up your prep supplies at the pharmacy within the next 1-3 days. If you use inhalers (even only as needed), please bring them with you on the day of your procedure.  Due to recent changes in healthcare laws, you may see the results of your imaging and laboratory studies on MyChart before your provider has had a chance to review them.  We understand that in some cases there may be results that are confusing or concerning to you. Not all laboratory results come back in the same time frame and the provider may be waiting for multiple results in order to interpret others.  Please give [place] 48 hours in order for your provider to thoroughly review all the results before contacting the office for clarification of your results.    Thank you for entrusting me with your care and choosing [facility].  Carl Best, NP

## 2021-07-07 NOTE — Progress Notes (Signed)
Addendum: Reviewed and agree with assessment and management plan. Can complete taper to stop pred completely as outlined. He will then have EGD/colonoscopy and we will need to discuss chronic, maintenance therapies Aarianna Hoadley, Lajuan Lines, MD

## 2021-07-07 NOTE — Telephone Encounter (Signed)
Patient called in wanting a appointment since he has not been seen in a while. Patient does not have any symptoms

## 2021-07-07 NOTE — Progress Notes (Signed)
07/07/2021 JAGDEEP ANCHETA [id number] Dec 25, 1955   Chief Complaint: Crohn's disease, hospital follow up  History of Present Illness: Maxwell Li is a 66 y.o. male with a past medical history of hypertension, hyperlipidemia, "cardiac arrest" with complete heart block s/p pacemaker 06/2015, leukopenia, asthma obstructive sleep apnea on CPAP GERD, hyperplastic rectal polyps and Crohn's disease previously treated with Sulfasalazine. S/P appendectomy in the 1960s.  He was initially diagnosed with Crohn's disease in 2015 ago by Dr. Chip Boer in [facility].  He underwent an EGD and colonoscopy 07/27/2013.  The EGD identified several antral erosions likely due to Ibuprofen use and an inlet patch in the proximal esophagus.  Duodenal biopsies showed evidence of chronic active duodenitis without evidence of celiac disease. Gastric biopsies showed chronic active gastritis without evidence of H. pylori.  Esophageal biopsies were suggestive of reflux esophagitis.  The colonoscopy identified grainy erosions with some ileal erythema, ileocecal valve erythema with erosions, transverse colon with erosions and 2 diminutive rectal polyps were removed.  Biopsies of the transverse colon showed benign mucosa with mild acute colitis, biopsies of the ileum showed benign ileocolonic mucosa showing patchy acute enterocolitis (these findings were nonspecific and were possibly due to NSAID use but Crohn's colitis could not be excluded),  the rectal polyps were hyperplastic.  It was thought he likely had Crohn's disease.  He was advised to avoid NSAIDs and was prescribed Budesonide which he took for 1 month then was switched to Sulfasalazine. He followed up with Dr. Chip Boer once or twice post procedures the never went back.  Since then, his PCP continued to prescribe Sulfasalazine which was discontinued during his recent hospitalization.  He was admitted to the hospital 06/10/2021 with N/V/D, right sided abdominal pain  secondary to Crohn's enteritis and a partial SBO. Labs in the ED showed a WBC count of 3.3.  Hemoglobin 14.6.  Hematocrit 43.6.  Platelet 129.  Sodium 135. Potassium 3.9.  BUN 14.  Creatinine 0.84.  Total bili 1.0.  Alk phos 59.  AST 18.  ALT 12.  Lipase 27.  CTAP showed a SBO with a transition point in the mid ileum, distal to the transition point the small bowel is decompressed with associated regions of wall thickening and mucosal enhancement consistent with a Crohn's flare.  Fluid in the rectum and distal sigmoid colon with associated mucosal enhancement suggestive active colitis.  He received Solu-Medrol IV x 2 doses and his WBC count dropped from 3.3-1.4 with ANC 0.8.  He was evaluated by hematologist Dr. Narda Rutherford on he received Granix and Solu-Medrol was resumed within 24 hours.  He had a positive C. difficile antigen but negative toxin and PCR which was consistent with C. difficile colonization therefore Vancomycin was not warranted.  Overall, his clinical status stabilized.  He continued to have loose stools and his diet was advanced without nausea or vomiting.  He was discharged home 06/13/2021 on a Prednisone 75m taper with plans to schedule an EGD and colonoscopy as an outpatient.  Currently, he feels well.  He took Prednisone 40 mg daily for 2 weeks then down to 339mdaily, he is about one week on Prednisone 3071maily with instructions to reduce to 38m81m 2 weeks.  He requires further Prednisone instructions and a refill so he does not run short of his supply.  His energy level has significantly improved on Prednisone.  His abdominal pain abated prior to being discharged from the hospital without recurrence.  He has had mild  heartburn a few times over the past few weeks.  He is not taking any acid reducing medications.  He is passing 1-2 soft to loose stools daily.  No rectal bleeding or black stools.  He is tolerating a fairly regular diet.  No NSAIDs.  He is taking melatonin Gummies for  sleep.  Lab 06/11/2021: QuantiFERON gold negative.  Hepatitis B surface antigen nonreactive.  Hepatitis B surface antibody nonreactive.  Hepatitis B core total antibody nonreactive.  Hepatitis C antibody nonreactive.      Latest Ref Rng & Units 06/13/2021    1:39 AM 06/12/2021   12:55 AM 06/11/2021    1:30 AM  CBC  WBC 4.0 - 10.5 K/uL 14.0  14.1  1.4   Hemoglobin 13.0 - 17.0 g/dL 10.7  12.0  11.9   Hematocrit 39.0 - 52.0 % 31.5  35.1  34.8   Platelets 150 - 400 K/uL 103  109  107          Latest Ref Rng & Units 06/13/2021    1:39 AM 06/12/2021   12:55 AM 06/11/2021    1:30 AM  CMP  Glucose 70 - 99 mg/dL 124  125  126   BUN 8 - 23 mg/dL 9  9  10    Creatinine 0.61 - 1.24 mg/dL 0.60  0.61  0.56   Sodium 135 - 145 mmol/L 132  133  130   Potassium 3.5 - 5.1 mmol/L 4.0  4.5  4.3   Chloride 98 - 111 mmol/L 97  94  96   CO2 22 - 32 mmol/L 31  28  27    Calcium 8.9 - 10.3 mg/dL 8.3  8.9  8.4   Total Protein 6.5 - 8.1 g/dL 5.7  6.6    Total Bilirubin 0.3 - 1.2 mg/dL 0.4  0.8    Alkaline Phos 38 - 126 U/L 50  45    AST 15 - 41 U/L 21  19    ALT 0 - 44 U/L 12  11      Labs at week 46/12/2021: WBC 3.4.  Hemoglobin 13.3.  Hematocrit 39.2.  Platelet 142.  B12 level 443.  Current Outpatient Medications on File Prior to Visit  Medication Sig Dispense Refill   albuterol (VENTOLIN HFA) 108 (90 Base) MCG/ACT inhaler Inhale 2 puffs into the lungs every 6 (six) hours as needed for wheezing or shortness of breath.     amLODipine (NORVASC) 5 MG tablet Take 5 mg by mouth daily.     Cholecalciferol (VITAMIN D-3) 125 MCG (5000 UT) TABS Take 1 tablet by mouth daily at 2 PM.     [START ON 07/12/2021] cyanocobalamin (,VITAMIN B-12,) 1000 MCG/ML injection Inject 1 mL (1,000 mcg total) into the skin every 30 (thirty) days. At PCP office 1 mL 0   metoprolol tartrate (LOPRESSOR) 25 MG tablet Take 1 tablet (25 mg total) by mouth 2 (two) times daily. 180 tablet 3   PRESCRIPTION MEDICATION Inhale into the lungs at bedtime.  CPAP     testosterone cypionate (DEPOTESTOTERONE CYPIONATE) 200 MG/ML injection Inject 1 mL (200 mg total) into the muscle every 14 (fourteen) days. 10 mL 0   No current facility-administered medications on file prior to visit.   Allergies  Allergen Reactions   Lidocaine Swelling    Throat swelling    Lovastatin     REACTION: Nausea   Shellfish Allergy Itching   Current Medications, Allergies, Past Medical History, Past Surgical History, Family History and Social History were  reviewed in [facility] record.  Review of Systems:   Constitutional: Negative for fever, sweats, chills or weight loss.  Respiratory: Negative for shortness of breath.   Cardiovascular: Negative for chest pain, palpitations and leg swelling.  Gastrointestinal: See HPI.  Musculoskeletal: Negative for back pain or muscle aches.  Neurological: Negative for dizziness, headaches or paresthesias.   Physical Exam: BP 122/70   Pulse 80   Ht 5' 3"  (1.6 m)   Wt 168 lb (76.2 kg)   BMI 29.76 kg/m   General: 66 year old male in no acute distress. Head: Normocephalic and atraumatic.  Facial rosacea. Eyes: No scleral icterus. Conjunctiva pink . Ears: Normal auditory acuity. Mouth: Dentition intact. No ulcers or lesions.  Lungs: Clear throughout to auscultation. Heart: Regular rate and rhythm, no murmur. Abdomen: Soft, nontender and nondistended. No masses or hepatomegaly. Normal bowel sounds x 4 quadrants.  Rectal: Deferred. Musculoskeletal: Symmetrical with no gross deformities. Extremities: No edema. Neurological: Alert oriented x 4. No focal deficits.  Psychological: Alert and cooperative. Normal mood and affect  Assessment and Recommendations:  76) 66 year old male with ileal Crohn's disease initially diagnosed in 2015 who was admitted to the hospital 6/3 - 06/13/2021 with N/V/D, RUQ and RLQ pain. CTAP identified a SBO with a transition point in the mid ileum, distal to the transition  point the small bowel is decompressed with associated regions of wall thickening and mucosal enhancement consistent with a Crohn's flare.  Fluid in the rectum and distal sigmoid colon with associated mucosal enhancement suggestive of active colitis. His partial SBO and abdominal pain abated following IV Solu-Medrol.  He was discharged home on Prednisone 71m QD x 2 weeks, he is currently on 2nd week of Prednisone 360mQD. Hep B surface antigen, Hep C antibody and Quantiferon Gold levels are negative (collected in anticipation for future biologic therapy for Crohn's disease).   -Continue Prednisone taper, Prednisone 3083mo daily x 7 more days then Prednisone 20 mg x 14 days then Prednisone 10 mg daily x14 days -EGD and colonoscopy benefits and risks discussed including risk with sedation, risk of bleeding, perforation and infection  -Further recommendations including possible future biologic therapy to be determined after EGD and colonoscopy completed -Patient to contact her office if he develops any nausea/vomiting, abdominal pain or frequent loose stools   2) History of chronic leukopenia likely due to chronic inflammation secondary to Crohn's disease and Sulfasalazine with acute leukopenia after Solumedrol 12m93m x 2 doses with neutropenia. WBC 3.3 -> WBC 1.4. ANC 0.8. Seen by hematologist Dr. DorsLorenso Couriering his recent hospitalization, received Granix x 1 dose -> WBC 14.1. Solumedrol was resumed, switched to Prednisone at time of discharge -Remain off Sulfasalazine indefinitely  3) Anemia secondary to Crohn's disease.  Labs 06/19/2021: WBC 3.4.  Hemoglobin 13.3.  Hematocrit 39.2.   -CBC, IBC and ferritin  4) Thrombocytopenia, improving. Platelet 142  5) GERD -I advised the patient to take Omeprazole 20 mg daily as he is recently had a few episodes of minor heartburn and he is at risk for gastritis while on Prednisone.  Patient to continue Omeprazole 20 mg once daily for as long as he is on  Prednisone.  Further PPI instructions to be determined after EGD completed.  6) History of rectal hyperplastic's per colonoscopy in 2015 -Colonoscopy as ordered above   7) Hepatic steatosis per CT. Normal LFTs.

## 2021-07-10 ENCOUNTER — Other Ambulatory Visit (INDEPENDENT_AMBULATORY_CARE_PROVIDER_SITE_OTHER): Payer: Medicare HMO

## 2021-07-10 DIAGNOSIS — E875 Hyperkalemia: Secondary | ICD-10-CM

## 2021-07-10 DIAGNOSIS — K50919 Crohn's disease, unspecified, with unspecified complications: Secondary | ICD-10-CM | POA: Diagnosis not present

## 2021-07-10 LAB — BASIC METABOLIC PANEL
BUN: 24 mg/dL — ABNORMAL HIGH (ref 6–23)
CO2: 30 mEq/L (ref 19–32)
Calcium: 9.2 mg/dL (ref 8.4–10.5)
Chloride: 90 mEq/L — ABNORMAL LOW (ref 96–112)
Creatinine, Ser: 0.77 mg/dL (ref 0.40–1.50)
GFR: 93.54 mL/min (ref >60.00)
Glucose, Bld: 132 mg/dL — ABNORMAL HIGH (ref 70–99)
Potassium: 4.4 mEq/L (ref 3.5–5.1)
Sodium: 132 mEq/L — ABNORMAL LOW (ref 135–145)

## 2021-07-14 ENCOUNTER — Other Ambulatory Visit: Payer: Self-pay | Admitting: Hematology and Oncology

## 2021-07-14 ENCOUNTER — Inpatient Hospital Stay: Payer: Medicare HMO

## 2021-07-14 ENCOUNTER — Inpatient Hospital Stay: Payer: Medicare HMO | Attending: Hematology and Oncology | Admitting: Hematology and Oncology

## 2021-07-14 ENCOUNTER — Other Ambulatory Visit: Payer: Self-pay

## 2021-07-14 VITALS — BP 138/85 | HR 80 | Temp 97.8°F | Resp 16 | Wt 163.1 lb

## 2021-07-14 DIAGNOSIS — D72819 Decreased white blood cell count, unspecified: Secondary | ICD-10-CM | POA: Diagnosis present

## 2021-07-14 DIAGNOSIS — D708 Other neutropenia: Secondary | ICD-10-CM

## 2021-07-14 DIAGNOSIS — D696 Thrombocytopenia, unspecified: Secondary | ICD-10-CM | POA: Insufficient documentation

## 2021-07-14 DIAGNOSIS — Z800 Family history of malignant neoplasm of digestive organs: Secondary | ICD-10-CM | POA: Insufficient documentation

## 2021-07-14 DIAGNOSIS — Z801 Family history of malignant neoplasm of trachea, bronchus and lung: Secondary | ICD-10-CM | POA: Diagnosis not present

## 2021-07-14 DIAGNOSIS — K50112 Crohn's disease of large intestine with intestinal obstruction: Secondary | ICD-10-CM

## 2021-07-14 DIAGNOSIS — Z87891 Personal history of nicotine dependence: Secondary | ICD-10-CM | POA: Diagnosis not present

## 2021-07-14 LAB — VITAMIN B12: Vitamin B-12: 1688 pg/mL — ABNORMAL HIGH (ref 180–914)

## 2021-07-14 LAB — CBC WITH DIFFERENTIAL (CANCER CENTER ONLY)
Abs Immature Granulocytes: 0.15 10*3/uL — ABNORMAL HIGH (ref 0.00–0.07)
Basophils Absolute: 0 10*3/uL (ref 0.0–0.1)
Basophils Relative: 0 %
Eosinophils Absolute: 0 10*3/uL (ref 0.0–0.5)
Eosinophils Relative: 0 %
HCT: 44.5 % (ref 39.0–52.0)
Hemoglobin: 15.3 g/dL (ref 13.0–17.0)
Immature Granulocytes: 5 %
Lymphocytes Relative: 16 %
Lymphs Abs: 0.5 10*3/uL — ABNORMAL LOW (ref 0.7–4.0)
MCH: 33.3 pg (ref 26.0–34.0)
MCHC: 34.4 g/dL (ref 30.0–36.0)
MCV: 96.9 fL (ref 80.0–100.0)
Monocytes Absolute: 0.3 10*3/uL (ref 0.1–1.0)
Monocytes Relative: 11 %
Neutro Abs: 1.9 10*3/uL (ref 1.7–7.7)
Neutrophils Relative %: 68 %
Platelet Count: 122 10*3/uL — ABNORMAL LOW (ref 150–400)
RBC: 4.59 MIL/uL (ref 4.22–5.81)
RDW: 20.7 % — ABNORMAL HIGH (ref 11.5–15.5)
WBC Count: 2.8 10*3/uL — ABNORMAL LOW (ref 4.0–10.5)
nRBC: 0 % (ref 0.0–0.2)

## 2021-07-14 LAB — CMP (CANCER CENTER ONLY)
ALT: 19 U/L (ref 0–44)
AST: 14 U/L — ABNORMAL LOW (ref 15–41)
Albumin: 4.3 g/dL (ref 3.5–5.0)
Alkaline Phosphatase: 60 U/L (ref 38–126)
Anion gap: 6 (ref 5–15)
BUN: 25 mg/dL — ABNORMAL HIGH (ref 8–23)
CO2: 35 mmol/L — ABNORMAL HIGH (ref 22–32)
Calcium: 9.7 mg/dL (ref 8.9–10.3)
Chloride: 92 mmol/L — ABNORMAL LOW (ref 98–111)
Creatinine: 0.76 mg/dL (ref 0.61–1.24)
GFR, Estimated: >60 mL/min (ref >60)
Glucose, Bld: 123 mg/dL — ABNORMAL HIGH (ref 70–99)
Potassium: 4.2 mmol/L (ref 3.5–5.1)
Sodium: 133 mmol/L — ABNORMAL LOW (ref 135–145)
Total Bilirubin: 0.9 mg/dL (ref 0.3–1.2)
Total Protein: 7.2 g/dL (ref 6.5–8.1)

## 2021-07-17 NOTE — Progress Notes (Deleted)
Cardiology Office Note Date:  07/17/2021  Patient ID:  Maxwell Li, DOB 1955/10/04, MRN [id number] PCP:  System, Provider Not In  Cardiologist:  Dr. Percival Spanish Electrophysiologist: Dr. Curt Bears    Chief Complaint: *** over due visit  History of Present Illness: Maxwell Li is a 66 y.o. male with history of HTN, HLD, GERD, hypogonadism, advanced heart block w/PPM, asthma, OSA w/CPAP, GERD, Chron's   He comes in today to be seen for Dr. Curt Bears, last seen by him in Sept 2017, at that time doing well, had 1:1 AT episodes that he was asymptomatic with, no changes were made to his tx.  He was cleared for a knee surgery in Feb 2018 by Dr. Percival Spanish.  I saw him 02/08/2017 He is accompanied by his daughter.  He is doing well.  No CP, palpitations, no near syncoe or syncope, no SOB or exertional intolerances.  He c/w a movement type dizziness, particularly when laying on his R side or with quick movents of his head.  He reports being told historically that he had vertigo. He had some AT episodes and his BP elevated, lopressor was increased  No visits since then.  hospital 06/10/2021 with N/V/D, right sided abdominal pain secondary to Crohn's enteritis and a partial SBO Follows with GI and oncology (for leukopenia)  + remotes  *** symptoms, cardiac *** AT burden? *** BP, ? PMD  Device information: SJM dual chamber PPM, implanted6/1/17   Past Medical History:  Diagnosis Date   Crohn's disease ([place])    DYSLIPIDEMIA 06/28/2009   Qualifier: Diagnosis of  By: Loanne Drilling MD, Sean A    GERD 09/04/2006   Qualifier: Diagnosis of  By: Loanne Drilling MD, Sean A    Hypertension    HYPOGONADISM, MALE 05/20/2008   Qualifier: Diagnosis of  By: Loanne Drilling MD, Sean A    KNEE PAIN 03/24/2007   Qualifier: Diagnosis of  By: Loanne Drilling MD, Sean A    Ventral hernia     Past Surgical History:  Procedure Laterality Date   APPENDECTOMY     ELECTROCARDIOGRAM  02/01/2006   EP IMPLANTABLE DEVICE N/A  06/09/2015   Procedure: Pacemaker Implant;  Surgeon: Will Meredith Leeds, MD;  Location: [facility];  Service: Cardiovascular;  Laterality: N/A;   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     amLODipine (NORVASC) 5 MG tablet Take 5 mg by mouth daily.     Cholecalciferol (VITAMIN D-3) 125 MCG (5000 UT) TABS Take 1 tablet by mouth daily at 2 PM.     cyanocobalamin (,VITAMIN B-12,) 1000 MCG/ML injection Inject 1 mL (1,000 mcg total) into the skin every 30 (thirty) days. At PCP office 1 mL 0   metoprolol tartrate (LOPRESSOR) 25 MG tablet Take 1 tablet (25 mg total) by mouth 2 (two) times daily. 180 tablet 3   Na Sulfate-K Sulfate-Mg Sulf (SUPREP BOWEL PREP KIT) 17.5-3.13-1.6 GM/177ML SOLN Take 1 kit by mouth as directed. 324 mL 0   omeprazole (PRILOSEC) 20 MG capsule Take 1 capsule (20 mg total) by mouth daily. 90 capsule 1   predniSONE (DELTASONE) 10 MG tablet Take Prednisone 82m tab 3 tabs po once daily x 1 week then 2 tabs po once daily x 14  days then 1 tab po once daily x 14 days 63 tablet 0   PRESCRIPTION MEDICATION Inhale into the lungs at bedtime. CPAP     testosterone cypionate (DEPOTESTOTERONE CYPIONATE) 200 MG/ML injection Inject 1 mL (200 mg total) into the muscle every 14 (fourteen) days. 10 mL 0   No current facility-administered medications for this visit.    Allergies:   Lidocaine, Lovastatin, and Shellfish allergy   Social History:  The patient  reports that he has quit smoking. His smoking use included cigarettes. He has never used smokeless tobacco. He reports that he does not drink alcohol and does not use drugs.   Family History:  The patient's family history includes Cancer in his brother; Heart attack in his brother;  Stroke in his father.  ROS:  Please see the history of present illness.   All other systems are reviewed and otherwise negative.   PHYSICAL EXAM:  VS:  There were no vitals taken for this visit. BMI: There is no height or weight on file to calculate BMI. Well nourished, well developed, in no acute distress  HEENT: normocephalic, atraumatic  Neck: no JVD, carotid bruits or masses Cardiac:  *** RRR; no significant murmurs, no rubs, or gallops Lungs:  *** CTA b/l, no wheezing, rhonchi or rales  Abd: soft, non-tender, obese MS: no deformity or  atrophy Ext:  *** no edema  Skin: warm and dry, no rash Neuro:  No gross deficits appreciated Psych: euthymic mood, full affect  *** PPM site is stable, no tethering or discomfort   EKG:  done today and reviewed by myself ***  PPM interrogation done today and reviewed by myself:  ***  06/09/15: TTE Study Conclusions - Left ventricle: Abnormal septal motion The cavity size was   normal. Wall thickness was normal. Systolic function was normal.   The estimated ejection fraction was in the range of 50% to 55%.   Wall motion was normal; there were no regional wall motion   abnormalities. Left ventricular diastolic function parameters   were normal. - Atrial septum: A patent foramen ovale cannot be excluded.    Recent Labs: 06/13/2021: B Natriuretic Peptide 54.7; Magnesium 2.1 07/14/2021: ALT 19; BUN 25; Creatinine 0.76; Hemoglobin 15.3; Platelet Count 122; Potassium 4.2; Sodium 133  No results found for requested labs within last 365 days.   Estimated Creatinine Clearance: 81.8 mL/min (by C-G formula based on SCr of 0.76 mg/dL).   Wt Readings from Last 3 Encounters:  07/14/21 163 lb 1.6 oz (74 kg)  07/07/21 168 lb (76.2 kg)  06/12/21 168 lb 14 oz (76.6 kg)     Other studies reviewed: Additional studies/records reviewed today include: summarized above  ASSESSMENT AND PLAN:  1. PPM     *** Intact function     *** no programming  changes made  2. HTN    ***  3. HLD     *** He follows with his PMD  4. AT noted     ***    Disposition: ***   Current medicines are reviewed at length with the patient today.  The patient did not have any concerns regarding medicines.  Venetia Night, PA-C 07/17/2021 10:39 AM     [facility] [place]  [address] [phone] (office)  [phone] (fax)

## 2021-07-20 ENCOUNTER — Encounter: Payer: Medicare HMO | Admitting: Physician Assistant

## 2021-07-23 NOTE — Progress Notes (Signed)
[place] [phone]   [phone]  PROGRESS NOTE  Patient Care Team: System, Provider Not In as PCP - General  Hematological/Oncological History # Neutropenia, Chronic 03/15/2017: white blood cell count of 2.9 with an ANC of 1200 06/10/2021: white blood cell count of 1.4 with hemoglobin of 11.9 and platelets of 107.  His [place] dropped down to 800. 06/12/2021: WBC 14.1 after 1 dose of GCSF and steroid therapy 07/14/2021: WBC 2.8, Hgb 15.3, MCV 96.9, Plt 122. ANC 1900  Interval History:  Maxwell Li 66 y.o. male with medical history significant for chronic leukopenia who presents for a follow up visit. The patient's last visit was on 06/13/2021 while he was in the hospital with a Crohn's flare. In the interim since the last visit his white blood cell count normalized before discharge but has once again declined.  On exam today Maxwell Li notes that his intestinal problems are considerably better.  He reports that he is having regular bowel movements with no spasms or pain.  He is currently in the "weaning off stage" of his prednisone.  He reports that he is currently taking 2 pills and will be on 1 pill for subsequent week.  He notes that he continues to get his vitamin B12 shots and does this monthly with his primary care provider.  He reports that his weight is down some to 163 pounds from 168 pounds when we last saw him.  He notes his energy is good and his appetite is strong.  He otherwise denies any fevers, chills, sweats, nausea, vomiting or diarrhea.  A full 10 point ROS is listed below.  MEDICAL HISTORY:  Past Medical History:  Diagnosis Date   Crohn's disease ([place])    DYSLIPIDEMIA 06/28/2009   Qualifier: Diagnosis of  By: Loanne Drilling MD, Sean A    GERD 09/04/2006   Qualifier: Diagnosis of  By: Loanne Drilling MD, Sean A    Hypertension    HYPOGONADISM, MALE 05/20/2008   Qualifier: Diagnosis of  By: Loanne Drilling MD, Sean A    KNEE PAIN 03/24/2007    Qualifier: Diagnosis of  By: Loanne Drilling MD, Sean A    Ventral hernia     SURGICAL HISTORY: Past Surgical History:  Procedure Laterality Date   APPENDECTOMY     ELECTROCARDIOGRAM  02/01/2006   EP IMPLANTABLE DEVICE N/A 06/09/2015   Procedure: Pacemaker Implant;  Surgeon: Will Meredith Leeds, MD;  Location: [facility];  Service: Cardiovascular;  Laterality: N/A;   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   Left   Rest Cardiolite  11/21/2000    SOCIAL HISTORY: Social History   Socioeconomic History   Marital status: Divorced    Spouse name: Not on file   Number of children: Not on file   Years of education: Not on file   Highest education level: Not on file  Occupational History   Occupation: Pastor  Tobacco Use   Smoking status: Former    Types: Cigarettes   Smokeless tobacco: Never  Vaping Use   Vaping Use: Never used  Substance and Sexual Activity   Alcohol use: No   Drug use: No   Sexual activity: Not on file  Other Topics Concern   Not on file  Social History Narrative   Not on file   Social Determinants of Health   Financial Resource Strain: Not on file  Food Insecurity: Not on file  Transportation Needs: Not on file  Physical Activity: Not on file  Stress: Not on  file  Social Connections: Not on file  Intimate Partner Violence: Not on file    FAMILY HISTORY: Family History  Problem Relation Age of Onset   Stroke Father    Cancer Brother        Lung Cancer   Heart attack Brother    Colon cancer Neg Hx    Esophageal cancer Neg Hx    Pancreatic cancer Neg Hx    Stomach cancer Neg Hx     ALLERGIES:  is allergic to lidocaine, lovastatin, and shellfish allergy.  MEDICATIONS:  Current Outpatient Medications  Medication Sig Dispense Refill   albuterol (VENTOLIN HFA) 108 (90 Base) MCG/ACT inhaler Inhale 2 puffs into the lungs every 6 (six) hours as needed for wheezing or shortness of breath.     amLODipine (NORVASC) 5 MG tablet Take 5 mg by mouth daily.      Cholecalciferol (VITAMIN D-3) 125 MCG (5000 UT) TABS Take 1 tablet by mouth daily at 2 PM.     cyanocobalamin (,VITAMIN B-12,) 1000 MCG/ML injection Inject 1 mL (1,000 mcg total) into the skin every 30 (thirty) days. At PCP office 1 mL 0   metoprolol tartrate (LOPRESSOR) 25 MG tablet Take 1 tablet (25 mg total) by mouth 2 (two) times daily. 180 tablet 3   Na Sulfate-K Sulfate-Mg Sulf (SUPREP BOWEL PREP KIT) 17.5-3.13-1.6 GM/177ML SOLN Take 1 kit by mouth as directed. 324 mL 0   omeprazole (PRILOSEC) 20 MG capsule Take 1 capsule (20 mg total) by mouth daily. 90 capsule 1   predniSONE (DELTASONE) 10 MG tablet Take Prednisone 40m tab 3 tabs po once daily x 1 week then 2 tabs po once daily x 14 days then 1 tab po once daily x 14 days 63 tablet 0   PRESCRIPTION MEDICATION Inhale into the lungs at bedtime. CPAP     testosterone cypionate (DEPOTESTOTERONE CYPIONATE) 200 MG/ML injection Inject 1 mL (200 mg total) into the muscle every 14 (fourteen) days. 10 mL 0   No current facility-administered medications for this visit.    REVIEW OF SYSTEMS:   Constitutional: ( - ) fevers, ( - )  chills , ( - ) night sweats Eyes: ( - ) blurriness of vision, ( - ) double vision, ( - ) watery eyes Ears, nose, mouth, throat, and face: ( - ) mucositis, ( - ) sore throat Respiratory: ( - ) cough, ( - ) dyspnea, ( - ) wheezes Cardiovascular: ( - ) palpitation, ( - ) chest discomfort, ( - ) lower extremity swelling Gastrointestinal:  ( - ) nausea, ( - ) heartburn, ( - ) change in bowel habits Skin: ( - ) abnormal skin rashes Lymphatics: ( - ) new lymphadenopathy, ( - ) easy bruising Neurological: ( - ) numbness, ( - ) tingling, ( - ) new weaknesses Behavioral/Psych: ( - ) mood change, ( - ) new changes  All other systems were reviewed with the patient and are negative.  PHYSICAL EXAMINATION:  Vitals:   07/14/21 0819  BP: 138/85  Pulse: 80  Resp: 16  Temp: 97.8 F (36.6 C)  SpO2: 93%   Filed Weights    07/14/21 0819  Weight: 163 lb 1.6 oz (74 kg)    GENERAL: Well-appearing elderly Caucasian male, alert, no distress and comfortable SKIN: skin color, texture, turgor are normal, no rashes or significant lesions EYES: conjunctiva are pink and non-injected, sclera clear LUNGS: clear to auscultation and percussion with normal breathing effort HEART: regular rate & rhythm and no  murmurs and no lower extremity edema Musculoskeletal: no cyanosis of digits and no clubbing  PSYCH: alert & oriented x 3, fluent speech NEURO: no focal motor/sensory deficits  LABORATORY DATA:  I have reviewed the data as listed    Latest Ref Rng & Units 07/14/2021    8:48 AM 07/07/2021   10:43 AM 06/13/2021    1:39 AM  CBC  WBC 4.0 - 10.5 K/uL 2.8  5.1  14.0   Hemoglobin 13.0 - 17.0 g/dL 15.3  14.4  10.7   Hematocrit 39.0 - 52.0 % 44.5  43.9  31.5   Platelets 150 - 400 K/uL 122  124.0  103        Latest Ref Rng & Units 07/14/2021    8:48 AM 07/10/2021    8:12 AM 07/07/2021   10:43 AM  CMP  Glucose 70 - 99 mg/dL 123  132  124   BUN 8 - 23 mg/dL 25  24  13    Creatinine 0.61 - 1.24 mg/dL 0.76  0.77  0.74   Sodium 135 - 145 mmol/L 133  132  134   Potassium 3.5 - 5.1 mmol/L 4.2  4.4  5.6 No hemolysis seen   Chloride 98 - 111 mmol/L 92  90  91   CO2 22 - 32 mmol/L 35  30  36   Calcium 8.9 - 10.3 mg/dL 9.7  9.2  9.9   Total Protein 6.5 - 8.1 g/dL 7.2     Total Bilirubin 0.3 - 1.2 mg/dL 0.9     Alkaline Phos 38 - 126 U/L 60     AST 15 - 41 U/L 14     ALT 0 - 44 U/L 19       No results found for: "MPROTEIN" No results found for: "KPAFRELGTCHN", "LAMBDASER", "KAPLAMBRATIO"   RADIOGRAPHIC STUDIES: No results found.  ASSESSMENT & PLAN Stirling Orton Meints 66 y.o. male with medical history significant for chronic leukopenia who presents for a follow up visit.   # Leukopenia, Chronic -- At this time etiology is unclear, though he does have associated thrombocytopenia. --ANC is currently 1900, well above the  neutropenic range --Patient currently undergoing vitamin B12 injections with his primary care provider.  Vitamin B12 levels have increased markedly.  Went from 235 on 06/12/2021 up to 1600 today. --Patient is undergone prior work-up for his thrombocytopenia on 10/02/2018 with Dr. Humphrey Rolls at [facility] --At this time etiology is most likely secondary to the inflammation from his inflammatory bowel disease. --CT scan on 06/10/2021 showed hepatic steatosis but no other evidence of overt liver disease. --Return to clinic on an as-needed basis moving forward.  No orders of the defined types were placed in this encounter.   All questions were answered. The patient knows to call the clinic with any problems, questions or concerns.  A total of more than 30 minutes were spent on this encounter with face-to-face time and non-face-to-face time, including preparing to see the patient, ordering tests and/or medications, counseling the patient and coordination of care as outlined above.   Ledell Peoples, MD Department of Hematology/Oncology [place] [facility] Phone: [phone] Pager: [phone] Email: [email or web address]  07/23/2021 6:19 PM

## 2021-08-09 ENCOUNTER — Telehealth: Payer: Self-pay | Admitting: Nurse Practitioner

## 2021-08-09 ENCOUNTER — Encounter: Payer: Self-pay | Admitting: Internal Medicine

## 2021-08-09 ENCOUNTER — Encounter: Payer: Self-pay | Admitting: Certified Registered Nurse Anesthetist

## 2021-08-09 MED ORDER — NA SULFATE-K SULFATE-MG SULF 17.5-3.13-1.6 GM/177ML PO SOLN: 1.0000 | ORAL | 0 refills | Status: DC

## 2021-08-09 NOTE — Telephone Encounter (Signed)
Inbound call from patient requesting his perception for Suprep be sent to [facility] in [place] [address]. Please advise.

## 2021-08-09 NOTE — Telephone Encounter (Signed)
Patient aware that [facility] [place].

## 2021-08-13 NOTE — Progress Notes (Deleted)
Cardiology Office Note Date:  08/13/2021  Patient ID:  Maxwell Li, DOB [birth year], MRN [id number] PCP:  System, Provider Not In  Cardiologist:  Dr. Percival Spanish Electrophysiologist: Dr. Curt Bears    Chief Complaint: *** lost to f/u  History of Present Illness: Maxwell Li is a 66 y.o. male with history of HTN, HLD, GERD, hypogonadism, advanced heart block w/PPM, asthma, OSA w/CPAP, GERD, Chron's   He comes in today to be seen for Dr. Curt Bears, last seen by him in Sept 2017, at that time doing well, had 1:1 AT episodes that he was asymptomatic with, no changes were made to his tx.  He was cleared for a knee surgery in Feb 2018 by Dr. Percival Spanish.  I saw him 02/08/2017 He is accompanied by his daughter.  He is doing well.  No CP, palpitations, no near syncoe or syncope, no SOB or exertional intolerances.  He c/w a movement type dizziness, particularly when laying on his R side or with quick movents of his head.  He reports being told historically that he had vertigo. He had some AT episodes and his BP elevated, lopressor was increased  No visits since then.  hospital 06/10/2021 with N/V/D, right sided abdominal pain secondary to Crohn's enteritis and a partial SBO Follows with GI and oncology (for leukopenia)  + remotes  *** symptoms, cardiac *** AT burden? *** BP, ? PMD *** DOD  Device information: SJM dual chamber PPM, implanted6/1/17   Past Medical History:  Diagnosis Date   Crohn's disease ([place])    DYSLIPIDEMIA 06/28/2009   Qualifier: Diagnosis of  By: Loanne Drilling MD, Sean A    GERD 09/04/2006   Qualifier: Diagnosis of  By: Loanne Drilling MD, Sean A    Hypertension    HYPOGONADISM, MALE 05/20/2008   Qualifier: Diagnosis of  By: Loanne Drilling MD, Sean A    KNEE PAIN 03/24/2007   Qualifier: Diagnosis of  By: Loanne Drilling MD, Sean A    Ventral hernia     Past Surgical History:  Procedure Laterality Date   APPENDECTOMY     ELECTROCARDIOGRAM  02/01/2006   EP IMPLANTABLE DEVICE N/A  06/09/2015   Procedure: Pacemaker Implant;  Surgeon: Will Meredith Leeds, MD;  Location: [facility];  Service: Cardiovascular;  Laterality: N/A;   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     amLODipine (NORVASC) 5 MG tablet Take 5 mg by mouth daily.     Cholecalciferol (VITAMIN D-3) 125 MCG (5000 UT) TABS Take 1 tablet by mouth daily at 2 PM.     cyanocobalamin (,VITAMIN B-12,) 1000 MCG/ML injection Inject 1 mL (1,000 mcg total) into the skin every 30 (thirty) days. At PCP office 1 mL 0   metoprolol tartrate (LOPRESSOR) 25 MG tablet Take 1 tablet (25 mg total) by mouth 2 (two) times daily. 180 tablet 3   Na Sulfate-K Sulfate-Mg Sulf (SUPREP BOWEL PREP KIT) 17.5-3.13-1.6 GM/177ML SOLN Take 1 kit by mouth as directed. 324 mL 0   omeprazole (PRILOSEC) 20 MG capsule Take 1 capsule (20 mg total) by mouth daily. 90 capsule 1   predniSONE (DELTASONE) 10 MG tablet Take Prednisone 30m tab 3 tabs po once daily x 1 week then 2 tabs po once daily  x 14 days then 1 tab po once daily x 14 days 63 tablet 0   PRESCRIPTION MEDICATION Inhale into the lungs at bedtime. CPAP     testosterone cypionate (DEPOTESTOTERONE CYPIONATE) 200 MG/ML injection Inject 1 mL (200 mg total) into the muscle every 14 (fourteen) days. 10 mL 0   No current facility-administered medications for this visit.    Allergies:   Lidocaine, Lovastatin, and Shellfish allergy   Social History:  The patient  reports that he has quit smoking. His smoking use included cigarettes. He has never used smokeless tobacco. He reports that he does not drink alcohol and does not use drugs.   Family History:  The patient's family history includes Cancer in his brother; Heart attack in his brother;  Stroke in his father.  ROS:  Please see the history of present illness.   All other systems are reviewed and otherwise negative.   PHYSICAL EXAM:  VS:  There were no vitals taken for this visit. BMI: There is no height or weight on file to calculate BMI. Well nourished, well developed, in no acute distress  HEENT: normocephalic, atraumatic  Neck: no JVD, carotid bruits or masses Cardiac:  *** RRR; no significant murmurs, no rubs, or gallops Lungs:  *** CTA b/l, no wheezing, rhonchi or rales  Abd: soft, non-tender, obese MS: no deformity or  atrophy Ext:  *** no edema  Skin: warm and dry, no rash Neuro:  No gross deficits appreciated Psych: euthymic mood, full affect  *** PPM site is stable, no tethering or discomfort   EKG:  done today and reviewed by myself ***  PPM interrogation done today and reviewed by myself:  ***  06/09/15: TTE Study Conclusions - Left ventricle: Abnormal septal motion The cavity size was   normal. Wall thickness was normal. Systolic function was normal.   The estimated ejection fraction was in the range of 50% to 55%.   Wall motion was normal; there were no regional wall motion   abnormalities. Left ventricular diastolic function parameters   were normal. - Atrial septum: A patent foramen ovale cannot be excluded.    Recent Labs: 06/13/2021: B Natriuretic Peptide 54.7; Magnesium 2.1 07/14/2021: ALT 19; BUN 25; Creatinine 0.76; Hemoglobin 15.3; Platelet Count 122; Potassium 4.2; Sodium 133  No results found for requested labs within last 365 days.   CrCl cannot be calculated (Patient's most recent lab result is older than the maximum 21 days allowed.).   Wt Readings from Last 3 Encounters:  07/14/21 163 lb 1.6 oz (74 kg)  07/07/21 168 lb (76.2 kg)  06/12/21 168 lb 14 oz (76.6 kg)     Other studies reviewed: Additional studies/records reviewed today include: summarized above  ASSESSMENT AND PLAN:  1. PPM     *** Intact function     *** no  programming changes made  2. HTN    ***  3. HLD     *** He follows with his PMD  4. AT noted     ***    Disposition: ***   Current medicines are reviewed at length with the patient today.  The patient did not have any concerns regarding medicines.  Venetia Night, PA-C 08/13/2021 9:50 AM     [facility] [address] [place] [address] [phone] (office)  [phone] (fax)

## 2021-08-15 ENCOUNTER — Encounter: Payer: Medicare HMO | Admitting: Physician Assistant

## 2021-08-16 ENCOUNTER — Encounter: Payer: Self-pay | Admitting: Internal Medicine

## 2021-08-16 ENCOUNTER — Ambulatory Visit (AMBULATORY_SURGERY_CENTER): Payer: Medicare HMO | Admitting: Internal Medicine

## 2021-08-16 VITALS — BP 135/87 | HR 93 | Temp 98.9°F | Resp 18 | Ht 63.0 in | Wt 168.0 lb

## 2021-08-16 DIAGNOSIS — K31819 Angiodysplasia of stomach and duodenum without bleeding: Secondary | ICD-10-CM | POA: Diagnosis not present

## 2021-08-16 DIAGNOSIS — D123 Benign neoplasm of transverse colon: Secondary | ICD-10-CM | POA: Diagnosis not present

## 2021-08-16 DIAGNOSIS — K50819 Crohn's disease of both small and large intestine with unspecified complications: Secondary | ICD-10-CM | POA: Diagnosis not present

## 2021-08-16 DIAGNOSIS — K219 Gastro-esophageal reflux disease without esophagitis: Secondary | ICD-10-CM

## 2021-08-16 DIAGNOSIS — K2960 Other gastritis without bleeding: Secondary | ICD-10-CM | POA: Diagnosis not present

## 2021-08-16 DIAGNOSIS — K620 Anal polyp: Secondary | ICD-10-CM | POA: Diagnosis not present

## 2021-08-16 DIAGNOSIS — D122 Benign neoplasm of ascending colon: Secondary | ICD-10-CM | POA: Diagnosis not present

## 2021-08-16 DIAGNOSIS — K56699 Other intestinal obstruction unspecified as to partial versus complete obstruction: Secondary | ICD-10-CM | POA: Diagnosis not present

## 2021-08-16 DIAGNOSIS — R933 Abnormal findings on diagnostic imaging of other parts of digestive tract: Secondary | ICD-10-CM

## 2021-08-16 DIAGNOSIS — R1084 Generalized abdominal pain: Secondary | ICD-10-CM | POA: Diagnosis not present

## 2021-08-16 DIAGNOSIS — K50018 Crohn's disease of small intestine with other complication: Secondary | ICD-10-CM | POA: Diagnosis not present

## 2021-08-16 MED ORDER — SODIUM CHLORIDE 0.9 % IV SOLN: 500.0000 mL | Freq: Once | INTRAVENOUS | Status: DC

## 2021-08-16 NOTE — Progress Notes (Signed)
1427 Pt experienced laryngeal spasm with jaw thrust. Nasopharyngeal airway size 7.0 placed without trauma. Suctioning of secretion.

## 2021-08-16 NOTE — Progress Notes (Signed)
Report given to PACU, vss 

## 2021-08-16 NOTE — Progress Notes (Signed)
1421 Robinul 0.1 mg IV given due large amount of secretions upon assessment.  MD made aware, vss

## 2021-08-16 NOTE — Progress Notes (Signed)
Vitals-Cw  Pt's states no medical or surgical changes since previsit or office visit.

## 2021-08-16 NOTE — Progress Notes (Signed)
1430 HR > 100 with esmolol 25 mg given IV, MD updated, vss

## 2021-08-16 NOTE — Op Note (Signed)
[place] Patient Name: Maxwell Li Procedure Date: 08/16/2021 2:11 PM MRN: [id number] Endoscopist: Jerene Bears , MD Age: 66 Referring MD:  Date of Birth: Sep 30, 1955 Gender: Male Account #: [id number] Procedure:                Colonoscopy Indications:              Generalized abdominal pain, Crohn's disease of                            small and probable large bowel (2015 bx), recent                            hospitalization with small bowel obstruction and                            active enteritis, patient reports good response to                            steroids, but symptoms have worsened with                            prednisone reduction (now on 10 mg daily). Medicines:                Monitored Anesthesia Care Procedure:                Pre-Anesthesia Assessment:                           - Prior to the procedure, a History and Physical                            was performed, and patient medications and                            allergies were reviewed. The patient's tolerance of                            previous anesthesia was also reviewed. The risks                            and benefits of the procedure and the sedation                            options and risks were discussed with the patient.                            All questions were answered, and informed consent                            was obtained. Prior Anticoagulants: The patient has                            taken no previous anticoagulant or antiplatelet  agents. ASA Grade Assessment: III - A patient with                            severe systemic disease. After reviewing the risks                            and benefits, the patient was deemed in                            satisfactory condition to undergo the procedure.                           After obtaining informed consent, the colonoscope                            was passed under direct  vision. Throughout the                            procedure, the patient's blood pressure, pulse, and                            oxygen saturations were monitored continuously. The                            CF [id number] #[id number] was introduced through the anus                            and advanced to the terminal ileum. The colonoscopy                            was performed without difficulty. The patient                            tolerated the procedure well. The quality of the                            bowel preparation was good. The terminal ileum,                            ileocecal valve, appendiceal orifice, and rectum                            were photographed. Scope In: 2:33:23 PM Scope Out: 2:55:14 PM Scope Withdrawal Time: 0 hours 20 minutes 0 seconds  Total Procedure Duration: 0 hours 21 minutes 51 seconds  Findings:                 The digital rectal exam was normal.                           The ileocecal valve contained a benign-appearing,                            intrinsic mild stenosis. The adult colonoscope  could not be advanced into the TI, but it was seen                            as below.                           The terminal ileum contained a few erosions.                            Biopsies were taken with a cold forceps for                            histology.                           A single medium-mouthed diverticulum was found in                            the cecum.                           A 10 mm polyp was found in the ascending colon. The                            polyp was sessile. The polyp was removed with a                            cold snare. Resection and retrieval were complete.                           Three sessile polyps were found in the transverse                            colon. The polyps were 6 to 10 mm in size. These                            polyps were removed with a cold snare. Resection                             and retrieval were complete.                           Normal mucosa was found in the entire colon. No                            evidence for colitis on today's exam. Biopsies were                            taken with a cold forceps for histology (right and                            left colon in separate jars).  A 6 mm polyp versus skin tag was found in the anal                            canal. The polyp was semi-pedunculated. Biopsy was                            taken with a cold forceps for histology to exclude                            AIN.                           Internal hemorrhoids were found during                            retroflexion. The hemorrhoids were small. Complications:            No immediate complications. Estimated Blood Loss:     Estimated blood loss was minimal. 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                           - A few erosions in the terminal ileum. TI not                            fully intubated as above. Biopsied.                           - Diverticulosis in the cecum.                           - One 10 mm polyp in the ascending colon, removed                            with a cold snare. Resected and retrieved.                           - Three 6 to 10 mm polyps in the transverse colon,                            removed with a cold snare. Resected and retrieved.                           - Normal mucosa in the entire examined colon                            without colitis. Biopsied.                           - One 6 mm polyp at the anal canal (vs. skin tag).  Biopsied.                           - Internal hemorrhoids. Recommendation:           - Patient has a contact number available for                            emergencies. The signs and symptoms of potential                             delayed complications were discussed with the                            patient. Return to normal activities tomorrow.                            Written discharge instructions were provided to the                            patient.                           - Resume previous diet.                           - Continue present medications.                           - Await pathology results. Will use pathology                            results to guide treatment decisions but expect                            initiation of biologic for Crohn's disease.                           - Office follow-up next available with me.                           - Repeat colonoscopy is recommended for                            surveillance. The colonoscopy date will be                            determined after pathology results from today's                            exam become available for review. Jerene Bears, MD 08/16/2021 3:09:39 PM This report has been signed electronically.

## 2021-08-16 NOTE — Progress Notes (Signed)
Called to room to assist during endoscopic procedure.  Patient ID and intended procedure confirmed with present staff. Received instructions for my participation in the procedure from the performing physician.  

## 2021-08-16 NOTE — Patient Instructions (Signed)
YOU HAD AN ENDOSCOPIC PROCEDURE TODAY AT [facility]:   Refer to the procedure report that was given to you for any specific questions about what was found during the examination.  If the procedure report does not answer your questions, please call your gastroenterologist to clarify.  If you requested that your care partner not be given the details of your procedure findings, then the procedure report has been included in a sealed envelope for you to review at your convenience later.  YOU SHOULD EXPECT: Some feelings of bloating in the abdomen. Passage of more gas than usual.  Walking can help get rid of the air that was put into your GI tract during the procedure and reduce the bloating. If you had a lower endoscopy (such as a colonoscopy or flexible sigmoidoscopy) you may notice spotting of blood in your stool or on the toilet paper. If you underwent a bowel prep for your procedure, you may not have a normal bowel movement for a few days.  Please Note:  You might notice some irritation and congestion in your nose or some drainage.  This is from the oxygen used during your procedure.  There is no need for concern and it should clear up in a day or so.  SYMPTOMS TO REPORT IMMEDIATELY:  Following lower endoscopy (colonoscopy or flexible sigmoidoscopy):  Excessive amounts of blood in the stool  Significant tenderness or worsening of abdominal pains  Swelling of the abdomen that is new, acute  Fever of 100F or higher  Following upper endoscopy (EGD)  Vomiting of blood or coffee ground material  New chest pain or pain under the shoulder blades  Painful or persistently difficult swallowing  New shortness of breath  Fever of 100F or higher  Black, tarry-looking stools  For urgent or emergent issues, a gastroenterologist can be reached at any hour by calling [phone]. Do not use MyChart messaging for urgent concerns.    DIET:  We do recommend a small meal at first, but  then you may proceed to your regular diet.  Drink plenty of fluids but you should avoid alcoholic beverages for 24 hours.  ACTIVITY:  You should plan to take it easy for the rest of today and you should NOT DRIVE or use heavy machinery until tomorrow (because of the sedation medicines used during the test).    FOLLOW UP: Our staff will call the number listed on your records the next business day following your procedure.  We will call around 7:15- 8:00 am to check on you and address any questions or concerns that you may have regarding the information given to you following your procedure. If we do not reach you, we will leave a message.  If you develop any symptoms (ie: fever, flu-like symptoms, shortness of breath, cough etc.) before then, please call [phone].  If you test positive for Covid 19 in the 2 weeks post procedure, please call and report this information to [place].    If any biopsies were taken you will be contacted by phone or by letter within the next 1-3 weeks.  Please call us at [phone] if you have not heard about the biopsies in 3 weeks.    SIGNATURES/CONFIDENTIALITY: You and/or your care partner have signed paperwork which will be entered into your electronic medical record.  These signatures attest to the fact that that the information above on your After Visit Summary has been reviewed and is understood.  Full responsibility of the confidentiality  of this discharge information lies with you and/or your care-partner.

## 2021-08-16 NOTE — Op Note (Signed)
[place] Patient Name: Maxwell Li Procedure Date: 08/16/2021 2:11 PM MRN: [id number] Endoscopist: Jerene Bears , MD Age: 66 Referring MD:  Date of Birth: 1955/12/27 Gender: Male Account #: [id number] Procedure:                Upper GI endoscopy Indications:              Generalized abdominal pain, Crohn's disease of                            small and probable large bowel (2015 bx), recent                            hospitalization with small bowel obstruction and                            active enteritis, patient reports good response to                            steroids, but symptoms have worsened with                            prednisone reduction (now on 10 mg daily). Medicines:                Monitored Anesthesia Care Procedure:                Pre-Anesthesia Assessment:                           - Prior to the procedure, a History and Physical                            was performed, and patient medications and                            allergies were reviewed. The patient's tolerance of                            previous anesthesia was also reviewed. The risks                            and benefits of the procedure and the sedation                            options and risks were discussed with the patient.                            All questions were answered, and informed consent                            was obtained. Prior Anticoagulants: The patient has                            taken no previous anticoagulant or antiplatelet  agents. ASA Grade Assessment: III - A patient with                            severe systemic disease. After reviewing the risks                            and benefits, the patient was deemed in                            satisfactory condition to undergo the procedure.                           After obtaining informed consent, the endoscope was                            passed under direct  vision. Throughout the                            procedure, the patient's blood pressure, pulse, and                            oxygen saturations were monitored continuously. The                            GIF [id number] #[id number] was introduced through the                            mouth, and advanced to the second part of duodenum.                            The upper GI endoscopy was accomplished without                            difficulty. The patient tolerated the procedure                            well. Scope In: Scope Out: Findings:                 The examined esophagus was normal. Z-line regular                            at 40 cm.                           Patchy mild inflammation characterized by                            congestion (edema), erosions and erythema was found                            at the incisura and in the gastric antrum. Biopsies                            were taken with a cold  forceps for histology and                            Helicobacter pylori testing.                           The examined duodenum was normal. Biopsies were                            taken with a cold forceps for histology. Complications:            No immediate complications. Estimated Blood Loss:     Estimated blood loss was minimal. Impression:               - Normal esophagus.                           - Gastritis. Biopsied.                           - Normal examined duodenum. Biopsied. Recommendation:           - Patient has a contact number available for                            emergencies. The signs and symptoms of potential                            delayed complications were discussed with the                            patient. Return to normal activities tomorrow.                            Written discharge instructions were provided to the                            patient.                           - Resume previous diet.                           - Continue  present medications.                           - Await pathology results.                           - See the other procedure note for documentation of                            additional recommendations. Jerene Bears, MD 08/16/2021 3:01:40 PM This report has been signed electronically.

## 2021-08-16 NOTE — Progress Notes (Signed)
GASTROENTEROLOGY PROCEDURE H&P NOTE   Primary Care Physician: System, Provider Not In    Reason for Procedure:  History of small bowel and colonic Crohn's disease, hospital admission in June with nausea, vomiting and diarrhea with abdominal pain, abnormal CT abdomen pelvis, chronic GERD  Plan:    EGD and colonoscopy  Patient is appropriate for endoscopic procedure(s) in the ambulatory ([facility]) setting.  The nature of the procedure, as well as the risks, benefits, and alternatives were carefully and thoroughly reviewed with the patient. Ample time for discussion and questions allowed. The patient understood, was satisfied, and agreed to proceed.     HPI: Maxwell Li is a 65 y.o. male who presents for EGD and colonoscopy.  Medical history as below.  Tolerated the prep.  No recent chest pain or shortness of breath.  No abdominal pain today.  Past Medical History:  Diagnosis Date   Crohn's disease ([place])    DYSLIPIDEMIA 06/28/2009   Qualifier: Diagnosis of  By: Loanne Drilling MD, Sean A    GERD 09/04/2006   Qualifier: Diagnosis of  By: Loanne Drilling MD, Sean A    Hypertension    HYPOGONADISM, MALE 05/20/2008   Qualifier: Diagnosis of  By: Loanne Drilling MD, Sean A    KNEE PAIN 03/24/2007   Qualifier: Diagnosis of  By: Loanne Drilling MD, Sean A    Ventral hernia     Past Surgical History:  Procedure Laterality Date   APPENDECTOMY     ELECTROCARDIOGRAM  02/01/2006   EP IMPLANTABLE DEVICE N/A 06/09/2015   Procedure: Pacemaker Implant;  Surgeon: Will Meredith Leeds, MD;  Location: [facility];  Service: Cardiovascular;  Laterality: N/A;   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 Authorizing Provider  albuterol (VENTOLIN HFA) 108 (90 Base) MCG/ACT inhaler Inhale 2 puffs into the lungs every 6 (six) hours as needed for wheezing or shortness of breath.   Yes [provider]   amLODipine (NORVASC) 5 MG tablet Take 5 mg by mouth daily.   Yes [provider]  Cholecalciferol (VITAMIN D-3) 125 MCG (5000 UT) TABS Take 1 tablet by mouth daily at 2 PM.   Yes [provider]  cyanocobalamin (,VITAMIN B-12,) 1000 MCG/ML injection Inject 1 mL (1,000 mcg total) into the skin every 30 (thirty) days. At PCP office 07/12/21  Yes Thurnell Lose, MD  omeprazole (PRILOSEC) 20 MG capsule Take 1 capsule (20 mg total) by mouth daily. 07/07/21  Yes Noralyn Pick, NP  predniSONE (DELTASONE) 10 MG tablet Take Prednisone 32m tab 3 tabs po once daily x 1 week then 2 tabs po once daily x 14 days then 1 tab po once daily x 14 days 07/07/21  Yes KNoralyn Pick NP  PRESCRIPTION MEDICATION Inhale into the lungs at bedtime. CPAP   Yes [provider]  testosterone cypionate (DEPOTESTOTERONE CYPIONATE) 200 MG/ML injection Inject 1 mL (200 mg total) into the muscle every 14 (fourteen) days. 04/28/13  Yes ERenato Shin MD  metoprolol tartrate (LOPRESSOR) 25 MG tablet Take 1 tablet (25 mg total) by mouth 2 (two) times daily. 02/08/17 07/11/21  UBaldwin Jamaica PA-C    Current Outpatient Medications  Medication Sig Dispense Refill   albuterol (VENTOLIN HFA) 108 (90 Base) MCG/ACT inhaler Inhale 2 puffs into the lungs every 6 (six) hours as needed for wheezing or shortness of breath.  amLODipine (NORVASC) 5 MG tablet Take 5 mg by mouth daily.     Cholecalciferol (VITAMIN D-3) 125 MCG (5000 UT) TABS Take 1 tablet by mouth daily at 2 PM.     cyanocobalamin (,VITAMIN B-12,) 1000 MCG/ML injection Inject 1 mL (1,000 mcg total) into the skin every 30 (thirty) days. At PCP office 1 mL 0   omeprazole (PRILOSEC) 20 MG capsule Take 1 capsule (20 mg total) by mouth daily. 90 capsule 1   predniSONE (DELTASONE) 10 MG tablet Take Prednisone 59m tab 3 tabs po once daily x 1 week then 2 tabs po once daily x 14 days then 1 tab po once daily x 14 days 63 tablet 0    PRESCRIPTION MEDICATION Inhale into the lungs at bedtime. CPAP     testosterone cypionate (DEPOTESTOTERONE CYPIONATE) 200 MG/ML injection Inject 1 mL (200 mg total) into the muscle every 14 (fourteen) days. 10 mL 0   metoprolol tartrate (LOPRESSOR) 25 MG tablet Take 1 tablet (25 mg total) by mouth 2 (two) times daily. 180 tablet 3   Current Facility-Administered Medications  Medication Dose Route Frequency Provider Last Rate Last Admin   0.9 %  sodium chloride infusion  500 mL Intravenous Once Kiersten Coss, JLajuan Lines MD        Allergies as of 08/16/2021 - Review Complete 08/16/2021  Allergen Reaction Noted   Lidocaine Swelling 06/08/2015   Lovastatin     Shellfish allergy Itching 06/08/2015    Family History  Problem Relation Age of Onset   Stroke Father    Cancer Brother        Lung Cancer   Heart attack Brother    Colon cancer Neg Hx    Esophageal cancer Neg Hx    Pancreatic cancer Neg Hx    Stomach cancer Neg Hx     Social History   Socioeconomic History   Marital status: Divorced    Spouse name: Not on file   Number of children: Not on file   Years of education: Not on file   Highest education level: Not on file  Occupational History   Occupation: PTheme park manager Tobacco Use   Smoking status: Former    Types: Cigarettes   Smokeless tobacco: Never  VScientific laboratory technicianUse: Never used  Substance and Sexual Activity   Alcohol use: No   Drug use: No   Sexual activity: Not on file  Other Topics Concern   Not on file  Social History Narrative   Not on file   Social Determinants of Health   Financial Resource Strain: Not on file  Food Insecurity: Not on file  Transportation Needs: Not on file  Physical Activity: Not on file  Stress: Not on file  Social Connections: Not on file  Intimate Partner Violence: Not on file    Physical Exam: Vital signs in last 24 hours: @BP  (!) 162/98 (BP Location: Right Arm, Patient Position: Sitting, Cuff Size: Normal)   Pulse 94   Temp  98.9 F (37.2 C) (Temporal)   Ht 5' 3"  (1.6 m)   Wt 168 lb (76.2 kg)   SpO2 91%   BMI 29.76 kg/m  GEN: NAD EYE: Sclerae anicteric ENT: MMM CV: Non-tachycardic Pulm: CTA b/l GI: Soft, NT/ND NEURO:  Alert & Oriented x 3   JZenovia Jarred MD [facility]  08/16/2021 2:04 PM

## 2021-08-17 ENCOUNTER — Telehealth: Payer: Self-pay

## 2021-08-17 NOTE — Telephone Encounter (Signed)
  Follow up Call-     08/16/2021    1:50 PM  Call back number  Permission to leave phone message Yes     Patient questions:  Do you have a fever, pain , or abdominal swelling? No. Pain Score  0 *  Have you tolerated food without any problems? Yes.    Have you been able to return to your normal activities? Yes.    Do you have any questions about your discharge instructions: Diet   No. Medications  No. Follow up visit  No.  Do you have questions or concerns about your Care? No.  Actions: * If pain score is 4 or above: No action needed, pain <4.

## 2021-08-25 ENCOUNTER — Other Ambulatory Visit: Payer: Self-pay | Admitting: Internal Medicine

## 2021-08-25 DIAGNOSIS — K50819 Crohn's disease of both small and large intestine with unspecified complications: Secondary | ICD-10-CM

## 2021-08-28 ENCOUNTER — Other Ambulatory Visit: Payer: Self-pay

## 2021-08-28 DIAGNOSIS — K50819 Crohn's disease of both small and large intestine with unspecified complications: Secondary | ICD-10-CM

## 2021-08-29 ENCOUNTER — Telehealth: Payer: Self-pay | Admitting: Pharmacy Technician

## 2021-08-29 ENCOUNTER — Other Ambulatory Visit: Payer: Medicare HMO

## 2021-08-29 DIAGNOSIS — K50819 Crohn's disease of both small and large intestine with unspecified complications: Secondary | ICD-10-CM

## 2021-08-29 NOTE — Telephone Encounter (Signed)
Ok, thanks It should be noted that he has chronic leukopenia and I would like to avoid anti-TNF if possible

## 2021-08-29 NOTE — Telephone Encounter (Signed)
Joelene Millin Would Skyrizi be approved now (before anti-TNF)? Thanks [employer]

## 2021-08-29 NOTE — Telephone Encounter (Addendum)
Dr. Hilarie Fredrickson. Stelara sq has been denied. Please advise   Auth Submission: approved/denied: PENDING: stelar IV Payer: aetna medicare Medication & CPT/J Code(s) submitted: Stelara Infusion (Ustekinumab) [id number] Route of submission (phone, fax, portal): phone Phone # [phone] Fax # [phone] Auth type: Buy/Bill Units/visits requested:390 mg x 1 dose Reference number:  Approval from:  to  at [place] as      STELARA SQ: DENIED  Auth Submission:  Medication & CPT/J Code(s) submitted:  Route of submission (phone, fax, portal)phone: [phone] Phone # [phone] Fax # [phone] Auth type: Pharmacy Benefit Units/visits requested: 21m q56days [facility]: [facility]:  [place] Denied due to patient has not tried and or failed Hum[employer] or [employer]

## 2021-08-29 NOTE — Telephone Encounter (Signed)
Noted.  [place], thanks

## 2021-08-29 NOTE — Telephone Encounter (Signed)
Dr. Hilarie Fredrickson, Orson Ape will still require a PA.  I will submit the auth now and f/u once I have a response.

## 2021-08-30 ENCOUNTER — Other Ambulatory Visit: Payer: Self-pay | Admitting: Pharmacy Technician

## 2021-08-30 LAB — HEPATITIS C ANTIBODY: Hepatitis C Ab: NONREACTIVE

## 2021-08-30 LAB — HEPATITIS B SURFACE ANTIBODY,QUALITATIVE: Hep B S Ab: NONREACTIVE

## 2021-08-30 LAB — HEPATITIS B SURFACE ANTIGEN: Hepatitis B Surface Ag: NONREACTIVE

## 2021-08-30 LAB — HEPATITIS B CORE ANTIBODY, TOTAL: Hep B Core Total Ab: NONREACTIVE

## 2021-08-30 NOTE — Telephone Encounter (Signed)
Dr. Hilarie Fredrickson, Juluis Rainier note:   Auth Submission: APPROVED Payer: [place] Medication & CPT/J Code(s) submitted: Orson Ape [employer]) [phone] Route of submission (phone, fax, portal):  Phone # [phone] Fax [phone] Auth type: Buy/Bill Units/visits requested: x3 doses Reference number: [id number] Approval from: 08/29/21 to 08/30/22 at [place] as Buy/Bill  Patient will be scheduled as soon as possible.  @[place] , once induction doses are complete (x3 doses) please submit PA for onbody injector (pharmacy benefit)  [place] co-pay card: pending

## 2021-08-30 NOTE — Telephone Encounter (Signed)
Linda please let patient know by phone that we are going to put him on Skyrizi --this is also excellent Crohn's therapy Initially I had discussed Stelara with him but it was not approved by his insurance Please let him know that I am 100% okay with Orson Ape and I think it will significantly help his Crohn's disease The infusion center will be contacting him It is important that he get doses on time and not miss doses He needs to have scheduled follow-up with me See lab result note

## 2021-08-31 NOTE — Telephone Encounter (Signed)
Spoke wih pt and he is aware of Dr. Vena Rua recommendations and about starting on Skyrizi. OV already scheduled.

## 2021-09-01 LAB — QUANTIFERON-TB GOLD PLUS
Mitogen-NIL: 9.83 IU/mL
NIL: 0.04 IU/mL
QuantiFERON-TB Gold Plus: NEGATIVE
TB1-NIL: <0 IU/mL
TB2-NIL: <0 IU/mL

## 2021-09-07 ENCOUNTER — Ambulatory Visit (INDEPENDENT_AMBULATORY_CARE_PROVIDER_SITE_OTHER): Payer: Medicare HMO

## 2021-09-07 VITALS — BP 128/79 | HR 74 | Temp 97.6°F | Resp 18 | Ht 63.0 in | Wt 169.6 lb

## 2021-09-07 DIAGNOSIS — K50819 Crohn's disease of both small and large intestine with unspecified complications: Secondary | ICD-10-CM

## 2021-09-07 MED ORDER — RISANKIZUMAB-RZAA 600 MG/10ML IV SOLN
600.0000 mg | Freq: Once | INTRAVENOUS | Status: AC
  Administered 2021-09-07: 600 mg via INTRAVENOUS
  Filled 2021-09-07: qty 10

## 2021-09-07 NOTE — Progress Notes (Signed)
Diagnosis: Crohn's Disease  Provider:  Marshell Garfinkel MD  Procedure: Infusion  IV Type: Peripheral, IV Location: R Forearm  Skyrizi, Dose: 600  Infusion Start Time: 8502  Infusion Stop Time: 7741  Post Infusion IV Care: Observation period completed and Peripheral IV Discontinued  Discharge: Condition: Good, Destination: Home . AVS provided to patient.   Performed by:  Cleophus Molt, RN

## 2021-09-18 ENCOUNTER — Telehealth: Payer: Self-pay | Admitting: Pharmacy Technician

## 2021-09-18 NOTE — Telephone Encounter (Signed)
360 mg maintenance dose after standard induction

## 2021-09-18 NOTE — Telephone Encounter (Signed)
Patient Advocate Encounter  Received notification that prior authorization for [facility] is required.   PA submitted on 9.12.23 Key [id number] Status is pending    Luciano Cutter, CPhT Patient Advocate Phone: [phone]

## 2021-09-18 NOTE — Telephone Encounter (Signed)
Please see note below and advise  

## 2021-09-19 ENCOUNTER — Other Ambulatory Visit (HOSPITAL_COMMUNITY): Payer: Self-pay

## 2021-09-20 ENCOUNTER — Ambulatory Visit (INDEPENDENT_AMBULATORY_CARE_PROVIDER_SITE_OTHER): Payer: Medicare HMO

## 2021-09-20 DIAGNOSIS — I442 Atrioventricular block, complete: Secondary | ICD-10-CM

## 2021-09-21 ENCOUNTER — Other Ambulatory Visit (HOSPITAL_COMMUNITY): Payer: Self-pay

## 2021-09-21 LAB — CUP PACEART REMOTE DEVICE CHECK
Battery Remaining Longevity: 61 mo
Battery Remaining Percentage: 49 %
Battery Voltage: 2.99 V
Brady Statistic AP VP Percent: 1 %
Brady Statistic AP VS Percent: 2.4 %
Brady Statistic AS VP Percent: 1.8 %
Brady Statistic AS VS Percent: 96 %
Brady Statistic RA Percent Paced: 2.4 %
Brady Statistic RV Percent Paced: 2 %
Date Time Interrogation Session: 20230913020014
Implantable Lead Implant Date: 20170601
Implantable Lead Implant Date: 20170601
Implantable Lead Location: 753859
Implantable Lead Location: 753860
Implantable Pulse Generator Implant Date: 20170601
Lead Channel Impedance Value: 480 Ohm
Lead Channel Impedance Value: 540 Ohm
Lead Channel Pacing Threshold Amplitude: 0.625 V
Lead Channel Pacing Threshold Amplitude: 0.75 V
Lead Channel Pacing Threshold Pulse Width: 0.5 ms
Lead Channel Pacing Threshold Pulse Width: 0.5 ms
Lead Channel Sensing Intrinsic Amplitude: 1.5 mV
Lead Channel Sensing Intrinsic Amplitude: 9.2 mV
Lead Channel Setting Pacing Amplitude: 1.625
Lead Channel Setting Pacing Amplitude: 2.5 V
Lead Channel Setting Pacing Pulse Width: 0.5 ms
Lead Channel Setting Sensing Sensitivity: 2 mV
Pulse Gen Model: 2272
Pulse Gen Serial Number: 7904591

## 2021-09-21 NOTE — Telephone Encounter (Signed)
Patient Advocate Encounter  Prior Authorization for [employer] 360MG/2.4ML cartridges has been approved.    Effective: 09-21-2021 to 01-07-2022

## 2021-09-29 ENCOUNTER — Encounter: Payer: Self-pay | Admitting: *Deleted

## 2021-10-05 ENCOUNTER — Ambulatory Visit (INDEPENDENT_AMBULATORY_CARE_PROVIDER_SITE_OTHER): Payer: Medicare HMO

## 2021-10-05 VITALS — BP 155/90 | HR 76 | Temp 98.1°F | Resp 18 | Ht 63.0 in | Wt 174.4 lb

## 2021-10-05 DIAGNOSIS — K50819 Crohn's disease of both small and large intestine with unspecified complications: Secondary | ICD-10-CM | POA: Diagnosis not present

## 2021-10-05 MED ORDER — RISANKIZUMAB-RZAA 600 MG/10ML IV SOLN
600.0000 mg | Freq: Once | INTRAVENOUS | Status: AC
  Administered 2021-10-05: 600 mg via INTRAVENOUS
  Filled 2021-10-05: qty 10

## 2021-10-05 NOTE — Progress Notes (Signed)
Diagnosis: Crohn's Disease  Provider:  Marshell Garfinkel MD  Procedure: Infusion  IV Type: Peripheral, IV Location: R Hand  Skyrizi, Dose: 630m  Infusion Start Time: 10335 Infusion Stop Time: 1122  Post Infusion IV Care: Peripheral IV Discontinued  Discharge: Condition: Good, Destination: Home . AVS provided to patient.   Performed by:  CKoren Shiver RN

## 2021-10-05 NOTE — Progress Notes (Signed)
Remote pacemaker transmission.   

## 2021-10-10 ENCOUNTER — Other Ambulatory Visit (INDEPENDENT_AMBULATORY_CARE_PROVIDER_SITE_OTHER): Payer: Medicare HMO

## 2021-10-10 ENCOUNTER — Encounter: Payer: Self-pay | Admitting: Internal Medicine

## 2021-10-10 ENCOUNTER — Ambulatory Visit: Payer: Medicare HMO | Admitting: Internal Medicine

## 2021-10-10 VITALS — BP 122/76 | HR 85 | Ht 63.0 in | Wt 172.0 lb

## 2021-10-10 DIAGNOSIS — M25552 Pain in left hip: Secondary | ICD-10-CM | POA: Diagnosis not present

## 2021-10-10 DIAGNOSIS — D72819 Decreased white blood cell count, unspecified: Secondary | ICD-10-CM | POA: Diagnosis not present

## 2021-10-10 DIAGNOSIS — K50819 Crohn's disease of both small and large intestine with unspecified complications: Secondary | ICD-10-CM | POA: Diagnosis not present

## 2021-10-10 DIAGNOSIS — Z86010 Personal history of colonic polyps: Secondary | ICD-10-CM

## 2021-10-10 DIAGNOSIS — M25559 Pain in unspecified hip: Secondary | ICD-10-CM

## 2021-10-10 LAB — CBC
HCT: 45.3 % (ref 39.0–52.0)
Hemoglobin: 15.1 g/dL (ref 13.0–17.0)
MCHC: 33.4 g/dL (ref 30.0–36.0)
MCV: 96.8 fl (ref 78.0–100.0)
Platelets: 134 10*3/uL — ABNORMAL LOW (ref 150.0–400.0)
RBC: 4.68 Mil/uL (ref 4.22–5.81)
RDW: 14.7 % (ref 11.5–15.5)
WBC: 2.7 10*3/uL — ABNORMAL LOW (ref 4.0–10.5)

## 2021-10-10 LAB — B12 AND FOLATE PANEL
Folate: 4.5 ng/mL — ABNORMAL LOW (ref >5.9)
Vitamin B-12: 188 pg/mL — ABNORMAL LOW (ref 211–911)

## 2021-10-10 MED ORDER — PREDNISONE 10 MG PO TABS: ORAL_TABLET | ORAL | 0 refills | Status: DC

## 2021-10-10 NOTE — Patient Instructions (Addendum)
_______________________________________________________  If you are age 66 or older, your body mass index should be between 23-30. Your Body mass index is 30.47 kg/m. If this is out of the aforementioned range listed, please consider follow up with your Primary Care Provider. ________________________________________________________  The  GI providers would like to encourage you to use [facility] to communicate with providers for non-urgent requests or questions.  Due to long hold times on the telephone, sending your provider a message by [facility] may be a faster and more efficient way to get a response.  Please allow 48 business hours for a response.  Please remember that this is for non-urgent requests.  _______________________________________________________  Your provider has requested that you go to the basement level for lab work before leaving today. Press "B" on the elevator. The lab is located at the first door on the left as you exit the elevator.  Due to recent changes in healthcare laws, you may see the results of your imaging and laboratory studies on MyChart before your provider has had a chance to review them.  We understand that in some cases there may be results that are confusing or concerning to you. Not all laboratory results come back in the same time frame and the provider may be waiting for multiple results in order to interpret others.  Please give [place] 48 hours in order for your provider to thoroughly review all the results before contacting the office for clarification of your results.   We have sent the following medications to your pharmacy for you to pick up at your convenience:  CONTINUE: Prednisone 66m for 6 weeks, them reduce to 518mfor 4 weeks, then stop.  You are scheduled to follow up in our office on 01-02-22 at 2:30pm.  We will refer you to ZaCharlann Boxeror hip pain.  Someone will contact you with an appointment.  Thank you for entrusting me with your care and  [facility] Fredrickson

## 2021-10-11 ENCOUNTER — Encounter: Payer: Self-pay | Admitting: Internal Medicine

## 2021-10-11 ENCOUNTER — Other Ambulatory Visit: Payer: Self-pay

## 2021-10-11 DIAGNOSIS — K50819 Crohn's disease of both small and large intestine with unspecified complications: Secondary | ICD-10-CM

## 2021-10-11 LAB — COMPREHENSIVE METABOLIC PANEL
ALT: 5 U/L (ref 0–53)
AST: 11 U/L (ref 0–37)
Albumin: 3.9 g/dL (ref 3.5–5.2)
Alkaline Phosphatase: 71 U/L (ref 39–117)
BUN: 6 mg/dL (ref 6–23)
CO2: 31 mEq/L (ref 19–32)
Calcium: 9.3 mg/dL (ref 8.4–10.5)
Chloride: 95 mEq/L — ABNORMAL LOW (ref 96–112)
Creatinine, Ser: 0.64 mg/dL (ref 0.40–1.50)
GFR: 98.74 mL/min (ref >60.00)
Glucose, Bld: 90 mg/dL (ref 70–99)
Potassium: 3.9 mEq/L (ref 3.5–5.1)
Sodium: 135 mEq/L (ref 135–145)
Total Bilirubin: 0.4 mg/dL (ref 0.2–1.2)
Total Protein: 7.2 g/dL (ref 6.0–8.3)

## 2021-10-11 NOTE — Progress Notes (Signed)
Subjective:    Patient ID: Maxwell Li, male    DOB: 1955-09-23, 66 y.o.   MRN: [id number]  HPI Maxwell Li is a 66 year old male with a history of Crohn's disease of the small and large intestine (diagnosis 2015 in [facility]), previously treated with sulfasalazine, hospitalization for the same in June 2023, 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 Lorenso Courier, hypertension, hyperlipidemia, sleep apnea who is seen for follow-up.  He is here alone today and was last in the office on 07/07/2021 and for upper and lower endoscopy in early August.    On 09/07/2021 he started [place] with the initial induction infusion.  He reports that he weaned off prednisone entirely about a month ago.  He had 2 days of abdominal pain, spastic in nature as well as abdominal bloating in mid September.  He attributes this to diet specifically eating broccoli.  He worked through this pain without going back to the ER.  He feels that his symptoms have regressed since stopping steroids having looser more diarrhea-like stools 2 times per day.  He is also had ongoing fatigue.  Most of the symptoms had resolved on prednisone.  He notes stools were solid during the last week of his steroid taper.  Fortunately he has not had additional abdominal pain.  He is having severe left hip pain which is chronic in nature.  No blood in stool or melena.  He has not noticed mucus in stools.  He has his third and final Skyrizi infusion scheduled for 11/02/2021.  He will then go to the injector pack thereafter every 8 weeks.    Review of Systems As per HPI, otherwise normal  Current Medications, Allergies, Past Medical History, Past Surgical History, Family History and Social History were reviewed in [employer] record.     Objective:   Physical Exam BP 122/76   Pulse 85   Ht 5' 3"  (1.6 m)   Wt 172 lb (78 kg)   SpO2 91%   BMI 30.47 kg/m  Gen: awake, alert, NAD HEENT: anicteric CV: RRR, soft  sem Pulm: CTA b/l Abd: soft, NT/ND, +BS throughout Ext: no c/c/e Neuro: nonfocal     Latest Ref Rng & Units 10/10/2021    4:51 PM 07/14/2021    8:48 AM 07/07/2021   10:43 AM  CBC  WBC 4.0 - 10.5 K/uL 2.7  2.8  5.1   Hemoglobin 13.0 - 17.0 g/dL 15.1  15.3  14.4   Hematocrit 39.0 - 52.0 % 45.3  44.5  43.9   Platelets 150.0 - 400.0 K/uL 134.0  122  124.0    CMP     Component Value Date/Time   NA 135 10/10/2021 1651   K 3.9 10/10/2021 1651   CL 95 (L) 10/10/2021 1651   CO2 31 10/10/2021 1651   GLUCOSE 90 10/10/2021 1651   BUN 6 10/10/2021 1651   CREATININE 0.64 10/10/2021 1651   CREATININE 0.76 07/14/2021 0848   CALCIUM 9.3 10/10/2021 1651   PROT 7.2 10/10/2021 1651   ALBUMIN 3.9 10/10/2021 1651   AST 11 10/10/2021 1651   AST 14 (L) 07/14/2021 0848   ALT 5 10/10/2021 1651   ALT 19 07/14/2021 0848   ALKPHOS 71 10/10/2021 1651   BILITOT 0.4 10/10/2021 1651   BILITOT 0.9 07/14/2021 0848   GFRNONAA >60 07/14/2021 0848   GFRAA >60 03/16/2017 0545         Assessment & Plan:  66 year old male with a history of Crohn's  disease of the small and large intestine (diagnosis 2015 in [facility]), previously treated with sulfasalazine, hospitalization for the same in June 2023, 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 Lorenso Courier, hypertension, hyperlipidemia, sleep apnea who is seen for follow-up.    Crohn's ileocolitis, Skyrizi initiation August 2023 --overall he is certainly better than he was several months ago though he has noticed some looser stools since stopping steroids.  We are just 1 month into [place] but hopefully this will be the ideal therapy for him.  I am going to put him back on low-dose steroids for 8 weeks to allow full Skyrizi efficacy. -- Low residue diet with high-protein low processed foods -- Continue Skyrizi with third and final infusion followed by subcutaneous dosing every 8 weeks; 360 mg dose every 8 weeks by on body injector -- Prednisone 10 mg daily x6 weeks and 5  mg daily x4 weeks and then stop -- Office follow-up with me in December -- CBC, CMP, B12 and folate today  2.  Chronic leukopenia --seen by hematology and felt secondary to active Crohn's inflammation.  Continue Crohn's therapy as above.  Recheck CBC today.  Also to remain off sulfasalazine  3.  History of adenomatous colon polyps --repeat colonoscopy recommended in no longer than 2 years which would be on or before August 2025. . 4.  Severe hip pain --prior improvement with steroid injection, referred to Dr. Tamala Julian with sports medicine  40 minutes total spent today including patient facing time, coordination of care, reviewing medical history/procedures/pertinent radiology studies, and documentation of the encounter.

## 2021-10-16 NOTE — Progress Notes (Signed)
Maxwell Li [address] [facility] Phone: [phone] Subjective:   Maxwell Li, am serving as a scribe for Dr. Hulan Saas.  I'm seeing this patient by the request  of:  Rubie Maid, MD  CC: left hip pain   [id number]  Maxwell Li is a 66 y.o. male coming in with complaint of L hip pain. Patient states chronic left groin pain for the last 2 years. Has been getting progressively worse. Had a hip injection 2 years ago that didn't really help. Replacement was not recommended just to take Tylenol. No interventions seem to help. Pain is located on anterior side of hip near groin.   CT scan of the abdomen done in June when patient was having a Crohn's flare.  In addition patient was found to have a ventral hernia of the right anterior lateral aspect but otherwise unremarkable.  Past Medical History:  Diagnosis Date   Adenomatous colon polyp    B12 deficiency    Crohn's disease ([place])    Diverticulosis    DYSLIPIDEMIA 06/28/2009   Qualifier: Diagnosis of  By: Loanne Drilling MD, Sean A    GERD 09/04/2006   Qualifier: Diagnosis of  By: Loanne Drilling MD, Sean A    Hepatic steatosis    Hypertension    HYPOGONADISM, MALE 05/20/2008   Qualifier: Diagnosis of  By: Loanne Drilling MD, Sean A    Internal hemorrhoids    KNEE PAIN 03/24/2007   Qualifier: Diagnosis of  By: Loanne Drilling MD, Sean A    Ventral hernia    Past Surgical History:  Procedure Laterality Date   APPENDECTOMY     ELECTROCARDIOGRAM  02/01/2006   EP IMPLANTABLE DEVICE N/A 06/09/2015   Procedure: Pacemaker Implant;  Surgeon: Will Meredith Leeds, MD;  Location: [facility];  Service: Cardiovascular;  Laterality: N/A;   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   Left   Rest Cardiolite  11/21/2000   Social History   Socioeconomic History   Marital status: Divorced    Spouse name: Not on file   Number of children: Not on file   Years of education: Not on file   Highest  education level: Not on file  Occupational History   Occupation: Theme park manager  Tobacco Use   Smoking status: Former    Types: Cigarettes   Smokeless tobacco: Never  Vaping Use   Vaping Use: Never used  Substance and Sexual Activity   Alcohol use: No   Drug use: No   Sexual activity: Not on file  Other Topics Concern   Not on file  Social History Narrative   Not on file   Social Determinants of Health   Financial Resource Strain: Not on file  Food Insecurity: Not on file  Transportation Needs: Not on file  Physical Activity: Not on file  Stress: Not on file  Social Connections: Not on file   Allergies  Allergen Reactions   Lidocaine Swelling    Throat swelling    Lovastatin     REACTION: Nausea   Shellfish Allergy Itching   Family History  Problem Relation Age of Onset   Stroke Father    Cancer Brother        Lung Cancer   Heart attack Brother    Colon cancer Neg Hx    Esophageal cancer Neg Hx    Pancreatic cancer Neg Hx    Stomach cancer Neg Hx     Current Outpatient Medications (Endocrine & Metabolic):  predniSONE (DELTASONE) 10 MG tablet, Take Prednisone 31m (1 tablet once daily for 6 weeks, then 535m(1/2 tablet) for 4 weeks, then stop   testosterone cypionate (DEPOTESTOTERONE CYPIONATE) 200 MG/ML injection, Inject 1 mL (200 mg total) into the muscle every 14 (fourteen) days.  Current Outpatient Medications (Cardiovascular):    amLODipine (NORVASC) 5 MG tablet, Take 5 mg by mouth daily.   metoprolol tartrate (LOPRESSOR) 25 MG tablet, Take 1 tablet (25 mg total) by mouth 2 (two) times daily.  Current Outpatient Medications (Respiratory):    albuterol (VENTOLIN HFA) 108 (90 Base) MCG/ACT inhaler, Inhale 2 puffs into the lungs every 6 (six) hours as needed for wheezing or shortness of breath.   Current Outpatient Medications (Hematological):    cyanocobalamin (,VITAMIN B-12,) 1000 MCG/ML injection, Inject 1 mL (1,000 mcg total) into the skin every 30 (thirty)  days. At PCP office  Current Outpatient Medications (Other):    Cholecalciferol (VITAMIN D-3) 125 MCG (5000 UT) TABS, Take 1 tablet by mouth daily at 2 PM.   omeprazole (PRILOSEC) 20 MG capsule, Take 1 capsule (20 mg total) by mouth daily.   PRESCRIPTION MEDICATION, Inhale into the lungs at bedtime. CPAP   Reviewed prior external information including notes and imaging from  primary care provider As well as notes that were available from care everywhere and other healthcare systems.  Past medical history, social, surgical and family history all reviewed in electronic medical record.  No pertanent information unless stated regarding to the chief complaint.   Review of Systems:  No headache, visual changes, nausea, vomiting, diarrhea, constipation, dizziness, abdominal pain, skin rash, fevers, chills, night sweats, weight loss, swollen lymph nodes,chest pain, shortness of breath, mood changes. POSITIVE muscle aches, body aches, joint swelling  Objective  Blood pressure 122/70, pulse 70, height 5' 3"  (1.6 m), weight 173 lb (78.5 kg), SpO2 (!) 87 %.   General: No apparent distress alert and oriented x3 mood and affect normal, dressed appropriately.  HEENT: Pupils equal, extraocular movements intact  Respiratory: Patient's speak in full sentences and does not appear short of breath  Cardiovascular: No lower extremity edema, non tender, no erythema  Severely antalgic gait and using the aid of a cane to walk.  The patient has 0 degrees of internal rotation of the hip.  Only has 30 degrees even flexion with significant pain on the anterior lateral aspect of the hip.  Mild positive fulcrum test noted as well.  Neurovascularly intact distally.  Right knee exam shows the patient does have a trace effusion of the patellofemoral joint.  No crepitus noted.  Mild instability with valgus and varus force.  After informed written and verbal consent, patient was seated on exam table. Right knee was prepped  with alcohol swab and utilizing anterolateral approach, patient's right knee space was injected with 4:1  marcaine 0.5%: Kenalog 4042mL. Patient tolerated the procedure well without immediate complications.    Impression and Recommendations:     The above documentation has been reviewed and is accurate and complete Maxwell Li

## 2021-10-17 ENCOUNTER — Ambulatory Visit: Payer: Medicare HMO | Admitting: Family Medicine

## 2021-10-17 ENCOUNTER — Encounter: Payer: Self-pay | Admitting: Family Medicine

## 2021-10-17 ENCOUNTER — Ambulatory Visit: Payer: Self-pay

## 2021-10-17 ENCOUNTER — Ambulatory Visit (INDEPENDENT_AMBULATORY_CARE_PROVIDER_SITE_OTHER): Payer: Medicare HMO

## 2021-10-17 VITALS — BP 122/70 | HR 70 | Ht 63.0 in | Wt 173.0 lb

## 2021-10-17 DIAGNOSIS — M25561 Pain in right knee: Secondary | ICD-10-CM

## 2021-10-17 DIAGNOSIS — M25552 Pain in left hip: Secondary | ICD-10-CM

## 2021-10-17 DIAGNOSIS — M1711 Unilateral primary osteoarthritis, right knee: Secondary | ICD-10-CM | POA: Diagnosis not present

## 2021-10-17 DIAGNOSIS — M1612 Unilateral primary osteoarthritis, left hip: Secondary | ICD-10-CM | POA: Diagnosis not present

## 2021-10-17 NOTE — Assessment & Plan Note (Signed)
Patient does have moderate arthritic changes of the knee but nothing that would need any surgical intervention.  Likely some exacerbation with patient compensating for the severe left hip arthritis.  Injection given today to see if this would help with some of the discomfort and immediately was able to walk a little better.  We discussed the possibility of viscosupplementation but we will have patient follow-up again in 6 to 8 weeks to further evaluate

## 2021-10-17 NOTE — Patient Instructions (Addendum)
Tart cherry Extract 1242m Vit D 2000IU Injection in R knee today Referral to Dr. BNinfa Linden

## 2021-10-17 NOTE — Assessment & Plan Note (Signed)
Severe left hip arthritis noted.  Patient has bone-on-bone osteoarthritic changes noted.  Starting to get some mild thinning of the acetabulum and some abnormal bending also noted of the anatomical neck it appears secondary to the strain and stress on the femoral head.  At this point I do think secondary to how much this is bothering him with pain, affecting daily activities I do think surgical intervention would be the most beneficial.  Patient will be referred to orthopedic surgery to discuss treatment options.

## 2021-10-31 ENCOUNTER — Encounter: Payer: Self-pay | Admitting: Orthopaedic Surgery

## 2021-10-31 ENCOUNTER — Ambulatory Visit (INDEPENDENT_AMBULATORY_CARE_PROVIDER_SITE_OTHER): Payer: Medicare HMO | Admitting: Orthopaedic Surgery

## 2021-10-31 ENCOUNTER — Telehealth: Payer: Self-pay | Admitting: Orthopaedic Surgery

## 2021-10-31 VITALS — Ht 63.0 in | Wt 173.0 lb

## 2021-10-31 DIAGNOSIS — M1612 Unilateral primary osteoarthritis, left hip: Secondary | ICD-10-CM | POA: Diagnosis not present

## 2021-10-31 NOTE — Telephone Encounter (Signed)
Pt called stating that he called his cardiologist to set appt to get clearance for surgery. He states his cardiologist request [place] to fax a clearance for his surgery. Please call pt about this matter at [phone].

## 2021-10-31 NOTE — Progress Notes (Signed)
Office Visit Note   Patient: Maxwell Li           Date of Birth: 02-13-1955           MRN: [id number] Visit Date: 10/31/2021              Requested by: Lyndal Pulley, DO [place],  [place] [address] PCP: Rubie Maid, MD   Assessment & Plan: Visit Diagnoses:  1. Unilateral primary osteoarthritis, left hip     Plan: Given the patient's severe pain and radiographic findings recommend left total hip arthroplasty.  Risk benefits of surgery discussed with the patient.  He will need cardiac clearance.  Risk faction, wound healing problems, blood loss, nerve vessel injury, DVT PE all discussed with patient.  We will have him see his cardiologist for clearance and once he is cleared we will get him on the schedule for a left total hip arthroplasty.  Postoperative protocol discussed.  Questions encouraged and answered by Dr. Ninfa Linden myself.  Handout on anterior hip replacement was given.  Follow-Up Instructions: Return for post op.   Orders:  No orders of the defined types were placed in this encounter.  No orders of the defined types were placed in this encounter.     Procedures: No procedures performed   Clinical Data: No additional findings.   Subjective: Chief Complaint  Patient presents with   Left Hip - Pain    HPI [name] 66 year old male were seen for the first time for left hip pain.  He reports that he has had pain in his left hip for the past 3 to 4 years however over the last 6 months it became such that he is having to use a cane to ambulate.  He is having difficulty donning shoes and socks due to the pain and decreased range of motion of the left hip.  No known injury to the left hip.  Rates his pain to be 8 out of 10 pain which is constant pain in the groin region.  Has had no real treatment other than taking Tylenol for the pain.  Does have a history of herniated surgery, pacemaker placement and mastoid tumor removal without  any complications.  Medical history pertinent for Crohn's disease, pacemaker, leukopenia, hypocalcemia, sick sinus syndrome and AV block.  He states that he is being worked up for possible CLL.  He denies any chest pain shortness of breath.  States its been sometime since he seen his cardiologist.  Denies any ongoing infections.  He does note that he has floaters particularly in his right eye.  Denies any headaches. Radiographs AP pelvis lateral view of the left hip dated 10/17/2021 showed end-stage arthritis of the left hip with near bone-on-bone.  Cam type impingement.  No acute fractures bony abnormalities otherwise.  Right hip overall well-maintained  Review of Systems  Constitutional:  Negative for chills and fever.  HENT:  Negative for dental problem.   Eyes:  Positive for visual disturbance.  Respiratory:  Negative for shortness of breath.   Cardiovascular:  Negative for chest pain.  Musculoskeletal:  Positive for gait problem.     Objective: Vital Signs: Ht 5' 3"  (1.6 m)   Wt 173 lb (78.5 kg)   BMI 30.65 kg/m   Physical Exam Constitutional:      Appearance: He is not ill-appearing or diaphoretic.  Neurological:     Mental Status: He is alert and oriented to person, place, and time.  Psychiatric:  Mood and Affect: Mood normal.     Ortho Exam Left hip diminished internal/external rotation with pain.  Right hip full range of motion without pain.  Calves are supple and nontender bilaterally.  Ambulates with the cane. Specialty Comments:  No specialty comments available.  Imaging: No results found.   PMFS History: Patient Active Problem List   Diagnosis Date Noted   Arthritis of left hip 10/17/2021   Degenerative arthritis of right knee 10/17/2021   Crohn's disease of small and large intestines with complication ([place]) 69/79/4801   Crohn's colitis, with intestinal obstruction ([place]) 06/10/2021   DNR (do not resuscitate) 06/10/2021   Hypogonadism in male 03/15/2017    Postural dizziness with presyncope 03/15/2017   Crohn disease ([place]) 03/15/2017   OSA on CPAP 03/15/2017   Essential hypertension 03/15/2017   Leukopenia 03/15/2017   Hypocalcemia 03/15/2017   Hypokalemia 03/15/2017   AV block, complete ([place])    Sick sinus syndrome ([place]) 06/08/2015   Other abnormal blood chemistry 06/24/2012   Encounter for long-term (current) use of other medications 06/24/2012   Screening for prostate cancer 06/24/2012   DYSLIPIDEMIA 06/28/2009   HYPOGONADISM, MALE 05/20/2008   KNEE PAIN 03/24/2007   ABDOMINAL PAIN, UNSPECIFIED SITE 03/24/2007   GERD 09/04/2006   Past Medical History:  Diagnosis Date   Adenomatous colon polyp    B12 deficiency    Crohn's disease ([place])    Diverticulosis    DYSLIPIDEMIA 06/28/2009   Qualifier: Diagnosis of  By: Loanne Drilling MD, Sean A    GERD 09/04/2006   Qualifier: Diagnosis of  By: Loanne Drilling MD, Sean A    Hepatic steatosis    Hypertension    HYPOGONADISM, MALE 05/20/2008   Qualifier: Diagnosis of  By: Loanne Drilling MD, Sean A    Internal hemorrhoids    KNEE PAIN 03/24/2007   Qualifier: Diagnosis of  By: Loanne Drilling MD, Sean A    Ventral hernia     Family History  Problem Relation Age of Onset   Stroke Father    Cancer Brother        Lung Cancer   Heart attack Brother    Colon cancer Neg Hx    Esophageal cancer Neg Hx    Pancreatic cancer Neg Hx    Stomach cancer Neg Hx     Past Surgical History:  Procedure Laterality Date   APPENDECTOMY     ELECTROCARDIOGRAM  02/01/2006   EP IMPLANTABLE DEVICE N/A 06/09/2015   Procedure: Pacemaker Implant;  Surgeon: Will Meredith Leeds, MD;  Location: [facility];  Service: Cardiovascular;  Laterality: N/A;   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   Left   Rest Cardiolite  11/21/2000   Social History   Occupational History   Occupation: Theme park manager  Tobacco Use   Smoking status: Former    Types: Cigarettes   Smokeless tobacco: Never  Vaping Use   Vaping Use: Never used  Substance  and Sexual Activity   Alcohol use: No   Drug use: No   Sexual activity: Not on file

## 2021-11-02 ENCOUNTER — Ambulatory Visit (INDEPENDENT_AMBULATORY_CARE_PROVIDER_SITE_OTHER): Payer: Medicare HMO

## 2021-11-02 VITALS — BP 117/80 | HR 80 | Temp 98.8°F | Resp 18 | Ht 63.0 in | Wt 171.8 lb

## 2021-11-02 DIAGNOSIS — K50819 Crohn's disease of both small and large intestine with unspecified complications: Secondary | ICD-10-CM | POA: Diagnosis not present

## 2021-11-02 MED ORDER — RISANKIZUMAB-RZAA 600 MG/10ML IV SOLN
600.0000 mg | Freq: Once | INTRAVENOUS | Status: AC
  Administered 2021-11-02: 600 mg via INTRAVENOUS
  Filled 2021-11-02: qty 10

## 2021-11-02 NOTE — Progress Notes (Signed)
Diagnosis: Crohn's Disease  Provider:  Marshell Garfinkel MD  Procedure: Infusion  IV Type: Peripheral, IV Location: L Hand  Skyrizi (risankizumab-rzaa), Dose: 600 mg  Infusion Start Time: 0939  Infusion Stop Time: 9122  Post Infusion IV Care: Peripheral IV Discontinued  Discharge: Condition: Good, Destination: Home . AVS provided to patient.   Performed by:  Adelina Mings, LPN

## 2021-11-06 ENCOUNTER — Telehealth: Payer: Self-pay | Admitting: Cardiology

## 2021-11-06 NOTE — Telephone Encounter (Signed)
I called the pt and informed him that I have not seen a clearance request come over yet from Dr. Marny Lowenstein office. I assured the pt that I will take care of this and call surgeon office and get the information needed. Once he has been cleared we will let hi, know. Pt said thank you so much for the help.

## 2021-11-06 NOTE — Telephone Encounter (Signed)
   Pre-operative Risk Assessment    Patient Name: Maxwell Li  DOB: 1955/09/17 MRN: [id number]      Request for Surgical Clearance    Procedure:   LEFT TOTAL HIP ARTHROPLASTY  Date of Surgery:  Clearance TBD                                 Surgeon:  DR. Jean Rosenthal Surgeon's Group or Practice Name:  [facility] Phone number:  [phone] Fax number:  [phone] ATTN: Dondra Spry BILLINGS   Type of Clearance Requested:   - Medical ; NO MEDICATIONS LISTED AS NEEDING TO BE HELD   Type of Anesthesia:  Spinal   Additional requests/questions:    Jiles Prows   11/06/2021, 3:16 PM

## 2021-11-06 NOTE — Telephone Encounter (Signed)
Follow Up:      Patient is calling to check on the status of his clearance.

## 2021-11-06 NOTE — Telephone Encounter (Signed)
Faxed to [facility].

## 2021-11-07 NOTE — Telephone Encounter (Signed)
Pt is going to need a new pt appt, aas he was last seen 02/2017.   Left a message for the pt that he needs to call back for new pt appt for pre op clearance with Dr. Percival Spanish or another gen card MD.

## 2021-11-07 NOTE — Telephone Encounter (Signed)
   Name: Maxwell Li  DOB: 19-Oct-1955  MRN: [id number]  Primary Cardiologist: None  Chart reviewed as part of pre-operative protocol coverage. Because of [name]'s past medical history and time since last visit (last office visit 02/08/2017), he will require a follow-up in-office visit in order to better assess preoperative cardiovascular risk.  Pre-op covering staff: - Please schedule appointment and call patient to inform them. If patient already had an upcoming appointment within acceptable timeframe, please add "pre-op clearance" to the appointment notes so provider is aware. - Please contact requesting surgeon's office via preferred method (i.e, phone, fax) to inform them of need for appointment prior to surgery.    Christell Faith, PA-C  11/07/2021, 8:15 AM

## 2021-11-08 NOTE — Telephone Encounter (Signed)
I s/w the pt and stated that he needs a new pt appt since he was last seen 2019. Pt states he no longer follows gen card and just follows EP. He will need still a new pt EP appt. I saw the first appt new pt was 12/08/21. I stated that I will have the EP scheduler call him tomorrow to try and get him in sooner for pre op clearance. Pt is very grateful for our help.

## 2021-11-14 NOTE — Telephone Encounter (Signed)
Pt has appt 12/06/21 with Tommye Standard, PAC. I have added need pre op clearance as well to appt notes.

## 2021-11-14 NOTE — Telephone Encounter (Signed)
Pt has

## 2021-11-24 NOTE — Progress Notes (Deleted)
[place] Phone: [phone] Subjective:    I'm seeing this patient by the request  of:  Rubie Maid, MD  CC:   [id number]  10/17/2021 Patient does have moderate arthritic changes of the knee but nothing that would need any surgical intervention.  Likely some exacerbation with patient compensating for the severe left hip arthritis.  Injection given today to see if this would help with some of the discomfort and immediately was able to walk a little better.  We discussed the possibility of viscosupplementation but we will have patient follow-up again in 6 to 8 weeks to further evaluate   Severe left hip arthritis noted.  Patient has bone-on-bone osteoarthritic changes noted.  Starting to get some mild thinning of the acetabulum and some abnormal bending also noted of the anatomical neck it appears secondary to the strain and stress on the femoral head.  At this point I do think secondary to how much this is bothering him with pain, affecting daily activities I do think surgical intervention would be the most beneficial.  Patient will be referred to orthopedic surgery to discuss treatment options.      Update 11/29/2021 Maxwell Li is a 66 y.o. male coming in with complaint of R knee and L hip pain. Saw Dr. Ninfa Linden since last visit. Patient states        Past Medical History:  Diagnosis Date   Adenomatous colon polyp    B12 deficiency    Crohn's disease ([place])    Diverticulosis    DYSLIPIDEMIA 06/28/2009   Qualifier: Diagnosis of  By: Loanne Drilling MD, Sean A    GERD 09/04/2006   Qualifier: Diagnosis of  By: Loanne Drilling MD, Sean A    Hepatic steatosis    Hypertension    HYPOGONADISM, MALE 05/20/2008   Qualifier: Diagnosis of  By: Loanne Drilling MD, Sean A    Internal hemorrhoids    KNEE PAIN 03/24/2007   Qualifier: Diagnosis of  By: Loanne Drilling MD, Sean A    Ventral hernia    Past Surgical History:   Procedure Laterality Date   APPENDECTOMY     ELECTROCARDIOGRAM  02/01/2006   EP IMPLANTABLE DEVICE N/A 06/09/2015   Procedure: Pacemaker Implant;  Surgeon: Will Meredith Leeds, MD;  Location: [facility];  Service: Cardiovascular;  Laterality: N/A;   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   Left   Rest Cardiolite  11/21/2000   Social History   Socioeconomic History   Marital status: Divorced    Spouse name: Not on file   Number of children: Not on file   Years of education: Not on file   Highest education level: Not on file  Occupational History   Occupation: Theme park manager  Tobacco Use   Smoking status: Former    Types: Cigarettes   Smokeless tobacco: Never  Vaping Use   Vaping Use: Never used  Substance and Sexual Activity   Alcohol use: No   Drug use: No   Sexual activity: Not on file  Other Topics Concern   Not on file  Social History Narrative   Not on file   Social Determinants of Health   Financial Resource Strain: Not on file  Food Insecurity: Not on file  Transportation Needs: Not on file  Physical Activity: Not on file  Stress: Not on file  Social Connections: Not on file   Allergies  Allergen Reactions   Lidocaine Swelling    Throat swelling  Lovastatin     REACTION: Nausea   Shellfish Allergy Itching   Family History  Problem Relation Age of Onset   Stroke Father    Cancer Brother        Lung Cancer   Heart attack Brother    Colon cancer Neg Hx    Esophageal cancer Neg Hx    Pancreatic cancer Neg Hx    Stomach cancer Neg Hx     Current Outpatient Medications (Endocrine & Metabolic):    predniSONE (DELTASONE) 10 MG tablet, Take Prednisone 40m (1 tablet once daily for 6 weeks, then 562m(1/2 tablet) for 4 weeks, then stop   testosterone cypionate (DEPOTESTOTERONE CYPIONATE) 200 MG/ML injection, Inject 1 mL (200 mg total) into the muscle every 14 (fourteen) days.  Current Outpatient Medications (Cardiovascular):    amLODipine (NORVASC) 5 MG  tablet, Take 5 mg by mouth daily.   metoprolol tartrate (LOPRESSOR) 25 MG tablet, Take 1 tablet (25 mg total) by mouth 2 (two) times daily.  Current Outpatient Medications (Respiratory):    albuterol (VENTOLIN HFA) 108 (90 Base) MCG/ACT inhaler, Inhale 2 puffs into the lungs every 6 (six) hours as needed for wheezing or shortness of breath.   Current Outpatient Medications (Hematological):    cyanocobalamin (,VITAMIN B-12,) 1000 MCG/ML injection, Inject 1 mL (1,000 mcg total) into the skin every 30 (thirty) days. At PCP office   cyanocobalamin (VITAMIN B12) 1000 MCG/ML injection, Inject into the muscle.  Current Outpatient Medications (Other):    Cholecalciferol (VITAMIN D-3) 125 MCG (5000 UT) TABS, Take 1 tablet by mouth daily at 2 PM.   omeprazole (PRILOSEC) 20 MG capsule, Take 1 capsule (20 mg total) by mouth daily.   PRESCRIPTION MEDICATION, Inhale into the lungs at bedtime. CPAP   Reviewed prior external information including notes and imaging from  primary care provider As well as notes that were available from care everywhere and other healthcare systems.  Past medical history, social, surgical and family history all reviewed in electronic medical record.  No pertanent information unless stated regarding to the chief complaint.   Review of Systems:  No headache, visual changes, nausea, vomiting, diarrhea, constipation, dizziness, abdominal pain, skin rash, fevers, chills, night sweats, weight loss, swollen lymph nodes, body aches, joint swelling, chest pain, shortness of breath, mood changes. POSITIVE muscle aches  Objective  There were no vitals taken for this visit.   General: No apparent distress alert and oriented x3 mood and affect normal, dressed appropriately.  HEENT: Pupils equal, extraocular movements intact  Respiratory: Patient's speak in full sentences and does not appear short of breath  Cardiovascular: No lower extremity edema, non tender, no erythema       Impression and Recommendations:

## 2021-11-27 HISTORY — PX: PARS PLANA VITRECTOMY: SHX2166

## 2021-11-29 ENCOUNTER — Ambulatory Visit: Payer: Medicare HMO | Admitting: Family Medicine

## 2021-12-05 NOTE — Progress Notes (Unsigned)
Cardiology Office Note Date:  02/08/2017  Patient ID:  BASHIR MARCHETTI, DOB 22-Jun-1955, MRN [id number] PCP:  Rubie Maid, MD  Cardiologist:  Dr. Percival Spanish Electrophysiologist: Dr. Curt Bears    Chief Complaint: *** over due, needs pre-op evaluation  History of Present Illness: ZAYVIAN MCMURTRY is a 66 y.o. male with history of HTN, HLD, GERD, Chron'shypogonadism, advanced heart block w/PPM.  He comes in today to be seen for Dr. Curt Bears, last seen by him in Sept 2017, at that time doing well, had 1:1 AT episodes that he was asymptomatic with, no changes were made to his tx.  He was cleared for a knee surgery in Feb 2018 by Dr. Percival Spanish.  I saw him in 2019 He is accompanied by his daughter.  He is doing well.  No CP, palpitations, no near syncoe or syncope, no SOB or exertional intolerances.  He c/w a movement type dizziness, particularly when laying on his R side or with quick movents of his head.  He reports being told historically that he had vertigo. BP poorly controlled Had some 1:1 AT on his device  Lopressor was increased   11/06/21: Pre-op pool for pre-op eval pending L hip surgery  *** symptoms *** BP  RCRI score is zero, 0.4% *** [facility]    Device information: SJM dual chamber PPM, implanted 06/09/15   Past Medical History:  Diagnosis Date   DYSLIPIDEMIA 06/28/2009   Qualifier: Diagnosis of  By: Loanne Drilling MD, Sean A    GERD 09/04/2006   Qualifier: Diagnosis of  By: Loanne Drilling MD, Sean A    Hypertension    HYPOGONADISM, MALE 05/20/2008   Qualifier: Diagnosis of  By: Loanne Drilling MD, Sean A    KNEE PAIN 03/24/2007   Qualifier: Diagnosis of  By: Loanne Drilling MD, Sean A    Ventral hernia     Past Surgical History:  Procedure Laterality Date   ELECTROCARDIOGRAM  02/01/2006   EP IMPLANTABLE DEVICE N/A 06/09/2015   Procedure: Pacemaker Implant;  Surgeon: Will Meredith Leeds, MD;  Location: [facility];  Service: Cardiovascular;  Laterality: N/A;   Mastoid Tumor Resect   1996   Left   Rest Cardiolite  11/21/2000    Current Outpatient Medications  Medication Sig Dispense Refill   ibuprofen (ADVIL,MOTRIN) 200 MG tablet Take 200 mg by mouth every 6 (six) hours as needed for moderate pain. Reported on 06/22/2015     metoprolol tartrate (LOPRESSOR) 25 MG tablet Take 2 tablets (50 mg total) by mouth 2 (two) times daily. 180 tablet 3   testosterone cypionate (DEPOTESTOTERONE CYPIONATE) 200 MG/ML injection Inject 1 mL (200 mg total) into the muscle every 14 (fourteen) days. 10 mL 0   No current facility-administered medications for this visit.     Allergies:   Lidocaine; Lovastatin; and Shellfish allergy   Social History:  The patient  reports that he has quit smoking. he has never used smokeless tobacco. He reports that he does not drink alcohol or use drugs.   Family History:  The patient's family history includes Cancer in his brother.  ROS:  Please see the history of present illness.   All other systems are reviewed and otherwise negative.   PHYSICAL EXAM:  VS:  Ht 5' 3"  (1.6 m)   Wt 191 lb (86.6 kg)   BMI 33.83 kg/m  BMI: Body mass index is 33.83 kg/m. Well nourished, well developed, in no acute distress  HEENT: normocephalic, atraumatic  Neck: no JVD, carotid bruits or masses Cardiac:  ***  RRR; no significant murmurs, no rubs, or gallops Lungs:  *** CTA b/l, no wheezing, rhonchi or rales  Abd: soft, non-tender, obese MS: no deformity or  atrophy Ext:  *** no edema  Skin: warm and dry, no rash Neuro:  No gross deficits appreciated Psych: euthymic mood, full affect  *** PPM site is stable, no tethering or discomfort   EKG:  done today and reviewed by me: ***  PPM interrogation done today and reviewed by myself:  ***   06/09/15: TTE Study Conclusions - Left ventricle: Abnormal septal motion The cavity size was   normal. Wall thickness was normal. Systolic function was normal.   The estimated ejection fraction was in the range of 50% to  55%.   Wall motion was normal; there were no regional wall motion   abnormalities. Left ventricular diastolic function parameters   were normal. - Atrial septum: A patent foramen ovale cannot be excluded.    Recent Labs: No results found for requested labs within last 8760 hours.  No results found for requested labs within last 8760 hours.   CrCl cannot be calculated (Patient's most recent lab result is older than the maximum 21 days allowed.).   Wt Readings from Last 3 Encounters:  02/08/17 191 lb (86.6 kg)  02/20/16 189 lb (85.7 kg)  09/14/15 187 lb (84.8 kg)     Other studies reviewed: Additional studies/records reviewed today include: summarized above  ASSESSMENT AND PLAN:  1. PPM     *** Intact function  2. HTN     ***  3. HLD     He follows with his PMD  4. AT noted via device     ***   5. Pre-op evaluation Hip surgery Low cardiac risk surgery Low cardiac risk score **** usual peri-operative PPM management, though typically with surgeries below umbilicus does not require any specific device management ***    Disposition: ***  Current medicines are reviewed at length with the patient today.  The patient did not have any concerns regarding medicines.  Venetia Night, PA-C 02/08/2017 11:51 AM     [address] [place] [address] [phone] (office)  [phone] (fax)

## 2021-12-06 ENCOUNTER — Ambulatory Visit: Payer: Medicare HMO | Attending: Physician Assistant | Admitting: Physician Assistant

## 2021-12-06 ENCOUNTER — Encounter: Payer: Self-pay | Admitting: Physician Assistant

## 2021-12-06 VITALS — BP 120/70 | HR 79 | Ht 63.0 in | Wt 171.0 lb

## 2021-12-06 DIAGNOSIS — I10 Essential (primary) hypertension: Secondary | ICD-10-CM | POA: Diagnosis not present

## 2021-12-06 DIAGNOSIS — I4510 Unspecified right bundle-branch block: Secondary | ICD-10-CM | POA: Diagnosis not present

## 2021-12-06 DIAGNOSIS — Z950 Presence of cardiac pacemaker: Secondary | ICD-10-CM | POA: Diagnosis not present

## 2021-12-06 DIAGNOSIS — Z01818 Encounter for other preprocedural examination: Secondary | ICD-10-CM

## 2021-12-06 DIAGNOSIS — I4719 Other supraventricular tachycardia: Secondary | ICD-10-CM

## 2021-12-06 LAB — CUP PACEART INCLINIC DEVICE CHECK
Battery Remaining Longevity: 66 mo
Battery Voltage: 2.99 V
Brady Statistic RA Percent Paced: 2.2 %
Brady Statistic RV Percent Paced: 1.9 %
Date Time Interrogation Session: 20231129180123
Implantable Lead Connection Status: 753985
Implantable Lead Connection Status: 753985
Implantable Lead Implant Date: 20170601
Implantable Lead Implant Date: 20170601
Implantable Lead Location: 753859
Implantable Lead Location: 753860
Implantable Pulse Generator Implant Date: 20170601
Lead Channel Impedance Value: 512.5 Ohm
Lead Channel Impedance Value: 550 Ohm
Lead Channel Pacing Threshold Amplitude: 0.625 V
Lead Channel Pacing Threshold Amplitude: 0.75 V
Lead Channel Pacing Threshold Amplitude: 0.75 V
Lead Channel Pacing Threshold Pulse Width: 0.5 ms
Lead Channel Pacing Threshold Pulse Width: 0.5 ms
Lead Channel Pacing Threshold Pulse Width: 0.5 ms
Lead Channel Sensing Intrinsic Amplitude: 11.2 mV
Lead Channel Sensing Intrinsic Amplitude: 4.5 mV
Lead Channel Setting Pacing Amplitude: 1.625
Lead Channel Setting Pacing Amplitude: 2.5 V
Lead Channel Setting Pacing Pulse Width: 0.5 ms
Lead Channel Setting Sensing Sensitivity: 2 mV
Pulse Gen Model: 2272
Pulse Gen Serial Number: 7904591

## 2021-12-06 NOTE — Patient Instructions (Addendum)
Medication Instructions:   Your physician recommends that you continue on your current medications as directed. Please refer to the Current Medication list given to you today.  *If you need a refill on your cardiac medications before your next appointment, please call your pharmacy*   Lab Work:  Maxwell Li    If you have labs (blood work) drawn today and your tests are completely normal, you will receive your results only by: [place] (if you have MyChart) OR A paper copy in the mail If you have any lab test that is abnormal or we need to change your treatment, we will call you to review the results.   Testing/Procedures:  NONE ORDERED  TODAY    Follow-Up: At [facility], you and your health needs are our priority.  As part of our continuing mission to provide you with exceptional heart care, we have created designated Provider Care Teams.  These Care Teams include your primary Cardiologist (physician) and Advanced Practice Providers (APPs -  Physician Assistants and Nurse Practitioners) who all work together to provide you with the care you need, when you need it.  We recommend signing up for the patient portal called "MyChart".  Sign up information is provided on this After Visit Summary.  MyChart is used to connect with patients for Virtual Visits (Telemedicine).  Patients are able to view lab/test results, encounter notes, upcoming appointments, etc.  Non-urgent messages can be sent to your provider as well.   To learn more about what you can do with MyChart, go to [email or web address].    Your next appointment:   1 year(s)  The format for your next appointment:   In Person  Provider:   You may see  Dr Curt Bears      Other Instructions   Important Information About Sugar

## 2021-12-18 ENCOUNTER — Encounter: Payer: Self-pay | Admitting: *Deleted

## 2021-12-20 ENCOUNTER — Ambulatory Visit (INDEPENDENT_AMBULATORY_CARE_PROVIDER_SITE_OTHER): Payer: Medicare HMO

## 2021-12-20 DIAGNOSIS — I495 Sick sinus syndrome: Secondary | ICD-10-CM

## 2021-12-20 LAB — CUP PACEART REMOTE DEVICE CHECK
Battery Remaining Longevity: 59 mo
Battery Remaining Percentage: 47 %
Battery Voltage: 2.99 V
Brady Statistic AP VP Percent: 1 %
Brady Statistic AP VS Percent: 2.6 %
Brady Statistic AS VP Percent: 1 %
Brady Statistic AS VS Percent: 96 %
Brady Statistic RA Percent Paced: 2.6 %
Brady Statistic RV Percent Paced: 1 %
Date Time Interrogation Session: 20231213020013
Implantable Lead Connection Status: 753985
Implantable Lead Connection Status: 753985
Implantable Lead Implant Date: 20170601
Implantable Lead Implant Date: 20170601
Implantable Lead Location: 753859
Implantable Lead Location: 753860
Implantable Pulse Generator Implant Date: 20170601
Lead Channel Impedance Value: 510 Ohm
Lead Channel Impedance Value: 540 Ohm
Lead Channel Pacing Threshold Amplitude: 0.75 V
Lead Channel Pacing Threshold Amplitude: 0.75 V
Lead Channel Pacing Threshold Pulse Width: 0.5 ms
Lead Channel Pacing Threshold Pulse Width: 0.5 ms
Lead Channel Sensing Intrinsic Amplitude: 4.7 mV
Lead Channel Sensing Intrinsic Amplitude: 8.2 mV
Lead Channel Setting Pacing Amplitude: 1.75 V
Lead Channel Setting Pacing Amplitude: 2.5 V
Lead Channel Setting Pacing Pulse Width: 0.5 ms
Lead Channel Setting Sensing Sensitivity: 2 mV
Pulse Gen Model: 2272
Pulse Gen Serial Number: 7904591

## 2021-12-21 ENCOUNTER — Other Ambulatory Visit (HOSPITAL_COMMUNITY): Payer: Self-pay

## 2021-12-21 ENCOUNTER — Telehealth: Payer: Self-pay | Admitting: Internal Medicine

## 2021-12-21 ENCOUNTER — Telehealth: Payer: Self-pay | Admitting: Pharmacy Technician

## 2021-12-21 NOTE — Telephone Encounter (Signed)
Pt received last skyrizi infusion on 10/26. He is supposed to do the on body injector 333m every 8 weeks and should start on 12/21. [facility] but not sure where the break down for the injection happened. He needs to start on this 12/28/21. Can you please help with the prior auth for this asap?

## 2021-12-21 NOTE — Telephone Encounter (Signed)
Submitted Patient Assistance Application to [place] for [facility] along with provider portion, PA and income documents. Will update patient when we receive a response.  Fax# [phone] Phone# [phone]

## 2021-12-21 NOTE — Telephone Encounter (Signed)
Correction the pt should have taken his first injection on 11/23. I see notes where the PA was approved but also saw some patient assistance forms under media. The pt states he has not heard anything about the skyrizi. Please let me know what needs to be done to facilitate this for the pt.

## 2021-12-21 NOTE — Telephone Encounter (Signed)
Patient is calling about skyrizi injections. Was told that he would need them over next 8 weeks per 10/3 OV. He hasn't heard from anyone as far as an appointment. PCP says that we have to initiate appointment. Please advise.

## 2021-12-25 NOTE — Telephone Encounter (Signed)
Any update on pts skyrizi?

## 2021-12-26 ENCOUNTER — Telehealth: Payer: Self-pay

## 2021-12-26 NOTE — Telephone Encounter (Signed)
Can someone provide an update regarding his skyrizi injection approval?

## 2022-01-02 ENCOUNTER — Ambulatory Visit (INDEPENDENT_AMBULATORY_CARE_PROVIDER_SITE_OTHER): Payer: Medicare HMO | Admitting: Internal Medicine

## 2022-01-02 ENCOUNTER — Encounter: Payer: Self-pay | Admitting: Internal Medicine

## 2022-01-02 VITALS — BP 160/90 | HR 79 | Ht 63.0 in | Wt 175.0 lb

## 2022-01-02 DIAGNOSIS — E538 Deficiency of other specified B group vitamins: Secondary | ICD-10-CM

## 2022-01-02 DIAGNOSIS — K50819 Crohn's disease of both small and large intestine with unspecified complications: Secondary | ICD-10-CM

## 2022-01-02 MED ORDER — CYANOCOBALAMIN 1000 MCG/ML IJ SOLN: 1000.0000 ug | INTRAMUSCULAR | 0 refills | Status: AC

## 2022-01-02 MED ORDER — OMEPRAZOLE 20 MG PO CPDR: 20.0000 mg | DELAYED_RELEASE_CAPSULE | Freq: Every day | ORAL | 1 refills | Status: DC

## 2022-01-02 NOTE — Progress Notes (Signed)
Subjective:    Patient ID: Maxwell Li, male    DOB: 07/30/1955, 66 y.o.   MRN: [id number]  HPI Maxwell Li is a 66 year old male with a history of Crohn's disease of the small and large intestine (diagnosis 2015 in [facility]), previously treated with sulfasalazine, hospitalization for the same in June 2023, 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 Lorenso Courier, hypertension, hyperlipidemia, sleep apnea who is seen for follow-up. He is here alone today and was last in the office in October 2023.  He started [place] in late August 2023.  He has completed induction IV infusion with Skyrizi with his last dose on 11/02/2021.  He was due for his first on body injector on the 21st but this has not been received through his insurance yet.  He reports he is definitively feeling better.  He has weaned down and has 4 days of 5 mg prednisone left.  His diarrhea is definitively better and his abdominal pain is also better but not gone.  He has some right-sided abdominal discomfort and bloating symptom.  Loose stools can occur after eating but again looser stools have improved.  He is not seeing blood in the stool.  He has left hip replacement scheduled for 02/16/2022. He continues his B12 injection monthly and oral supplement daily as well as his vitamin D3.   Review of Systems As per HPI, otherwise negative  Current Medications, Allergies, Past Medical History, Past Surgical History, Family History and Social History were reviewed in [employer] record.    Objective:   Physical Exam BP (!) 160/90   Pulse 79   Ht 5' 3"  (1.6 m)   Wt 175 lb (79.4 kg)   SpO2 96%   BMI 31.00 kg/m  Gen: awake, alert, NAD HEENT: anicteric Abd: soft, mildly tender in the right abdomen without rebound or guarding, nondistended today,  +BS throughout Ext: no c/c/e Neuro: nonfocal     Latest Ref Rng & Units 10/10/2021    4:51 PM 07/14/2021    8:48 AM 07/07/2021   10:43 AM  CBC  WBC 4.0 -  10.5 K/uL 2.7  2.8  5.1   Hemoglobin 13.0 - 17.0 g/dL 15.1  15.3  14.4   Hematocrit 39.0 - 52.0 % 45.3  44.5  43.9   Platelets 150.0 - 400.0 K/uL 134.0  122  124.0    CMP     Component Value Date/Time   NA 135 10/10/2021 1651   K 3.9 10/10/2021 1651   CL 95 (L) 10/10/2021 1651   CO2 31 10/10/2021 1651   GLUCOSE 90 10/10/2021 1651   BUN 6 10/10/2021 1651   CREATININE 0.64 10/10/2021 1651   CREATININE 0.76 07/14/2021 0848   CALCIUM 9.3 10/10/2021 1651   PROT 7.2 10/10/2021 1651   ALBUMIN 3.9 10/10/2021 1651   AST 11 10/10/2021 1651   AST 14 (L) 07/14/2021 0848   ALT 5 10/10/2021 1651   ALT 19 07/14/2021 0848   ALKPHOS 71 10/10/2021 1651   BILITOT 0.4 10/10/2021 1651   BILITOT 0.9 07/14/2021 0848   GFRNONAA >60 07/14/2021 0848   GFRAA >60 03/16/2017 0545        Assessment & Plan:  66 year old male with a history of Crohn's disease of the small and large intestine (diagnosis 2015 in [place]), previously treated with sulfasalazine, hospitalization for the same in June 2023, 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 Lorenso Courier, hypertension, hyperlipidemia, sleep apnea who is seen for follow-up.   Crohn's  ileocolitis; Skyrizi initiation September 07, 2021 --symptoms are better than they were prior to [facility] but he is now slightly overdue for his subcutaneous injection.  We need to try to get this approved and available to him ASAP. -- Continue Skyrizi 360 mg subcutaneous on body injector dose every 8 weeks beginning ASAP -- Continue prednisone 5 mg daily until follow-up with me in 8 to 12 weeks; this is being continued for now because we do not have Skyrizi on hand for him -- Check fecal calprotectin at follow-up  2.  B12 deficiency --continue monthly IM B12 and daily oral B12  3.  Chronic leukopenia --hematology feels secondary to active Crohn's inflammation.  See #1.  Remain off sulfasalazine.  4.  History of colon polyps --surveillance colonoscopy recommended around August  2025  30 minutes total spent today including patient facing time, coordination of care, reviewing medical history/procedures/pertinent radiology studies, and documentation of the encounter.

## 2022-01-02 NOTE — Patient Instructions (Addendum)
Continue prednisone 5 mg until you return to our office continue B12, omeprazole  Please call to schedule 3 month follow up visit _______________________________________________________  If you are age 66 or older, your body mass index should be between 23-30. Your Body mass index is 31 kg/m. If this is out of the aforementioned range listed, please consider follow up with your Primary Care Provider.  If you are age 66 or younger, your body mass index should be between 19-25. Your Body mass index is 31 kg/m. If this is out of the aformentioned range listed, please consider follow up with your Primary Care Provider.   ________________________________________________________  The [facility] providers would like to encourage you to use [facility] to communicate with providers for non-urgent requests or questions.  Due to long hold times on the telephone, sending your provider a message by [facility] may be a faster and more efficient way to get a response.  Please allow 48 business hours for a response.  Please remember that this is for non-urgent requests.  _______________________________________________________    Thank you for entrusting me with your care and choosing [facility].  Dr Hilarie Fredrickson

## 2022-01-03 ENCOUNTER — Other Ambulatory Visit (HOSPITAL_COMMUNITY): Payer: Self-pay

## 2022-01-03 ENCOUNTER — Other Ambulatory Visit: Payer: Self-pay | Admitting: *Deleted

## 2022-01-03 MED ORDER — PREDNISONE 5 MG PO TABS: 5.0000 mg | ORAL_TABLET | Freq: Every day | ORAL | 2 refills | Status: AC

## 2022-01-03 NOTE — Telephone Encounter (Signed)
Called and spoke with rep again, and application has not been processed yet. Rep said they are experiencing delays due to renewal season and has been exceeding the the 7-10 day turnaround time.

## 2022-01-09 NOTE — Telephone Encounter (Signed)
Any update on Skyrizi?

## 2022-01-10 ENCOUNTER — Telehealth: Payer: Self-pay | Admitting: Pharmacy Technician

## 2022-01-10 NOTE — Telephone Encounter (Signed)
Any update on his skyrizi?

## 2022-01-10 NOTE — Telephone Encounter (Signed)
Patient Advocate Encounter  Received notification from [facility] that prior authorization for SKYRIZI '360MG'$  is required.   PA submitted on 1.3.24 Key [id number] Status is pending

## 2022-01-11 ENCOUNTER — Other Ambulatory Visit (HOSPITAL_COMMUNITY): Payer: Self-pay

## 2022-01-11 NOTE — Telephone Encounter (Signed)
Patient Advocate Encounter  Prior Authorization for [place] has been approved.    PA# [id number] Effective dates: 1.1.24 through 12.31.24

## 2022-01-15 NOTE — Telephone Encounter (Signed)
Any update?

## 2022-01-16 NOTE — Progress Notes (Signed)
Remote pacemaker transmission.   

## 2022-01-16 NOTE — Telephone Encounter (Signed)
Spoke with pt and he is aware of approval. Pt given the number to call to set up shipment.

## 2022-01-16 NOTE — Telephone Encounter (Signed)
WAITING on a fax from  [place] regarding an approval for [facility] patient assistance from 1.9.24 to 12.31.24.    Phone number: [phone]

## 2022-01-17 ENCOUNTER — Other Ambulatory Visit: Payer: Self-pay

## 2022-01-25 ENCOUNTER — Other Ambulatory Visit: Payer: Self-pay | Admitting: Physician Assistant

## 2022-01-25 DIAGNOSIS — Z01818 Encounter for other preprocedural examination: Secondary | ICD-10-CM

## 2022-01-31 ENCOUNTER — Encounter: Payer: Self-pay | Admitting: Cardiology

## 2022-01-31 NOTE — Progress Notes (Addendum)
COVID Vaccine Completed:  Date of COVID positive in last 90 days:  PCP - Rubie Maid, MD Cardiologist - Minus Breeding, MD  Cardiac clearance by Tommye Standard 12/06/21 in Epic   Chest x-ray - 06/11/21 Epic EKG - 12/06/21 Epic Stress Test -  ECHO - 2017 Cardiac Cath -  Pacemaker/ICD device last checked: 12/20/21 Epic- orders requested  Spinal Cord Stimulator:  Bowel Prep -   Sleep Study -  CPAP -   Fasting Blood Sugar -  Checks Blood Sugar _____ times a day  Last dose of GLP1 agonist-  N/A GLP1 instructions:  N/A   Last dose of SGLT-2 inhibitors-  N/A SGLT-2 instructions: N/A   Blood Thinner Instructions: Aspirin Instructions: Last Dose:  Activity level:  Can go up a flight of stairs and perform activities of daily living without stopping and without symptoms of chest pain or shortness of breath.  Able to exercise without symptoms  Unable to go up a flight of stairs without symptoms of     Anesthesia review: pacemaker, DD, AV block, Pacemaker, HTN, OSA  Patient denies shortness of breath, fever, cough and chest pain at PAT appointment  Patient verbalized understanding of instructions that were given to them at the PAT appointment. Patient was also instructed that they will need to review over the PAT instructions again at home before surgery.

## 2022-01-31 NOTE — Patient Instructions (Signed)
SURGICAL WAITING ROOM VISITATION  Patients having surgery or a procedure may have no more than 2 support people in the waiting area - these visitors may rotate.    Children under the age of 25 must have an adult with them who is not the patient.  Due to an increase in RSV and influenza rates and associated hospitalizations, children ages 7 and under may not visit patients in [facility] hospitals.  If the patient needs to stay at the hospital during part of their recovery, the visitor guidelines for inpatient rooms apply. Pre-op nurse will coordinate an appropriate time for 1 support person to accompany patient in pre-op.  This support person may not rotate.    Please refer to the [facility] website for the visitor guidelines for Inpatients (after your surgery is over and you are in a regular room).    Your procedure is scheduled on: 02/09/22   Report to [facility] Main Entrance    Report to admitting at 9:10 AM   Call this number if you have problems the morning of surgery [phone]   Do not eat food :After Midnight.   After Midnight you may have the following liquids until 8:40 AM DAY OF SURGERY  Water Non-Citrus Juices (without pulp, NO RED-Apple, White grape, White cranberry) Black Coffee (NO MILK/CREAM OR CREAMERS, sugar ok)  Clear Tea (NO MILK/CREAM OR CREAMERS, sugar ok) regular and decaf                             Plain Jell-O (NO RED)                                           Fruit ices (not with fruit pulp, NO RED)                                     Popsicles (NO RED)                                                               Sports drinks like Gatorade (NO RED)      The day of surgery:  Drink ONE (1) Pre-Surgery Clear Ensure at 8:40 AM the morning of surgery. Drink in one sitting. Do not sip.  This drink was given to you during your hospital  pre-op appointment visit. Nothing else to drink after completing the  Pre-Surgery Clear Ensure.           If you have questions, please contact your surgeon's office.   FOLLOW BOWEL PREP AND ANY ADDITIONAL PRE OP INSTRUCTIONS YOU RECEIVED FROM YOUR SURGEON'S OFFICE!!!     Oral Hygiene is also important to reduce your risk of infection.                                    Remember - BRUSH YOUR TEETH THE MORNING OF SURGERY WITH YOUR REGULAR TOOTHPASTE  DENTURES WILL BE REMOVED PRIOR TO SURGERY PLEASE DO NOT APPLY "Poly grip"  OR ADHESIVES!!!   Take these medicines the morning of surgery with A SIP OF WATER: Tylenol, Inhaler, Amlodipine, Metoprolol, Omeprazole, Prednisone  Bring CPAP mask and tubing day of surgery.                              You may not have any metal on your body including hair pins, jewelry, and body piercing             Do not wear make-up, lotions, powders, perfumes/cologne, or deodorant  Do not shave  48 hours prior to surgery.               Men may shave face and neck.   Do not bring valuables to the hospital. [place] IS NOT             RESPONSIBLE   FOR VALUABLES.   Contacts, glasses, dentures or bridgework may not be worn into surgery.   Bring small overnight bag day of surgery.   DO NOT BRING YOUR HOME MEDICATIONS TO THE HOSPITAL. PHARMACY WILL DISPENSE MEDICATIONS LISTED ON YOUR MEDICATION LIST TO YOU DURING YOUR ADMISSION IN THE HOSPITAL!     Special Instructions: Bring a copy of your healthcare power of attorney and living will documents the day of surgery if you haven't scanned them before.              Please read over the following fact sheets you were given: IF YOU HAVE QUESTIONS ABOUT YOUR PRE-OP INSTRUCTIONS PLEASE CALL [name]    If you received a COVID test during your pre-op visit  it is requested that you wear a mask when out in public, stay away from anyone that may not be feeling well and notify your surgeon if you develop symptoms. If you test positive for Covid or have been in contact with anyone that has tested positive in  the last 10 days please notify you surgeon.    [facility] - Preparing for Surgery Before surgery, you can play an important role.  Because skin is not sterile, your skin needs to be as free of germs as possible.  You can reduce the number of germs on your skin by washing with CHG (chlorahexidine gluconate) soap before surgery.  CHG is an antiseptic cleaner which kills germs and bonds with the skin to continue killing germs even after washing. Please DO NOT use if you have an allergy to CHG or antibacterial soaps.  If your skin becomes reddened/irritated stop using the CHG and inform your nurse when you arrive at Short Stay. Do not shave (including legs and underarms) for at least 48 hours prior to the first CHG shower.  You may shave your face/neck.  Please follow these instructions carefully:  1.  Shower with CHG Soap the night before surgery and the  morning of surgery.  2.  If you choose to wash your hair, wash your hair first as usual with your normal  shampoo.  3.  After you shampoo, rinse your hair and body thoroughly to remove the shampoo.                             4.  Use CHG as you would any other liquid soap.  You can apply chg directly to the skin and wash.  Gently with a scrungie or clean washcloth.  5.  Apply the CHG Soap  to your body ONLY FROM THE NECK DOWN.   Do   not use on face/ open                           Wound or open sores. Avoid contact with eyes, ears mouth and   genitals (private parts).                       Wash face,  Genitals (private parts) with your normal soap.             6.  Wash thoroughly, paying special attention to the area where your    surgery  will be performed.  7.  Thoroughly rinse your body with warm water from the neck down.  8.  DO NOT shower/wash with your normal soap after using and rinsing off the CHG Soap.                9.  Pat yourself dry with a clean towel.            10.  Wear clean pajamas.            11.  Place clean sheets on your bed  the night of your first shower and do not  sleep with pets. Day of Surgery : Do not apply any lotions/deodorants the morning of surgery.  Please wear clean clothes to the hospital/surgery center.  FAILURE TO FOLLOW THESE INSTRUCTIONS MAY RESULT IN THE CANCELLATION OF YOUR SURGERY  PATIENT SIGNATURE_________________________________  NURSE SIGNATURE__________________________________  ________________________________________________________________________  Maxwell Li  An incentive spirometer is a tool that can help keep your lungs clear and active. This tool measures how well you are filling your lungs with each breath. Taking long deep breaths may help reverse or decrease the chance of developing breathing (pulmonary) problems (especially infection) following: A long period of time when you are unable to move or be active. BEFORE THE PROCEDURE  If the spirometer includes an indicator to show your best effort, your nurse or respiratory therapist will set it to a desired goal. If possible, sit up straight or lean slightly forward. Try not to slouch. Hold the incentive spirometer in an upright position. INSTRUCTIONS FOR USE  Sit on the edge of your bed if possible, or sit up as far as you can in bed or on a chair. Hold the incentive spirometer in an upright position. Breathe out normally. Place the mouthpiece in your mouth and seal your lips tightly around it. Breathe in slowly and as deeply as possible, raising the piston or the ball toward the top of the column. Hold your breath for 3-5 seconds or for as long as possible. Allow the piston or ball to fall to the bottom of the column. Remove the mouthpiece from your mouth and breathe out normally. Rest for a few seconds and repeat Steps 1 through 7 at least 10 times every 1-2 hours when you are awake. Take your time and take a few normal breaths between deep breaths. The spirometer may include an indicator to show your best effort.  Use the indicator as a goal to work toward during each repetition. After each set of 10 deep breaths, practice coughing to be sure your lungs are clear. If you have an incision (the cut made at the time of surgery), support your incision when coughing by placing a pillow or rolled up towels firmly against it. Once you are able to  get out of bed, walk around indoors and cough well. You may stop using the incentive spirometer when instructed by your caregiver.  RISKS AND COMPLICATIONS Take your time so you do not get dizzy or light-headed. If you are in pain, you may need to take or ask for pain medication before doing incentive spirometry. It is harder to take a deep breath if you are having pain. AFTER USE Rest and breathe slowly and easily. It can be helpful to keep track of a log of your progress. Your caregiver can provide you with a simple table to help with this. If you are using the spirometer at home, follow these instructions: SEEK MEDICAL CARE IF:  You are having difficultly using the spirometer. You have trouble using the spirometer as often as instructed. Your pain medication is not giving enough relief while using the spirometer. You develop fever of 100.5 F (38.1 C) or higher. SEEK IMMEDIATE MEDICAL CARE IF:  You cough up bloody sputum that had not been present before. You develop fever of 102 F (38.9 C) or greater. You develop worsening pain at or near the incision site. MAKE SURE YOU:  Understand these instructions. Will watch your condition. Will get help right away if you are not doing well or get worse. Document Released: 05/07/2006 Document Revised: 03/19/2011 Document Reviewed: 07/08/2006 Exit[employer] Patient Information 2014 Exit[employer], [place].   ________________________________________________________________________ WHAT IS A BLOOD TRANSFUSION? Blood Transfusion Information  A transfusion is the replacement of blood or some of its parts. Blood is made up of multiple  cells which provide different functions. Red blood cells carry oxygen and are used for blood loss replacement. White blood cells fight against infection. Platelets control bleeding. Plasma helps clot blood. Other blood products are available for specialized needs, such as hemophilia or other clotting disorders. BEFORE THE TRANSFUSION  Who gives blood for transfusions?  Healthy volunteers who are fully evaluated to make sure their blood is safe. This is blood bank blood. Transfusion therapy is the safest it has ever been in the practice of medicine. Before blood is taken from a donor, a complete history is taken to make sure that person has no history of diseases nor engages in risky social behavior (examples are intravenous drug use or sexual activity with multiple partners). The donor's travel history is screened to minimize risk of transmitting infections, such as malaria. The donated blood is tested for signs of infectious diseases, such as HIV and hepatitis. The blood is then tested to be sure it is compatible with you in order to minimize the chance of a transfusion reaction. If you or a relative donates blood, this is often done in anticipation of surgery and is not appropriate for emergency situations. It takes many days to process the donated blood. RISKS AND COMPLICATIONS Although transfusion therapy is very safe and saves many lives, the main dangers of transfusion include:  Getting an infectious disease. Developing a transfusion reaction. This is an allergic reaction to something in the blood you were given. Every precaution is taken to prevent this. The decision to have a blood transfusion has been considered carefully by your caregiver before blood is given. Blood is not given unless the benefits outweigh the risks. AFTER THE TRANSFUSION Right after receiving a blood transfusion, you will usually feel much better and more energetic. This is especially true if your red blood cells have  gotten low (anemic). The transfusion raises the level of the red blood cells which carry oxygen, and this usually  causes an energy increase. The nurse administering the transfusion will monitor you carefully for complications. HOME CARE INSTRUCTIONS  No special instructions are needed after a transfusion. You may find your energy is better. Speak with your caregiver about any limitations on activity for underlying diseases you may have. SEEK MEDICAL CARE IF:  Your condition is not improving after your transfusion. You develop redness or irritation at the intravenous (IV) site. SEEK IMMEDIATE MEDICAL CARE IF:  Any of the following symptoms occur over the next 12 hours: Shaking chills. You have a temperature by mouth above 102 F (38.9 C), not controlled by medicine. Chest, back, or muscle pain. People around you feel you are not acting correctly or are confused. Shortness of breath or difficulty breathing. Dizziness and fainting. You get a rash or develop hives. You have a decrease in urine output. Your urine turns a dark color or changes to pink, red, or brown. Any of the following symptoms occur over the next 10 days: You have a temperature by mouth above 102 F (38.9 C), not controlled by medicine. Shortness of breath. Weakness after normal activity. The white part of the eye turns yellow (jaundice). You have a decrease in the amount of urine or are urinating less often. Your urine turns a dark color or changes to pink, red, or brown. Document Released: 12/23/1999 Document Revised: 03/19/2011 Document Reviewed: 08/11/2007 [facility] Patient Information 2014 [place], [place].  _______________________________________________________________________

## 2022-01-31 NOTE — Progress Notes (Unsigned)
PERIOPERATIVE PRESCRIPTION FOR IMPLANTED CARDIAC DEVICE PROGRAMMING  Patient Information: Name:  Maxwell Li  DOB:  Nov 24, 1955  MRN:  [id number]  {TIP - You do not have to delete this tip  -  Copy the info from the staff message sent by the PAT staff  then press F2 here and paste the information using CTL - V on the next line :[id number]}  lanned Procedure:  left total hip arthroplasty- anterior approach  Surgeon:  Dr. Zollie Beckers  Date of Procedure:  02/09/22  Cautery will be used.  Position during surgery:  unknown   Please send documentation back to:  [facility] (Fax # [phone])   Device Information:  Clinic EP Physician:  Allegra Lai, MD   Device Type:  Pacemaker Manufacturer and Phone #:  [employer]: [phone] Pacemaker Dependent?:  No. Date of Last Device Check:  12/20/21 Normal Device Function?:  Yes.    Electrophysiologist's Recommendations:  Have magnet available. Provide continuous ECG monitoring when magnet is used or reprogramming is to be performed.  Procedure should not interfere with device function.  No device programming or magnet placement needed.  Per Device Clinic Standing Orders, Wanda Plump, RN  11:15 AM 01/31/2022

## 2022-02-01 ENCOUNTER — Encounter (HOSPITAL_COMMUNITY)
Admission: RE | Admit: 2022-02-01 | Discharge: 2022-02-01 | Disposition: A | Discharge status: Home / Self Care | Payer: Medicare HMO | Source: Ambulatory Visit | Attending: Orthopaedic Surgery | Admitting: Orthopaedic Surgery

## 2022-02-01 ENCOUNTER — Encounter (HOSPITAL_COMMUNITY): Payer: Self-pay

## 2022-02-01 VITALS — BP 151/87 | HR 62 | Temp 97.8°F | Resp 14 | Ht 63.0 in | Wt 167.8 lb

## 2022-02-01 DIAGNOSIS — Z01812 Encounter for preprocedural laboratory examination: Secondary | ICD-10-CM | POA: Diagnosis not present

## 2022-02-01 DIAGNOSIS — K50819 Crohn's disease of both small and large intestine with unspecified complications: Secondary | ICD-10-CM | POA: Diagnosis not present

## 2022-02-01 DIAGNOSIS — Z01818 Encounter for other preprocedural examination: Secondary | ICD-10-CM

## 2022-02-01 HISTORY — DX: Presence of cardiac pacemaker: Z95.0

## 2022-02-01 HISTORY — DX: Unspecified osteoarthritis, unspecified site: M19.90

## 2022-02-01 HISTORY — DX: Personal history of urinary calculi: Z87.442

## 2022-02-01 HISTORY — DX: Sick sinus syndrome: I49.5

## 2022-02-01 HISTORY — DX: Unspecified atrioventricular block: I44.30

## 2022-02-01 HISTORY — DX: Pneumonia, unspecified organism: J18.9

## 2022-02-01 LAB — COMPREHENSIVE METABOLIC PANEL
ALT: 13 U/L (ref 0–44)
AST: 22 U/L (ref 15–41)
Albumin: 3.5 g/dL (ref 3.5–5.0)
Alkaline Phosphatase: 42 U/L (ref 38–126)
Anion gap: 11 (ref 5–15)
BUN: 11 mg/dL (ref 8–23)
CO2: 27 mmol/L (ref 22–32)
Calcium: 8.8 mg/dL — ABNORMAL LOW (ref 8.9–10.3)
Chloride: 95 mmol/L — ABNORMAL LOW (ref 98–111)
Creatinine, Ser: 0.72 mg/dL (ref 0.61–1.24)
GFR, Estimated: >60 mL/min (ref >60)
Glucose, Bld: 110 mg/dL — ABNORMAL HIGH (ref 70–99)
Potassium: 3.9 mmol/L (ref 3.5–5.1)
Sodium: 133 mmol/L — ABNORMAL LOW (ref 135–145)
Total Bilirubin: 1 mg/dL (ref 0.3–1.2)
Total Protein: 6.6 g/dL (ref 6.5–8.1)

## 2022-02-01 LAB — CBC
HCT: 40.8 % (ref 39.0–52.0)
Hemoglobin: 13.5 g/dL (ref 13.0–17.0)
MCH: 31.8 pg (ref 26.0–34.0)
MCHC: 33.1 g/dL (ref 30.0–36.0)
MCV: 96.2 fL (ref 80.0–100.0)
Platelets: 91 10*3/uL — ABNORMAL LOW (ref 150–400)
RBC: 4.24 MIL/uL (ref 4.22–5.81)
RDW: 13.6 % (ref 11.5–15.5)
WBC: 2.5 10*3/uL — ABNORMAL LOW (ref 4.0–10.5)
nRBC: 0 % (ref 0.0–0.2)

## 2022-02-01 LAB — SURGICAL PCR SCREEN
MRSA, PCR: NEGATIVE
Staphylococcus aureus: NEGATIVE

## 2022-02-01 NOTE — Progress Notes (Signed)
Platelets 91, results routed to Dr. Ninfa Linden

## 2022-02-01 NOTE — Telephone Encounter (Signed)
Received a fax from  [place] regarding an approval for [facility] patient assistance from 1.5.24 to 12.31.24. Approval letter sent to scan center.  Phone number: [phone]

## 2022-02-02 NOTE — Anesthesia Preprocedure Evaluation (Addendum)
Anesthesia Evaluation  Patient identified by MRN, date of birth, ID band Patient awake    Reviewed: Allergy & Precautions, NPO status , Patient's Chart, lab work & pertinent test results  History of Anesthesia Complications Negative for: history of anesthetic complications  Airway Mallampati: IV  TM Distance: >3 FB Neck ROM: Full    Dental  (+) Partial Lower, Dental Advisory Given   Pulmonary neg shortness of breath, asthma , sleep apnea and Continuous Positive Airway Pressure Ventilation , neg COPD, neg recent URI, former smoker    + wheezing      Cardiovascular hypertension (amlodipine, metoprolol), Pt. on medications and Pt. on home beta blockers (-) angina (-) Past MI, (-) Cardiac Stents and (-) CABG + dysrhythmias (complete AV block, RBBB) + pacemaker (placed for SSS)  Rhythm:Regular Rate:Normal  HLD  TTE 06/09/2015: Study Conclusions   - Left ventricle: Abnormal septal motion The cavity size was    normal. Wall thickness was normal. Systolic function was normal.    The estimated ejection fraction was in the range of 50% to 55%.    Wall motion was normal; there were no regional wall motion    abnormalities. Left ventricular diastolic function parameters    were normal.  - Atrial septum: A patent foramen ovale cannot be excluded.     Neuro/Psych negative neurological ROS     GI/Hepatic ,GERD  Medicated,,Hepatic steatosis Crohn's disease, diverticulosis   Endo/Other  negative endocrine ROS    Renal/GU negative Renal ROS     Musculoskeletal  (+) Arthritis ,    Abdominal  (+) + obese  Peds  Hematology  (+) Blood dyscrasia (chronic thrombocytopenia (plt 91))   Anesthesia Other Findings   Reproductive/Obstetrics                              Anesthesia Physical Anesthesia Plan  ASA: 3  Anesthesia Plan: General   Post-op Pain Management: Tylenol PO (pre-op)*   Induction:  Intravenous  PONV Risk Score and Plan: 2 and Ondansetron, Dexamethasone and Treatment may vary due to age or medical condition  Airway Management Planned: Oral ETT and Video Laryngoscope Planned  Additional Equipment:   Intra-op Plan:   Post-operative Plan: Extubation in OR  Informed Consent: I have reviewed the patients History and Physical, chart, labs and discussed the procedure including the risks, benefits and alternatives for the proposed anesthesia with the patient or authorized representative who has indicated his/her understanding and acceptance.     Dental advisory given  Plan Discussed with: CRNA and Anesthesiologist  Anesthesia Plan Comments: (Patient to receive albuterol nebulizer given wheezing on exam.   Platelets rechecked as they were 91 previously. Platelets 124 today. Discussed spinal anesthesia with patient. He prefers to have general anesthesia given his lidocaine allergy and his report that he has tolerated GA well in the past.  Risks of general anesthesia discussed including, but not limited to, sore throat, hoarse voice, chipped/damaged teeth, injury to vocal cords, nausea and vomiting, allergic reactions, lung infection, heart attack, stroke, and death. All questions answered.   See PAT note 02/01/2022)        Anesthesia Quick Evaluation

## 2022-02-02 NOTE — Progress Notes (Signed)
Anesthesia Chart Review    Case: [id number] Date/Time: 02/09/22 1125   Procedure: LEFT TOTAL HIP ARTHROPLASTY ANTERIOR APPROACH (Left: Hip)   Anesthesia type: Choice   Pre-op diagnosis: left hip osteoarthritis   Location: [facility] ROOM 10 / [facility]   Surgeons: Mcarthur Rossetti, MD       DISCUSSION:66 y.o. 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 Jean Rosenthal.   Pt last seen by cardiology 12/06/2021. Per OV note, "Hip surgery Low cardiac risk surgery Low cardiac risk score usual peri-operative PPM management, though typically with surgeries below umbilicus does not require any specific device management"  Anticipate pt can proceed with planned procedure barring acute status change.   VS: BP (!) 151/87   Pulse 62   Temp 36.6 C (Oral)   Resp 14   Ht '5\' 3"'$  (1.6 m)   Wt 76.1 kg   SpO2 98%   BMI 29.72 kg/m   PROVIDERS: Rubie Maid, MD is PCP   Cardiologist - Minus Breeding, MD  Electrophysiologist-  Will Camnitz LABS: Labs reviewed: Acceptable for surgery. (all labs ordered are listed, but only abnormal results are displayed)  Labs Reviewed  CBC - Abnormal; Notable for the following components:      Result Value   WBC 2.5 (*)    Platelets 91 (*)    All other components within normal limits  COMPREHENSIVE METABOLIC PANEL - Abnormal; Notable for the following components:   Sodium 133 (*)    Chloride 95 (*)    Glucose, Bld 110 (*)    Calcium 8.8 (*)    All other components within normal limits  SURGICAL PCR SCREEN  TYPE AND SCREEN     IMAGES:   EKG:   CV: Echo 06/09/2015 Study Conclusions   - Left ventricle: Abnormal septal motion The cavity size was    normal. Wall thickness was normal. Systolic function was normal.    The estimated ejection fraction was in the  range of 50% to 55%.    Wall motion was normal; there were no regional wall motion    abnormalities. Left ventricular diastolic function parameters    were normal.  - Atrial septum: A patent foramen ovale cannot be excluded.  Past Medical History:  Diagnosis Date   Adenomatous colon polyp    Arthritis    AV block    B12 deficiency    Crohn's disease ([place])    Diverticulosis    DYSLIPIDEMIA 06/28/2009   Qualifier: Diagnosis of  By: Loanne Drilling MD, Sean A    GERD 09/04/2006   Qualifier: Diagnosis of  By: Loanne Drilling MD, Sean A    Hepatic steatosis    History of kidney stones    Hypertension    HYPOGONADISM, MALE 05/20/2008   Qualifier: Diagnosis of  By: Loanne Drilling MD, Sean A    Internal hemorrhoids    KNEE PAIN 03/24/2007   Qualifier: Diagnosis of  By: Loanne Drilling MD, Sean A    Pneumonia    Presence of permanent cardiac pacemaker    SSS (sick sinus syndrome) ([place])    Ventral hernia     Past Surgical History:  Procedure Laterality Date   APPENDECTOMY     CATARACT EXTRACTION     in 1990s   ELECTROCARDIOGRAM  02/01/2006   EP IMPLANTABLE DEVICE N/A 06/09/2015   Procedure: Pacemaker Implant;  Surgeon: [employer],  MD;  Location: [facility];  Service: Cardiovascular;  Laterality: N/A;   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   Left   PARS PLANA VITRECTOMY  11/27/2021   Rest Cardiolite  11/21/2000    MEDICATIONS:  acetaminophen (TYLENOL) 650 MG CR tablet   albuterol (VENTOLIN HFA) 108 (90 Base) MCG/ACT inhaler   amLODipine (NORVASC) 5 MG tablet   Cholecalciferol (VITAMIN D-3) 125 MCG (5000 UT) TABS   cyanocobalamin (VITAMIN B12) 1000 MCG tablet   cyanocobalamin (VITAMIN B12) 1000 MCG/ML injection   folic acid (FOLVITE) 160 MCG tablet   metoprolol tartrate (LOPRESSOR) 25 MG tablet   omeprazole (PRILOSEC) 20 MG capsule   predniSONE (DELTASONE) 5 MG tablet   PRESCRIPTION MEDICATION   Risankizumab-rzaa (SKYRIZI) 360 MG/2.4ML SOCT   testosterone cypionate (DEPOTESTOTERONE  CYPIONATE) 200 MG/ML injection   No current facility-administered medications for this encounter.    Maxwell Felix Ward, PA-C [facility]-Surgical Testing [phone]

## 2022-02-08 NOTE — H&P (Signed)
TOTAL HIP ADMISSION H&P  Patient is admitted for left total hip arthroplasty.  Subjective:  Chief Complaint: left hip pain  HPI: Maxwell Li, 67 y.o. male, has a history of pain and functional disability in the left hip(s) due to arthritis and patient has failed non-surgical conservative treatments for greater than 12 weeks to include corticosteriod injections, use of assistive devices, weight reduction as appropriate, and activity modification.  Onset of symptoms was gradual starting 3 years ago with gradually worsening course since that time.The patient noted no past surgery on the left hip(s).  Patient currently rates pain in the left hip at 10 out of 10 with activity. Patient has night pain, worsening of pain with activity and weight bearing, trendelenberg gait, pain that interfers with activities of daily living, and pain with passive range of motion. Patient has evidence of subchondral cysts, subchondral sclerosis, periarticular osteophytes, and joint space narrowing by imaging studies. This condition presents safety issues increasing the risk of falls.  There is no current active infection.  Patient Active Problem List   Diagnosis Date Noted   Unilateral primary osteoarthritis, left hip 10/17/2021   Degenerative arthritis of right knee 10/17/2021   Crohn's disease of small and large intestines with complication ([place]) 13/08/6576   Crohn's colitis, with intestinal obstruction ([place]) 06/10/2021   DNR (do not resuscitate) 06/10/2021   Hypogonadism in male 03/15/2017   Postural dizziness with presyncope 03/15/2017   Crohn disease ([place]) 03/15/2017   OSA on CPAP 03/15/2017   Essential hypertension 03/15/2017   Leukopenia 03/15/2017   Hypocalcemia 03/15/2017   Hypokalemia 03/15/2017   AV block, complete ([place])    Sick sinus syndrome ([place]) 06/08/2015   Other abnormal blood chemistry 06/24/2012   Encounter for long-term (current) use of other medications 06/24/2012   Screening for  prostate cancer 06/24/2012   DYSLIPIDEMIA 06/28/2009   HYPOGONADISM, MALE 05/20/2008   KNEE PAIN 03/24/2007   ABDOMINAL PAIN, UNSPECIFIED SITE 03/24/2007   GERD 09/04/2006   Past Medical History:  Diagnosis Date   Adenomatous colon polyp    Arthritis    AV block    B12 deficiency    Crohn's disease ([place])    Diverticulosis    DYSLIPIDEMIA 06/28/2009   Qualifier: Diagnosis of  By: Loanne Drilling MD, Sean A    GERD 09/04/2006   Qualifier: Diagnosis of  By: Loanne Drilling MD, Sean A    Hepatic steatosis    History of kidney stones    Hypertension    HYPOGONADISM, MALE 05/20/2008   Qualifier: Diagnosis of  By: Loanne Drilling MD, Sean A    Internal hemorrhoids    KNEE PAIN 03/24/2007   Qualifier: Diagnosis of  By: Loanne Drilling MD, Sean A    Pneumonia    Presence of permanent cardiac pacemaker    SSS (sick sinus syndrome) ([place])    Ventral hernia     Past Surgical History:  Procedure Laterality Date   APPENDECTOMY     CATARACT EXTRACTION     in 1990s   ELECTROCARDIOGRAM  02/01/2006   EP IMPLANTABLE DEVICE N/A 06/09/2015   Procedure: Pacemaker Implant;  Surgeon: Will Meredith Leeds, MD;  Location: [facility];  Service: Cardiovascular;  Laterality: N/A;   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   Left   PARS PLANA VITRECTOMY  11/27/2021   Rest Cardiolite  11/21/2000    No current facility-administered medications for this encounter.   Current Outpatient Medications  Medication Sig Dispense Refill Last Dose   acetaminophen (TYLENOL) 650 MG CR  tablet Take 1,300 mg by mouth daily.      albuterol (VENTOLIN HFA) 108 (90 Base) MCG/ACT inhaler Inhale 2 puffs into the lungs every 6 (six) hours as needed for wheezing or shortness of breath.      amLODipine (NORVASC) 5 MG tablet Take 5 mg by mouth daily.      Cholecalciferol (VITAMIN D-3) 125 MCG (5000 UT) TABS Take 1 tablet by mouth daily at 2 PM.      cyanocobalamin (VITAMIN B12) 1000 MCG tablet Take 1,000 mcg by mouth daily.      cyanocobalamin (VITAMIN  B12) 1000 MCG/ML injection Inject 1 mL (1,000 mcg total) into the skin every 30 (thirty) days. At PCP office 1 mL 0    folic acid (FOLVITE) 503 MCG tablet Take 800 mcg by mouth daily.      metoprolol tartrate (LOPRESSOR) 25 MG tablet Take 1 tablet (25 mg total) by mouth 2 (two) times daily. 180 tablet 3    omeprazole (PRILOSEC) 20 MG capsule Take 1 capsule (20 mg total) by mouth daily. 90 capsule 1    predniSONE (DELTASONE) 5 MG tablet Take 1 tablet (5 mg total) by mouth daily. 30 tablet 2    Risankizumab-rzaa (SKYRIZI) 360 MG/2.4ML SOCT Inject 360 mg into the skin See admin instructions. Inject '360mg'$  every 8 weeks.      PRESCRIPTION MEDICATION Inhale into the lungs at bedtime. CPAP      testosterone cypionate (DEPOTESTOTERONE CYPIONATE) 200 MG/ML injection Inject 1 mL (200 mg total) into the muscle every 14 (fourteen) days. (Patient not taking: Reported on 01/29/2022) 10 mL 0 Not Taking   Allergies  Allergen Reactions   Lidocaine Swelling    Throat swelling    Lovastatin Nausea Only   Shellfish Allergy Hives and Itching    Social History   Tobacco Use   Smoking status: Former    Types: Cigarettes   Smokeless tobacco: Never  Substance Use Topics   Alcohol use: Yes    Comment: occ    Family History  Problem Relation Age of Onset   Stroke Father    Cancer Brother        Lung Cancer   Heart attack Brother    Colon cancer Neg Hx    Esophageal cancer Neg Hx    Pancreatic cancer Neg Hx    Stomach cancer Neg Hx      Review of Systems  Objective:  Physical Exam Vitals reviewed.  Constitutional:      Appearance: Normal appearance.  HENT:     Head: Normocephalic and atraumatic.  Eyes:     Extraocular Movements: Extraocular movements intact.     Pupils: Pupils are equal, round, and reactive to light.  Cardiovascular:     Rate and Rhythm: Normal rate.  Pulmonary:     Effort: Pulmonary effort is normal.     Breath sounds: Normal breath sounds.  Abdominal:     Palpations:  Abdomen is soft.  Musculoskeletal:     Cervical back: Normal range of motion and neck supple.     Left hip: Tenderness and bony tenderness present. Decreased range of motion. Decreased strength.  Neurological:     Mental Status: He is alert and oriented to person, place, and time.  Psychiatric:        Behavior: Behavior normal.     Vital signs in last 24 hours:    Labs:   Estimated body mass index is 29.72 kg/m as calculated from the following:   Height as  of 02/01/22: '5\' 3"'$  (1.6 m).   Weight as of 02/01/22: 76.1 kg.   Imaging Review Plain radiographs demonstrate severe degenerative joint disease of the left hip(s). The bone quality appears to be good for age and reported activity level.      Assessment/Plan:  End stage arthritis, left hip(s)  The patient history, physical examination, clinical judgement of the provider and imaging studies are consistent with end stage degenerative joint disease of the left hip(s) and total hip arthroplasty is deemed medically necessary. The treatment options including medical management, injection therapy, arthroscopy and arthroplasty were discussed at length. The risks and benefits of total hip arthroplasty were presented and reviewed. The risks due to aseptic loosening, infection, stiffness, dislocation/subluxation,  thromboembolic complications and other imponderables were discussed.  The patient acknowledged the explanation, agreed to proceed with the plan and consent was signed. Patient is being admitted for inpatient treatment for surgery, pain control, PT, OT, prophylactic antibiotics, VTE prophylaxis, progressive ambulation and ADL's and discharge planning.The patient is planning to be discharged home with home health services

## 2022-02-09 ENCOUNTER — Observation Stay (HOSPITAL_COMMUNITY)
Admission: RE | Admit: 2022-02-09 | Discharge: 2022-02-11 | Disposition: A | Discharge status: Home / Self Care | Payer: Medicare HMO | Attending: Orthopaedic Surgery | Admitting: Orthopaedic Surgery

## 2022-02-09 ENCOUNTER — Other Ambulatory Visit: Payer: Self-pay

## 2022-02-09 ENCOUNTER — Ambulatory Visit (HOSPITAL_COMMUNITY): Payer: Medicare HMO

## 2022-02-09 ENCOUNTER — Ambulatory Visit (HOSPITAL_COMMUNITY): Payer: Medicare HMO | Admitting: Physician Assistant

## 2022-02-09 ENCOUNTER — Observation Stay (HOSPITAL_COMMUNITY): Payer: Medicare HMO

## 2022-02-09 ENCOUNTER — Encounter (HOSPITAL_COMMUNITY)
Admission: RE | Disposition: A | Discharge status: Home / Self Care | Payer: Self-pay | Source: Home / Self Care | Attending: Orthopaedic Surgery

## 2022-02-09 ENCOUNTER — Encounter (HOSPITAL_COMMUNITY): Payer: Self-pay | Admitting: Orthopaedic Surgery

## 2022-02-09 ENCOUNTER — Ambulatory Visit (HOSPITAL_BASED_OUTPATIENT_CLINIC_OR_DEPARTMENT_OTHER): Payer: Medicare HMO | Admitting: Certified Registered"

## 2022-02-09 DIAGNOSIS — Z87891 Personal history of nicotine dependence: Secondary | ICD-10-CM | POA: Insufficient documentation

## 2022-02-09 DIAGNOSIS — Z79899 Other long term (current) drug therapy: Secondary | ICD-10-CM | POA: Diagnosis not present

## 2022-02-09 DIAGNOSIS — Z96642 Presence of left artificial hip joint: Secondary | ICD-10-CM

## 2022-02-09 DIAGNOSIS — I10 Essential (primary) hypertension: Secondary | ICD-10-CM | POA: Diagnosis not present

## 2022-02-09 DIAGNOSIS — Z950 Presence of cardiac pacemaker: Secondary | ICD-10-CM | POA: Diagnosis not present

## 2022-02-09 DIAGNOSIS — Z7982 Long term (current) use of aspirin: Secondary | ICD-10-CM | POA: Insufficient documentation

## 2022-02-09 DIAGNOSIS — M1612 Unilateral primary osteoarthritis, left hip: Secondary | ICD-10-CM

## 2022-02-09 DIAGNOSIS — I442 Atrioventricular block, complete: Secondary | ICD-10-CM | POA: Diagnosis not present

## 2022-02-09 DIAGNOSIS — I959 Hypotension, unspecified: Secondary | ICD-10-CM | POA: Diagnosis not present

## 2022-02-09 DIAGNOSIS — D696 Thrombocytopenia, unspecified: Secondary | ICD-10-CM

## 2022-02-09 HISTORY — PX: TOTAL HIP ARTHROPLASTY: SHX124

## 2022-02-09 LAB — CBC
HCT: 38.9 % — ABNORMAL LOW (ref 39.0–52.0)
Hemoglobin: 12.9 g/dL — ABNORMAL LOW (ref 13.0–17.0)
MCH: 32 pg (ref 26.0–34.0)
MCHC: 33.2 g/dL (ref 30.0–36.0)
MCV: 96.5 fL (ref 80.0–100.0)
Platelets: 124 10*3/uL — ABNORMAL LOW (ref 150–400)
RBC: 4.03 MIL/uL — ABNORMAL LOW (ref 4.22–5.81)
RDW: 13.4 % (ref 11.5–15.5)
WBC: 1.8 10*3/uL — ABNORMAL LOW (ref 4.0–10.5)
nRBC: 0 % (ref 0.0–0.2)

## 2022-02-09 LAB — TYPE AND SCREEN
ABO/RH(D): O NEG
Antibody Screen: NEGATIVE

## 2022-02-09 LAB — ABO/RH: ABO/RH(D): O NEG

## 2022-02-09 SURGERY — ARTHROPLASTY, HIP, TOTAL, ANTERIOR APPROACH
Anesthesia: General | Site: Hip | Laterality: Left

## 2022-02-09 MED ORDER — PREDNISONE 5 MG PO TABS
5.0000 mg | ORAL_TABLET | Freq: Every day | ORAL | Status: DC
  Administered 2022-02-10 – 2022-02-11 (×2): 5 mg via ORAL
  Filled 2022-02-09 (×2): qty 1

## 2022-02-09 MED ORDER — DIPHENHYDRAMINE HCL 12.5 MG/5ML PO ELIX: 12.5000 mg | ORAL_SOLUTION | ORAL | Status: DC | PRN

## 2022-02-09 MED ORDER — SODIUM CHLORIDE 0.9 % IV SOLN: INTRAVENOUS | Status: DC

## 2022-02-09 MED ORDER — CHLORHEXIDINE GLUCONATE 0.12 % MT SOLN
15.0000 mL | Freq: Once | OROMUCOSAL | Status: AC
  Administered 2022-02-09: 15 mL via OROMUCOSAL

## 2022-02-09 MED ORDER — ORAL CARE MOUTH RINSE: 15.0000 mL | Freq: Once | OROMUCOSAL | Status: AC

## 2022-02-09 MED ORDER — METHOCARBAMOL 500 MG PO TABS
500.0000 mg | ORAL_TABLET | Freq: Four times a day (QID) | ORAL | Status: DC | PRN
  Administered 2022-02-10 – 2022-02-11 (×3): 500 mg via ORAL
  Filled 2022-02-09 (×3): qty 1

## 2022-02-09 MED ORDER — OXYCODONE HCL 5 MG PO TABS
10.0000 mg | ORAL_TABLET | ORAL | Status: DC | PRN
  Administered 2022-02-10 – 2022-02-11 (×4): 10 mg via ORAL
  Filled 2022-02-09 (×4): qty 2

## 2022-02-09 MED ORDER — FENTANYL CITRATE (PF) 100 MCG/2ML IJ SOLN
INTRAMUSCULAR | Status: DC | PRN
  Administered 2022-02-09: 50 ug via INTRAVENOUS
  Administered 2022-02-09: 100 ug via INTRAVENOUS
  Administered 2022-02-09: 50 ug via INTRAVENOUS

## 2022-02-09 MED ORDER — ONDANSETRON HCL 4 MG PO TABS: 4.0000 mg | ORAL_TABLET | Freq: Four times a day (QID) | ORAL | Status: DC | PRN

## 2022-02-09 MED ORDER — OXYCODONE HCL 5 MG PO TABS
5.0000 mg | ORAL_TABLET | ORAL | Status: DC | PRN
  Administered 2022-02-09 (×3): 10 mg via ORAL
  Administered 2022-02-10: 5 mg via ORAL
  Administered 2022-02-11: 10 mg via ORAL
  Filled 2022-02-09 (×4): qty 2
  Filled 2022-02-09: qty 1

## 2022-02-09 MED ORDER — ONDANSETRON HCL 4 MG/2ML IJ SOLN: 4.0000 mg | Freq: Four times a day (QID) | INTRAMUSCULAR | Status: DC | PRN

## 2022-02-09 MED ORDER — AMLODIPINE BESYLATE 5 MG PO TABS
5.0000 mg | ORAL_TABLET | Freq: Every day | ORAL | Status: DC
  Administered 2022-02-10 – 2022-02-11 (×2): 5 mg via ORAL
  Filled 2022-02-09 (×2): qty 1

## 2022-02-09 MED ORDER — CEFAZOLIN SODIUM-DEXTROSE 2-4 GM/100ML-% IV SOLN
2.0000 g | INTRAVENOUS | Status: AC
  Administered 2022-02-09: 2 g via INTRAVENOUS
  Filled 2022-02-09: qty 100

## 2022-02-09 MED ORDER — METHOCARBAMOL 500 MG IVPB - SIMPLE MED
INTRAVENOUS | Status: AC
  Filled 2022-02-09: qty 55

## 2022-02-09 MED ORDER — ACETAMINOPHEN 325 MG PO TABS
325.0000 mg | ORAL_TABLET | Freq: Four times a day (QID) | ORAL | Status: DC | PRN
  Administered 2022-02-11: 650 mg via ORAL
  Filled 2022-02-09: qty 2

## 2022-02-09 MED ORDER — LACTATED RINGERS IV SOLN: INTRAVENOUS | Status: DC

## 2022-02-09 MED ORDER — ACETAMINOPHEN 500 MG PO TABS
1000.0000 mg | ORAL_TABLET | Freq: Once | ORAL | Status: AC
  Administered 2022-02-09: 1000 mg via ORAL
  Filled 2022-02-09: qty 2

## 2022-02-09 MED ORDER — ALBUTEROL SULFATE (2.5 MG/3ML) 0.083% IN NEBU: 2.5000 mg | INHALATION_SOLUTION | Freq: Once | RESPIRATORY_TRACT | Status: AC

## 2022-02-09 MED ORDER — METOCLOPRAMIDE HCL 5 MG/ML IJ SOLN: 5.0000 mg | Freq: Three times a day (TID) | INTRAMUSCULAR | Status: DC | PRN

## 2022-02-09 MED ORDER — METHOCARBAMOL 500 MG IVPB - SIMPLE MED: 500.0000 mg | Freq: Four times a day (QID) | INTRAVENOUS | Status: DC | PRN

## 2022-02-09 MED ORDER — PHENYLEPHRINE 80 MCG/ML (10ML) SYRINGE FOR IV PUSH (FOR BLOOD PRESSURE SUPPORT)
PREFILLED_SYRINGE | INTRAVENOUS | Status: DC | PRN
  Administered 2022-02-09 (×2): 80 ug via INTRAVENOUS

## 2022-02-09 MED ORDER — ALBUTEROL SULFATE (2.5 MG/3ML) 0.083% IN NEBU: 2.5000 mg | INHALATION_SOLUTION | Freq: Once | RESPIRATORY_TRACT | Status: DC

## 2022-02-09 MED ORDER — PROPOFOL 10 MG/ML IV BOLUS
INTRAVENOUS | Status: DC | PRN
  Administered 2022-02-09: 150 mg via INTRAVENOUS

## 2022-02-09 MED ORDER — ALBUTEROL SULFATE (2.5 MG/3ML) 0.083% IN NEBU: 2.5000 mg | INHALATION_SOLUTION | Freq: Four times a day (QID) | RESPIRATORY_TRACT | Status: DC | PRN

## 2022-02-09 MED ORDER — METOPROLOL TARTRATE 25 MG PO TABS
25.0000 mg | ORAL_TABLET | Freq: Two times a day (BID) | ORAL | Status: DC
  Administered 2022-02-09 – 2022-02-11 (×4): 25 mg via ORAL
  Filled 2022-02-09 (×4): qty 1

## 2022-02-09 MED ORDER — METOCLOPRAMIDE HCL 5 MG PO TABS: 5.0000 mg | ORAL_TABLET | Freq: Three times a day (TID) | ORAL | Status: DC | PRN

## 2022-02-09 MED ORDER — FOLIC ACID 1 MG PO TABS
1.0000 mg | ORAL_TABLET | Freq: Every day | ORAL | Status: DC
  Administered 2022-02-09 – 2022-02-11 (×3): 1 mg via ORAL
  Filled 2022-02-09 (×3): qty 1

## 2022-02-09 MED ORDER — CHOLECALCIFEROL 10 MCG (400 UNIT) PO TABS
400.0000 [IU] | ORAL_TABLET | Freq: Every day | ORAL | Status: DC
  Administered 2022-02-09 – 2022-02-10 (×2): 400 [IU] via ORAL
  Filled 2022-02-09 (×2): qty 1

## 2022-02-09 MED ORDER — ASPIRIN 81 MG PO CHEW
81.0000 mg | CHEWABLE_TABLET | Freq: Two times a day (BID) | ORAL | Status: DC
  Administered 2022-02-09 – 2022-02-11 (×4): 81 mg via ORAL
  Filled 2022-02-09 (×4): qty 1

## 2022-02-09 MED ORDER — PANTOPRAZOLE SODIUM 40 MG PO TBEC
40.0000 mg | DELAYED_RELEASE_TABLET | Freq: Every day | ORAL | Status: DC
  Administered 2022-02-10 – 2022-02-11 (×2): 40 mg via ORAL
  Filled 2022-02-09 (×3): qty 1

## 2022-02-09 MED ORDER — ONDANSETRON HCL 4 MG/2ML IJ SOLN
INTRAMUSCULAR | Status: AC
  Filled 2022-02-09: qty 2

## 2022-02-09 MED ORDER — MIDAZOLAM HCL 2 MG/2ML IJ SOLN
INTRAMUSCULAR | Status: AC
  Filled 2022-02-09: qty 2

## 2022-02-09 MED ORDER — PHENYLEPHRINE HCL-NACL 20-0.9 MG/250ML-% IV SOLN
INTRAVENOUS | Status: DC | PRN
  Administered 2022-02-09: 40 ug/min via INTRAVENOUS

## 2022-02-09 MED ORDER — PROPOFOL 10 MG/ML IV BOLUS
INTRAVENOUS | Status: AC
  Filled 2022-02-09: qty 20

## 2022-02-09 MED ORDER — POVIDONE-IODINE 10 % EX SWAB: 2.0000 | Freq: Once | CUTANEOUS | Status: DC

## 2022-02-09 MED ORDER — OXYCODONE HCL 5 MG/5ML PO SOLN: 5.0000 mg | Freq: Once | ORAL | Status: DC | PRN

## 2022-02-09 MED ORDER — DOCUSATE SODIUM 100 MG PO CAPS
100.0000 mg | ORAL_CAPSULE | Freq: Two times a day (BID) | ORAL | Status: DC
  Administered 2022-02-09 – 2022-02-11 (×4): 100 mg via ORAL
  Filled 2022-02-09 (×4): qty 1

## 2022-02-09 MED ORDER — OXYCODONE HCL 5 MG PO TABS: 5.0000 mg | ORAL_TABLET | Freq: Once | ORAL | Status: DC | PRN

## 2022-02-09 MED ORDER — HYDROMORPHONE HCL 1 MG/ML IJ SOLN
0.5000 mg | INTRAMUSCULAR | Status: DC | PRN
  Administered 2022-02-09: 0.5 mg via INTRAVENOUS
  Filled 2022-02-09: qty 1

## 2022-02-09 MED ORDER — HYDROMORPHONE HCL 1 MG/ML IJ SOLN
INTRAMUSCULAR | Status: AC
  Filled 2022-02-09: qty 2

## 2022-02-09 MED ORDER — DEXAMETHASONE SODIUM PHOSPHATE 10 MG/ML IJ SOLN
INTRAMUSCULAR | Status: DC | PRN
  Administered 2022-02-09: 8 mg via INTRAVENOUS

## 2022-02-09 MED ORDER — FENTANYL CITRATE (PF) 100 MCG/2ML IJ SOLN
INTRAMUSCULAR | Status: AC
  Filled 2022-02-09: qty 2

## 2022-02-09 MED ORDER — CEFAZOLIN SODIUM-DEXTROSE 1-4 GM/50ML-% IV SOLN
1.0000 g | Freq: Four times a day (QID) | INTRAVENOUS | Status: AC
  Administered 2022-02-09 – 2022-02-10 (×2): 1 g via INTRAVENOUS
  Filled 2022-02-09 (×2): qty 50

## 2022-02-09 MED ORDER — PHENOL 1.4 % MT LIQD: 1.0000 | OROMUCOSAL | Status: DC | PRN

## 2022-02-09 MED ORDER — POLYETHYLENE GLYCOL 3350 17 G PO PACK: 17.0000 g | PACK | Freq: Every day | ORAL | Status: DC | PRN

## 2022-02-09 MED ORDER — TRANEXAMIC ACID-NACL 1000-0.7 MG/100ML-% IV SOLN
1000.0000 mg | INTRAVENOUS | Status: AC
  Administered 2022-02-09: 1000 mg via INTRAVENOUS
  Filled 2022-02-09: qty 100

## 2022-02-09 MED ORDER — ALBUTEROL SULFATE (2.5 MG/3ML) 0.083% IN NEBU
INHALATION_SOLUTION | RESPIRATORY_TRACT | Status: AC
  Administered 2022-02-09: 2.5 mg via RESPIRATORY_TRACT
  Filled 2022-02-09: qty 3

## 2022-02-09 MED ORDER — SODIUM CHLORIDE 0.9 % IR SOLN
Status: DC | PRN
  Administered 2022-02-09: 1000 mL

## 2022-02-09 MED ORDER — AMISULPRIDE (ANTIEMETIC) 5 MG/2ML IV SOLN: 10.0000 mg | Freq: Once | INTRAVENOUS | Status: DC | PRN

## 2022-02-09 MED ORDER — DEXAMETHASONE SODIUM PHOSPHATE 10 MG/ML IJ SOLN
INTRAMUSCULAR | Status: AC
  Filled 2022-02-09: qty 1

## 2022-02-09 MED ORDER — ALUM & MAG HYDROXIDE-SIMETH 200-200-20 MG/5ML PO SUSP: 30.0000 mL | ORAL | Status: DC | PRN

## 2022-02-09 MED ORDER — HYDROMORPHONE HCL 1 MG/ML IJ SOLN
0.2500 mg | INTRAMUSCULAR | Status: DC | PRN
  Administered 2022-02-09 (×4): 0.5 mg via INTRAVENOUS

## 2022-02-09 MED ORDER — ROCURONIUM BROMIDE 10 MG/ML (PF) SYRINGE
PREFILLED_SYRINGE | INTRAVENOUS | Status: AC
  Filled 2022-02-09: qty 10

## 2022-02-09 MED ORDER — SUGAMMADEX SODIUM 200 MG/2ML IV SOLN
INTRAVENOUS | Status: DC | PRN
  Administered 2022-02-09: 200 mg via INTRAVENOUS

## 2022-02-09 MED ORDER — VITAMIN B-12 1000 MCG PO TABS
1000.0000 ug | ORAL_TABLET | Freq: Every day | ORAL | Status: DC
  Administered 2022-02-09 – 2022-02-11 (×3): 1000 ug via ORAL
  Filled 2022-02-09 (×3): qty 1

## 2022-02-09 MED ORDER — 0.9 % SODIUM CHLORIDE (POUR BTL) OPTIME
TOPICAL | Status: DC | PRN
  Administered 2022-02-09: 1000 mL

## 2022-02-09 MED ORDER — MIDAZOLAM HCL 2 MG/2ML IJ SOLN
INTRAMUSCULAR | Status: DC | PRN
  Administered 2022-02-09 (×2): 1 mg via INTRAVENOUS

## 2022-02-09 MED ORDER — MENTHOL 3 MG MT LOZG: 1.0000 | LOZENGE | OROMUCOSAL | Status: DC | PRN

## 2022-02-09 MED ORDER — ROCURONIUM BROMIDE 10 MG/ML (PF) SYRINGE
PREFILLED_SYRINGE | INTRAVENOUS | Status: DC | PRN
  Administered 2022-02-09: 20 mg via INTRAVENOUS
  Administered 2022-02-09: 50 mg via INTRAVENOUS

## 2022-02-09 SURGICAL SUPPLY — 45 items
APL SKNCLS STERI-STRIP NONHPOA (GAUZE/BANDAGES/DRESSINGS)
ARTICULEZE HEAD (Hips) ×1 IMPLANT
BAG COUNTER SPONGE SURGICOUNT (BAG) ×1 IMPLANT
BAG SPEC THK2 15X12 ZIP CLS (MISCELLANEOUS)
BAG SPNG CNTER NS LX DISP (BAG) ×1
BAG ZIPLOCK 12X15 (MISCELLANEOUS) IMPLANT
BENZOIN TINCTURE PRP APPL 2/3 (GAUZE/BANDAGES/DRESSINGS) IMPLANT
BLADE SAW SGTL 18X1.27X75 (BLADE) ×1 IMPLANT
COVER PERINEAL POST (MISCELLANEOUS) ×1 IMPLANT
COVER SURGICAL LIGHT HANDLE (MISCELLANEOUS) ×1 IMPLANT
DRAPE FOOT SWITCH (DRAPES) ×1 IMPLANT
DRAPE STERI IOBAN 125X83 (DRAPES) ×1 IMPLANT
DRAPE U-SHAPE 47X51 STRL (DRAPES) ×2 IMPLANT
DRESSING AQUACEL AG SP 3.5X10 (GAUZE/BANDAGES/DRESSINGS) IMPLANT
DRSG AQUACEL AG ADV 3.5X10 (GAUZE/BANDAGES/DRESSINGS) ×1 IMPLANT
DRSG AQUACEL AG SP 3.5X10 (GAUZE/BANDAGES/DRESSINGS) ×1
DURAPREP 26ML APPLICATOR (WOUND CARE) ×1 IMPLANT
ELECT REM PT RETURN 15FT ADLT (MISCELLANEOUS) ×1 IMPLANT
FEM STEM 12/14 TAPER SZ 4 HIP (Orthopedic Implant) ×1 IMPLANT
FEMORAL STEM 12/14 TPR SZ4 HIP (Orthopedic Implant) IMPLANT
GAUZE XEROFORM 1X8 LF (GAUZE/BANDAGES/DRESSINGS) ×1 IMPLANT
GLOVE BIO SURGEON STRL SZ7.5 (GLOVE) ×1 IMPLANT
GLOVE BIOGEL PI IND STRL 8 (GLOVE) ×2 IMPLANT
GLOVE ECLIPSE 8.0 STRL XLNG CF (GLOVE) ×1 IMPLANT
GOWN STRL REUS W/ TWL XL LVL3 (GOWN DISPOSABLE) ×2 IMPLANT
GOWN STRL REUS W/TWL XL LVL3 (GOWN DISPOSABLE) ×2
HANDPIECE INTERPULSE COAX TIP (DISPOSABLE) ×1
HEAD ARTICULEZE (Hips) IMPLANT
HOLDER FOLEY CATH W/STRAP (MISCELLANEOUS) ×1 IMPLANT
KIT TURNOVER KIT A (KITS) IMPLANT
LINER NEUTRAL 52X36MM PLUS 4 (Liner) IMPLANT
PACK ANTERIOR HIP CUSTOM (KITS) ×1 IMPLANT
PIN SECTOR W/GRIP ACE CUP 52MM (Hips) IMPLANT
SET HNDPC FAN SPRY TIP SCT (DISPOSABLE) ×1 IMPLANT
STAPLER VISISTAT 35W (STAPLE) IMPLANT
STRIP CLOSURE SKIN 1/2X4 (GAUZE/BANDAGES/DRESSINGS) IMPLANT
SUT ETHIBOND NAB CT1 #1 30IN (SUTURE) ×1 IMPLANT
SUT ETHILON 2 0 PS N (SUTURE) IMPLANT
SUT MNCRL AB 4-0 PS2 18 (SUTURE) IMPLANT
SUT VIC AB 0 CT1 36 (SUTURE) ×1 IMPLANT
SUT VIC AB 1 CT1 36 (SUTURE) ×1 IMPLANT
SUT VIC AB 2-0 CT1 27 (SUTURE) ×2
SUT VIC AB 2-0 CT1 TAPERPNT 27 (SUTURE) ×2 IMPLANT
TRAY FOLEY MTR SLVR 16FR STAT (SET/KITS/TRAYS/PACK) IMPLANT
YANKAUER SUCT BULB TIP NO VENT (SUCTIONS) ×1 IMPLANT

## 2022-02-09 NOTE — Evaluation (Addendum)
Physical Therapy Evaluation Patient Details Name: Maxwell Li MRN: [id number] DOB: 1955-08-11 Today's Date: 02/09/2022  History of Present Illness  Pt is a 67yo male presenting s/p L-THA, AA on 02/09/22. PMH: Crohn's disease, GERD, HTN, pacemaker.  Clinical Impression  Maxwell Li is a 67 y.o. male POD 0 s/p L-THA, AA. Patient reports independence with mobility at baseline. Per pt report he had a fall last Saturday 1/27 when "I fell out of bed and couldn't get up so I called out for my son who lives with me and he couldn't hear me so I called EMS and they came and got me back into the bed, my son was surprised to see the EMS guys!"  Patient is now limited by functional impairments (see PT problem list below) and requires min guard for bed mobility and for transfers. Patient was able to ambulate 40 feet with RW and min guard level of assist. Pt reporting high degree of pain throughout session though mobilizes well through it. Patient instructed in exercise to facilitate ROM and circulation to manage edema. Provided incentive spirometer and with Vcs pt able to achieve 1055m. Patient will benefit from continued skilled PT interventions to address impairments and progress towards PLOF. Acute PT will follow to progress mobility and stair training in preparation for safe discharge.   Of NOTE: Pt's family especially daughter SZenovia Li concern for safety with current living situation and are seeking to provide alternative solutions for pt upon discharge, citing the above report from the patient regarding his recent fall among other safety considerations. Would appreciate MD and TOC input to help find the most appropriate solution for this pt and his family, thank you.      Recommendations for follow up therapy are one component of a multi-disciplinary discharge planning process, led by the attending physician.  Recommendations may be updated based on patient status, additional  functional criteria and insurance authorization.  Follow Up Recommendations Follow physician's recommendations for discharge plan and follow up therapies      Assistance Recommended at Discharge Frequent or constant Supervision/Assistance  Patient can return home with the following  A little help with walking and/or transfers;A little help with bathing/dressing/bathroom;Assistance with cooking/housework;Assist for transportation;Help with stairs or ramp for entrance    Equipment Recommendations Rolling walker (2 wheels)  Recommendations for Other Services  Other (comment) (TOC re-consult please)    Functional Status Assessment Patient has had a recent decline in their functional status and demonstrates the ability to make significant improvements in function in a reasonable and predictable amount of time.     Precautions / Restrictions Precautions Precautions: Fall Precaution Comments: Pt had fall Saturday night out of bed Restrictions Weight Bearing Restrictions: No LLE Weight Bearing: Weight bearing as tolerated      Mobility  Bed Mobility Overal bed mobility: Needs Assistance Bed Mobility: Supine to Sit     Supine to sit: Min guard, HOB elevated     General bed mobility comments: Min guard, pt utilized gait belt to assist LLE, pt crying out in pain with mobility and sat EOB to recover breath.    Transfers Overall transfer level: Needs assistance Equipment used: Rolling walker (2 wheels) Transfers: Sit to/from Stand Sit to Stand: Min guard, From elevated surface           General transfer comment: For safety only, no physical assist required, VCs for sequencing    Ambulation/Gait Ambulation/Gait assistance: Min guard, +2 safety/equipment Gait Distance (Feet): 40 Feet Assistive  device: Rolling walker (2 wheels) Gait Pattern/deviations: Step-to pattern, Decreased stance time - left, Decreased step length - left, Decreased weight shift to left Gait velocity:  decreased     General Gait Details: Pt ambulated with RW and min guard, no physical assist required or overt LOB noted, +2 for recliner follow for safety, pt reporting pain throughout.  Stairs            Wheelchair Mobility    Modified Rankin (Stroke Patients Only)       Balance                                             Pertinent Vitals/Pain Pain Assessment Pain Assessment: 0-10 Pain Score: 7  Pain Location: left hip Pain Descriptors / Indicators: Operative site guarding, Discomfort, Crying, Grimacing Pain Intervention(s): Limited activity within patient's tolerance, Monitored during session, Repositioned, Ice applied    Home Living Family/patient expects to be discharged to:: Private residence Living Arrangements: Children;Other relatives (Eldest Son (not present) and Grandson) Available Help at Discharge: Family;Available 24 hours/day Type of Home: House Home Access: Level entry     Alternate Level Stairs-Number of Steps: 3 Home Layout: Multi-level Home Equipment: None      Prior Function Prior Level of Function : Independent/Modified Independent;History of Falls (last six months)             Mobility Comments: Falls: trying to get out of bed and rolled out of bed on the floor had to call EMS to get back into bed denies seeking care, reports son did not hear him calling out for help. ADLs Comments: IND     Hand Dominance        Extremity/Trunk Assessment   Upper Extremity Assessment Upper Extremity Assessment: Overall WFL for tasks assessed    Lower Extremity Assessment Lower Extremity Assessment: LLE deficits/detail;RLE deficits/detail RLE Deficits / Details: MMT ank DF/PF 5/5 RLE Sensation: WNL LLE Deficits / Details: MMT ank DF/PF 5/5 LLE Sensation: WNL    Cervical / Trunk Assessment Cervical / Trunk Assessment: Kyphotic  Communication   Communication: No difficulties  Cognition Arousal/Alertness:  Awake/alert Behavior During Therapy: WFL for tasks assessed/performed Overall Cognitive Status: Within Functional Limits for tasks assessed                                          General Comments General comments (skin integrity, edema, etc.): Son Maxwell Li and Daughter Maxwell Li present    Exercises Total Joint Exercises Ankle Circles/Pumps: AROM, Both, 10 reps   Assessment/Plan    PT Assessment Patient needs continued PT services  PT Problem List Decreased strength;Decreased range of motion;Decreased activity tolerance;Decreased balance;Decreased mobility;Pain       PT Treatment Interventions DME instruction;Gait training;Stair training;Functional mobility training;Therapeutic activities;Therapeutic exercise;Balance training;Neuromuscular re-education;Patient/family education    PT Goals (Current goals can be found in the Care Plan section)  Acute Rehab PT Goals Patient Stated Goal: To walk with less pain PT Goal Formulation: With patient/family Time For Goal Achievement: 02/16/22 Potential to Achieve Goals: Good    Frequency 7X/week     Co-evaluation               AM-PAC PT "6 Clicks" Mobility  Outcome Measure Help needed turning from your back to your side  while in a flat bed without using bedrails?: None Help needed moving from lying on your back to sitting on the side of a flat bed without using bedrails?: A Little Help needed moving to and from a bed to a chair (including a wheelchair)?: A Little Help needed standing up from a chair using your arms (e.g., wheelchair or bedside chair)?: A Little Help needed to walk in hospital room?: A Little Help needed climbing 3-5 steps with a railing? : A Lot 6 Click Score: 18    End of Session Equipment Utilized During Treatment: Gait belt Activity Tolerance: Patient tolerated treatment well Patient left: with call bell/phone within reach;in chair;with chair alarm set;with family/visitor  present;with SCD's reapplied Nurse Communication: Mobility status;Patient requests pain meds PT Visit Diagnosis: Pain;Difficulty in walking, not elsewhere classified (R26.2) Pain - Right/Left: Left Pain - part of body: Hip    Time: 0814-4818 PT Time Calculation (min) (ACUTE ONLY): 36 min   Charges:   PT Evaluation $PT Eval Low Complexity: 1 Low PT Treatments $Gait Training: 8-22 mins        Coolidge Breeze, PT, DPT [facility] Office: [phone] Weekend pager: [phone]  Coolidge Breeze 02/09/2022, 8:06 PM

## 2022-02-09 NOTE — Op Note (Signed)
Operative Note  Date of operation: 02/09/2022 Preoperative diagnosis: Left hip primary osteoarthritis Postoperative diagnosis: Same  Procedure: Left direct anterior total hip arthroplasty  Implants: DePuy Sectra GRIPTION acetabular opponent size 52, 36+4 polyethylene liner, size 4 Actis femoral component with high offset, 36+5 metal head ball  Surgeon: Lind Guest. Ninfa Linden, MD Assistant: Benita Stabile, PA-C  Anesthesia: General EBL: 350 cc Antibiotics: 2 g IV Ancef Complications: None  Indications: The patient is a 67 year old active gentleman with debilitating arthritis of involving his left hip this been well-documented clinical exam and x-ray findings.  He has tried and failed conservative treatment for over a year now and wishes to proceed with a total hip arthroplasty.  We agree with this as well given his clinical exam and x-ray findings.  We did talk at length in detail about the risk of acute blood loss anemia, nerve rest surgery, fracture, infection, DVT, implant failure, dislocation, leg length differences and wound healing issues.  We talked about her goals being hopefully decrease pain, improve mobility and overall improve quality of life.  Procedure description: After informed consent was obtained and the appropriate left hip was marked, the patient was brought to the operating room where general anesthesia was obtained while he is on the stretcher.  I was able to assess his leg length and traction boots were placed on both his feet.  He was then placed supine on the Hana fracture table with a perineal post in place in both legs and inline skeletal traction placed under traction applied.  The hip was then assessed radiographically in terms of the left operative hip and pelvis.  We then had the left hip prepped with ChloraPrep and sterile drapes.  A timeout was called and he was then advised correct patient correct left hip.  An incision was then made just inferior and posterior to  the ASIS and carried slightly obliquely down the leg.  Dissection was carried down to the tensor fascia lata muscle and the tensor fascia was then divided longitudinally to proceed with a direct anterior approach to the hip.  Circumflex vessels were identified and cauterized.  The hip capsule was identified and opened up in L-type format finding a moderate joint effusion.  There was significant osteoarthritis that could be seen around the left femoral head.  Cobra retractors were placed around the medial lateral femoral neck and a femoral neck cut was made with an oscillating saw just proximal to the lesser trochanter and this was completed with an osteotome.  A corkscrew guide was placed in the femoral head and the femoral head was removed in its entirety and found to have a wide area devoid of cartilage.  A bent Hohmann was then placed over the medial acetabular rim and remnants of the acetabular labrum and other debris removed.  We then began reaming under direct visualization and direct fluoroscopy from a size 43 reamer going up to a size 51 reamer with all reamers again placed under direct visualization and direct fluoroscopy.  We then placed the real DePuy sector acetabula component size 52 and a 36+4 polythene liner.  Attention was then turned to the femur.  With the left lower extremity externally rotated to 120 degrees, extended and adducted, a Mueller retractor was placed by the medial calcar and a Hohmann retractor was placed behind the greater trochanter.  The lateral joint capsule was released and a box cutting osteotome was used to enter the femoral canal.  We then began broaching using the Actis broaching  system from a size 0 going up to a size 4.  With a size 4 in place we trialed a high offset femoral neck based off his anatomy and a 36+1.5 trial hip ball.  This was reduced in the pelvis and all that was stable we felt based on his clinical and intraoperative assessment we needed to still go longer.   We dislocated the hip and with the trial components.  We placed the real Actis femoral component size 4 with high offset and the real 36+5 metal hip bone again reduce this in the acetabulum.  We are pleased with leg length, offset, range of motion and stability again assessed radiographically and clinically.  The soft tissue was then irrigated normal saline solution.  The joint capsule was closed with interrupted #1 Ethibond suture followed by #1 Vicryl close the tensor fascia.  0 Vicryl was used to close the deep tissue and 2-0 Vicryl was used to close subcutaneous tissue.  The skin was closed with staples.  An Aquacel dressing was applied.  The patient was awakened, extubated and taken the recovery room in stable addition with all final counts being correct and no complications noted.  Benita Stabile, PA-C did assisted in the entire case and beginning to end and his assistance was medically necessary and appropriate for soft tissue management and retraction, helping guide implant placement and a layered closure of the wound.

## 2022-02-09 NOTE — Anesthesia Postprocedure Evaluation (Signed)
Anesthesia Post Note  Patient: Maxwell Li  Procedure(s) Performed: LEFT TOTAL HIP ARTHROPLASTY ANTERIOR APPROACH (Left: Hip)     Patient location during evaluation: PACU Anesthesia Type: General Level of consciousness: awake Pain management: pain level controlled Vital Signs Assessment: post-procedure vital signs reviewed and stable Respiratory status: spontaneous breathing, nonlabored ventilation and respiratory function stable Cardiovascular status: blood pressure returned to baseline and stable Postop Assessment: no apparent nausea or vomiting Anesthetic complications: yes   Encounter Notable Events  Notable Event Outcome Phase Comment  Difficult to intubate - expected  In Recovery Filed from anesthesia note documentation.    Last Vitals:  Vitals:   02/09/22 1345 02/09/22 1400  BP: 104/63 (!) 107/58  Pulse: 65 66  Resp: 15 10  Temp:  36.4 C  SpO2: 92% 94%    Last Pain:  Vitals:   02/09/22 1345  TempSrc:   PainSc: Asleep                 Nilda Simmer
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
## 2022-02-09 NOTE — Transfer of Care (Signed)
Immediate Anesthesia Transfer of Care Note  Patient: Maxwell Li  Procedure(s) Performed: LEFT TOTAL HIP ARTHROPLASTY ANTERIOR APPROACH (Left: Hip)  Patient Location: PACU  Anesthesia Type:General  Level of Consciousness: awake, alert , and patient cooperative  Airway & Oxygen Therapy: Patient Spontanous Breathing and Patient connected to face mask oxygen  Post-op Assessment: Report given to RN and Post -op Vital signs reviewed and stable  Post vital signs: Reviewed and stable  Last Vitals:  Vitals Value Taken Time  BP 136/76 02/09/22 1307  Temp    Pulse 65 02/09/22 1309  Resp 12 02/09/22 1309  SpO2 100 % 02/09/22 1309  Vitals shown include unvalidated device data.  Last Pain:  Vitals:   02/09/22 1046  TempSrc:   PainSc: 7       Patients Stated Pain Goal: 4 (72/89/79 1504)  Complications: No notable events documented.

## 2022-02-09 NOTE — Anesthesia Procedure Notes (Signed)
Procedure Name: Intubation Date/Time: 02/09/2022 11:40 AM  Performed by: Eben Burow, CRNAPre-anesthesia Checklist: Patient identified, Emergency Drugs available, Suction available, Patient being monitored and Timeout performed Patient Re-evaluated:Patient Re-evaluated prior to induction Oxygen Delivery Method: Circle system utilized Preoxygenation: Pre-oxygenation with 100% oxygen Induction Type: IV induction Ventilation: Mask ventilation without difficulty Laryngoscope Size: Glidescope and 3 Grade View: Grade I Tube type: Oral Tube size: 7.5 mm Number of attempts: 1 Airway Equipment and Method: Stylet Placement Confirmation: ETT inserted through vocal cords under direct vision, positive ETCO2 and breath sounds checked- equal and bilateral Secured at: 22 cm Tube secured with: Tape Dental Injury: Teeth and Oropharynx as per pre-operative assessment  Difficulty Due To: Difficulty was anticipated

## 2022-02-09 NOTE — Interval H&P Note (Signed)
History and Physical Interval Note: The patient understands fully that he is here today for a left hip replacement to treat his severe left hip arthritis.  There is been no acute or interval changes in medical status.  See H&P.  The risks and benefits of surgery been discussed in detail and informed consent is obtained.  The left operative hip has been marked.  02/09/2022 10:21 AM  Maxwell Li  has presented today for surgery, with the diagnosis of left hip osteoarthritis.  The various methods of treatment have been discussed with the patient and family. After consideration of risks, benefits and other options for treatment, the patient has consented to  Procedure(s): LEFT TOTAL HIP ARTHROPLASTY ANTERIOR APPROACH (Left) as a surgical intervention.  The patient's history has been reviewed, patient examined, no change in status, stable for surgery.  I have reviewed the patient's chart and labs.  Questions were answered to the patient's satisfaction.     Mcarthur Rossetti

## 2022-02-09 NOTE — TOC Transition Note (Signed)
Transition of Care [facility]) - CM/SW Discharge Note  Patient Details  Name: Maxwell Li MRN: [id number] Date of Birth: 09-Feb-1955  Transition of Care [facility]) CM/SW Contact:  Sherie Don, LCSW Phone Number: 02/09/2022, 4:08 PM  Clinical Narrative: Patient is expected to discharge home after working with PT. CSW met with patient and and his daughter, Judson Roch, to confirm discharge plan and needs. Patient will go home with HHPT, which was prearranged with [place]. Patient will need a rolling walker, which was ordered through [place] with [employer]. [employer] to deliver walker to patient's room tomorrow morning. TOC signing off.  Final next level of care: Home w Home Health Services Barriers to Discharge: No Barriers Identified  Patient Goals and CMS Choice CMS Medicare.gov Compare Post Acute Care list provided to:: Patient Choice offered to / list presented to : Patient  Discharge Plan and Services Additional resources added to the After Visit Summary for          DME Arranged: Walker rolling DME Agency: [employer] Date DME Agency Contacted: 02/09/22 Representative spoke with at DME Agency: Brenton Grills HH Arranged: PT [place]: [place] Representative spoke with at [place] in orthopedist's office  Social Determinants of Health ([place]) Interventions SDOH Screenings   Food Insecurity: No Food Insecurity (02/09/2022)  Housing: Low Risk  (02/09/2022)  Transportation Needs: No Transportation Needs (02/09/2022)  Utilities: Not At Risk (02/09/2022)  Tobacco Use: Medium Risk (02/09/2022)   Readmission Risk Interventions     No data to display

## 2022-02-10 ENCOUNTER — Other Ambulatory Visit: Payer: Self-pay | Admitting: Orthopaedic Surgery

## 2022-02-10 LAB — CBC
HCT: 31.1 % — ABNORMAL LOW (ref 39.0–52.0)
Hemoglobin: 10.3 g/dL — ABNORMAL LOW (ref 13.0–17.0)
MCH: 32.1 pg (ref 26.0–34.0)
MCHC: 33.1 g/dL (ref 30.0–36.0)
MCV: 96.9 fL (ref 80.0–100.0)
Platelets: 115 10*3/uL — ABNORMAL LOW (ref 150–400)
RBC: 3.21 MIL/uL — ABNORMAL LOW (ref 4.22–5.81)
RDW: 13.2 % (ref 11.5–15.5)
WBC: 2.9 10*3/uL — ABNORMAL LOW (ref 4.0–10.5)
nRBC: 0 % (ref 0.0–0.2)

## 2022-02-10 LAB — BASIC METABOLIC PANEL
Anion gap: 7 (ref 5–15)
BUN: 14 mg/dL (ref 8–23)
CO2: 31 mmol/L (ref 22–32)
Calcium: 8.1 mg/dL — ABNORMAL LOW (ref 8.9–10.3)
Chloride: 93 mmol/L — ABNORMAL LOW (ref 98–111)
Creatinine, Ser: 0.76 mg/dL (ref 0.61–1.24)
GFR, Estimated: >60 mL/min (ref >60)
Glucose, Bld: 116 mg/dL — ABNORMAL HIGH (ref 70–99)
Potassium: 4.6 mmol/L (ref 3.5–5.1)
Sodium: 131 mmol/L — ABNORMAL LOW (ref 135–145)

## 2022-02-10 MED ORDER — METHOCARBAMOL 500 MG PO TABS: 500.0000 mg | ORAL_TABLET | Freq: Four times a day (QID) | ORAL | 0 refills | Status: DC | PRN

## 2022-02-10 MED ORDER — ASPIRIN 81 MG PO CHEW: 81.0000 mg | CHEWABLE_TABLET | Freq: Two times a day (BID) | ORAL | 0 refills | Status: AC

## 2022-02-10 MED ORDER — OXYCODONE HCL 5 MG PO TABS: 5.0000 mg | ORAL_TABLET | Freq: Four times a day (QID) | ORAL | 0 refills | Status: DC | PRN

## 2022-02-10 NOTE — Progress Notes (Signed)
  Subjective: Maxwell Li is a 67 y.o. male s/p left THA.  They are POD 1.  Pt's pain is controlled.  Pt has ambulated with some difficulty.  Was able to ambulate about 40 feet with physical therapy yesterday and proximately 60 feet with physical therapy this morning.  He denies any chest pain, shortness of breath, calf pain, fevers/chills. Hoping to go home on Sunday/Monday as his home situation is not ideal; his grandson is fairly rowdy but only there on weekends  Objective: Vital signs in last 24 hours: Temp:  [97.6 F (36.4 C)-98.2 F (36.8 C)] 97.7 F (36.5 C) (02/03 0814) Pulse Rate:  [64-86] 86 (02/03 0814) Resp:  [10-21] 18 (02/03 0814) BP: (100-141)/(58-87) 133/72 (02/03 0814) SpO2:  [91 %-100 %] 94 % (02/03 0814) Weight:  [76.1 kg] 76.1 kg (02/02 1102)  Intake/Output from previous day: 02/02 0701 - 02/03 0700 In: 3640.3 [P.O.:1320; I.V.:2020.3; IV Piggyback:300] Out: 2150 [Urine:1850; Blood:300] Intake/Output this shift: Total I/O In: 240 [P.O.:240] Out: 350 [Urine:350]  Exam:  No gross blood or drainage overlying the dressing 2+ DP pulse Sensation intact distally in the left foot Able to dorsiflex and plantarflex the left foot Leg lengths are equal No calf tenderness.  Negative Homans' sign   Labs: Recent Labs    02/09/22 1006 02/10/22 0328  HGB 12.9* 10.3*   Recent Labs    02/09/22 1006 02/10/22 0328  WBC 1.8* 2.9*  RBC 4.03* 3.21*  HCT 38.9* 31.1*  PLT 124* 115*   Recent Labs    02/10/22 0328  NA 131*  K 4.6  CL 93*  CO2 31  BUN 14  CREATININE 0.76  GLUCOSE 116*  CALCIUM 8.1*   No results for input(s): "LABPT", "INR" in the last 72 hours.  Assessment/Plan: Pt is POD 1 s/p left THA.    -Plan to discharge to home likely tomorrow pending patient's pain and PT eval  -WBAT with a walker  -Okay to shower, dressing is waterproof.  Cautioned patient against soaking dressing in bath/pool/body of water     [facility] 02/10/2022,  10:04 AM

## 2022-02-10 NOTE — Progress Notes (Signed)
Physical Therapy Treatment Patient Details Name: Maxwell Li MRN: [id number] DOB: 1955/10/06 Today's Date: 02/10/2022   History of Present Illness Pt is a 67yo male presenting s/p L-THA, AA on 02/09/22. PMH: Crohn's disease, GERD, HTN, pacemaker.    PT Comments    Pt continues to progress toward PT goals, should be ready for tomorrow if continues to progress     Recommendations for follow up therapy are one component of a multi-disciplinary discharge planning process, led by the attending physician.  Recommendations may be updated based on patient status, additional functional criteria and insurance authorization.  Follow Up Recommendations  Follow physician's recommendations for discharge plan and follow up therapies     Assistance Recommended at Discharge Frequent or constant Supervision/Assistance  Patient can return home with the following A little help with walking and/or transfers;A little help with bathing/dressing/bathroom;Assistance with cooking/housework;Assist for transportation;Help with stairs or ramp for entrance   Equipment Recommendations  Rolling walker (2 wheels)    Recommendations for Other Services       Precautions / Restrictions Precautions Precautions: Fall Precaution Comments: Pt had fall Saturday night out of bed (1/27) Restrictions Weight Bearing Restrictions: No LLE Weight Bearing: Weight bearing as tolerated     Mobility  Bed Mobility Overal bed mobility: Needs Assistance Bed Mobility: Sit to Supine     Supine to sit: Min guard, HOB elevated, Min assist Sit to supine: Min guard, Min assist   General bed mobility comments: light assist with LLE, pt using gait belt to self assist    Transfers Overall transfer level: Needs assistance Equipment used: Rolling walker (2 wheels) Transfers: Sit to/from Stand Sit to Stand: Min guard           General transfer comment: For safety only, no physical assist required, VCs for sequencing and  hand placement    Ambulation/Gait Ambulation/Gait assistance: Min guard, Supervision Gait Distance (Feet): 80 Feet Assistive device: Rolling walker (2 wheels) Gait Pattern/deviations: Step-to pattern, Decreased stance time - left, Decreased step length - left, Decreased weight shift to left, Step-through pattern Gait velocity: decreased     General Gait Details: progressing to step through gait end of distance; cues for initial sequence and progression, steady with RW, no overt  LOB   Stairs             Wheelchair Mobility    Modified Rankin (Stroke Patients Only)       Balance                                            Cognition Arousal/Alertness: Awake/alert Behavior During Therapy: WFL for tasks assessed/performed Overall Cognitive Status: Within Functional Limits for tasks assessed                                          Exercises Total Joint Exercises Ankle Circles/Pumps: AROM, Both, 10 reps Quad Sets: AROM, Both, 5 reps Heel Slides: AAROM, Left, 10 reps Hip ABduction/ADduction: AAROM, Left, 10 reps    General Comments        Pertinent Vitals/Pain Pain Assessment Pain Assessment: 0-10 Pain Score: 4  Pain Location: left hip Pain Descriptors / Indicators: Operative site guarding, Discomfort, Crying, Grimacing Pain Intervention(s): Limited activity within patient's tolerance, Monitored during session, Premedicated before session, Repositioned  Home Living                          Prior Function            PT Goals (current goals can now be found in the care plan section) Acute Rehab PT Goals Patient Stated Goal: To walk with less pain PT Goal Formulation: With patient/family Time For Goal Achievement: 02/16/22 Potential to Achieve Goals: Good Progress towards PT goals: Progressing toward goals    Frequency    7X/week      PT Plan Current plan remains appropriate    Co-evaluation               AM-PAC PT "6 Clicks" Mobility   Outcome Measure  Help needed turning from your back to your side while in a flat bed without using bedrails?: None Help needed moving from lying on your back to sitting on the side of a flat bed without using bedrails?: A Little Help needed moving to and from a bed to a chair (including a wheelchair)?: A Little Help needed standing up from a chair using your arms (e.g., wheelchair or bedside chair)?: A Little Help needed to walk in hospital room?: A Little Help needed climbing 3-5 steps with a railing? : A Lot 6 Click Score: 18    End of Session Equipment Utilized During Treatment: Gait belt Activity Tolerance: Patient tolerated treatment well Patient left: with call bell/phone within reach;in chair;with chair alarm set Nurse Communication: Mobility status PT Visit Diagnosis: Pain;Difficulty in walking, not elsewhere classified (R26.2) Pain - Right/Left: Left Pain - part of body: Hip     Time: 6333-5456 PT Time Calculation (min) (ACUTE ONLY): 14 min  Charges:  $Gait Training: 8-22 mins                     Baxter Flattery, PT  Acute Rehab Dept [facility]) [phone]  [facility] Weekend Pager ([place] only)  [phone]  02/10/2022    [facility] 02/10/2022, 1:54 PM

## 2022-02-10 NOTE — Progress Notes (Signed)
Physical Therapy Treatment Patient Details Name: Maxwell Li MRN: [id number] DOB: 20-Sep-1955 Today's Date: 02/10/2022   History of Present Illness Pt is a 66yo male presenting s/p L-THA, AA on 02/09/22. PMH: Crohn's disease, GERD, HTN, pacemaker.    PT Comments    Pt progressing well this session, incr amb distance, pain controlled. Pt concerned about d/c home today bc his 46yo grandson is there and son may not be able to assist him (pt) as much;  reports grandson is leaving tomorrow and Maxwell Li should free up his son to assist. Anticipate continue progress in acute setting  Recommendations for follow up therapy are one component of a multi-disciplinary discharge planning process, led by the attending physician.  Recommendations may be updated based on patient status, additional functional criteria and insurance authorization.  Follow Up Recommendations  Follow physician's recommendations for discharge plan and follow up therapies     Assistance Recommended at Discharge Frequent or constant Supervision/Assistance  Patient can return home with the following A little help with walking and/or transfers;A little help with bathing/dressing/bathroom;Assistance with cooking/housework;Assist for transportation;Help with stairs or ramp for entrance   Equipment Recommendations  Rolling walker (2 wheels)    Recommendations for Other Services       Precautions / Restrictions Precautions Precautions: Fall Precaution Comments: Pt had fall Saturday night out of bed (1/27) Restrictions Weight Bearing Restrictions: No LLE Weight Bearing: Weight bearing as tolerated     Mobility  Bed Mobility Overal bed mobility: Needs Assistance Bed Mobility: Supine to Sit     Supine to sit: Min guard, HOB elevated, Min assist     General bed mobility comments: light assist with LLE    Transfers Overall transfer level: Needs assistance Equipment used: Rolling walker (2 wheels) Transfers: Sit to/from  Stand Sit to Stand: Min guard           General transfer comment: For safety only, no physical assist required, VCs for sequencing and hand placement    Ambulation/Gait Ambulation/Gait assistance: Min guard Gait Distance (Feet): 60 Feet Assistive device: Rolling walker (2 wheels) Gait Pattern/deviations: Step-to pattern, Decreased stance time - left, Decreased step length - left, Decreased weight shift to left, Step-through pattern Gait velocity: decreased     General Gait Details: progressing to step through gait end of distance; cues for initial sequence and progression, steady with RW, no overt  LOB   Stairs             Wheelchair Mobility    Modified Rankin (Stroke Patients Only)       Balance                                            Cognition Arousal/Alertness: Awake/alert Behavior During Therapy: WFL for tasks assessed/performed Overall Cognitive Status: Within Functional Limits for tasks assessed                                          Exercises Total Joint Exercises Ankle Circles/Pumps: AROM, Both, 10 reps Quad Sets: AROM, Both, 5 reps    General Comments        Pertinent Vitals/Pain Pain Assessment Pain Assessment: 0-10 Pain Score: 4  Pain Location: left hip Pain Descriptors / Indicators: Operative site guarding, Discomfort, Crying, Grimacing Pain Intervention(s): Limited  activity within patient's tolerance, Monitored during session, Premedicated before session, Repositioned    Home Living                          Prior Function            PT Goals (current goals can now be found in the care plan section) Acute Rehab PT Goals Patient Stated Goal: To walk with less pain PT Goal Formulation: With patient/family Time For Goal Achievement: 02/16/22 Potential to Achieve Goals: Good Progress towards PT goals: Progressing toward goals    Frequency    7X/week      PT Plan Current  plan remains appropriate    Co-evaluation              AM-PAC PT "6 Clicks" Mobility   Outcome Measure  Help needed turning from your back to your side while in a flat bed without using bedrails?: None Help needed moving from lying on your back to sitting on the side of a flat bed without using bedrails?: A Little Help needed moving to and from a bed to a chair (including a wheelchair)?: A Little Help needed standing up from a chair using your arms (e.g., wheelchair or bedside chair)?: A Little Help needed to walk in hospital room?: A Little Help needed climbing 3-5 steps with a railing? : A Lot 6 Click Score: 18    End of Session Equipment Utilized During Treatment: Gait belt Activity Tolerance: Patient tolerated treatment well Patient left: with call bell/phone within reach;in chair;with chair alarm set Nurse Communication: Mobility status PT Visit Diagnosis: Pain;Difficulty in walking, not elsewhere classified (R26.2) Pain - Right/Left: Left Pain - part of body: Hip     Time: 5400-8676 PT Time Calculation (min) (ACUTE ONLY): 18 min  Charges:  $Gait Training: 8-22 mins                     Baxter Flattery, PT  Acute Rehab Dept [facility]) [phone]  [facility] Weekend Pager (Saturday/Sunday only)  [phone]  02/10/2022    [facility] 02/10/2022, 10:18 AM

## 2022-02-10 NOTE — Plan of Care (Signed)

## 2022-02-10 NOTE — Progress Notes (Signed)
Subjective: 1 Day Post-Op Procedure(s) (LRB): LEFT TOTAL HIP ARTHROPLASTY ANTERIOR APPROACH (Left) Patient reports pain as moderate.    Objective: Vital signs in last 24 hours: Temp:  [97.6 F (36.4 C)-98.2 F (36.8 C)] 97.7 F (36.5 C) (02/03 0814) Pulse Rate:  [64-86] 86 (02/03 0814) Resp:  [10-21] 18 (02/03 0814) BP: (100-141)/(58-87) 133/72 (02/03 0814) SpO2:  [91 %-100 %] 94 % (02/03 0814) Weight:  [76.1 kg] 76.1 kg (02/02 1102)  Intake/Output from previous day: 02/02 0701 - 02/03 0700 In: 3640.3 [P.O.:1320; I.V.:2020.3; IV Piggyback:300] Out: 2150 [Urine:1850; Blood:300] Intake/Output this shift: Total I/O In: 240 [P.O.:240] Out: 500 [Urine:500]  Recent Labs    02/09/22 1006 02/10/22 0328  HGB 12.9* 10.3*   Recent Labs    02/09/22 1006 02/10/22 0328  WBC 1.8* 2.9*  RBC 4.03* 3.21*  HCT 38.9* 31.1*  PLT 124* 115*   Recent Labs    02/10/22 0328  NA 131*  K 4.6  CL 93*  CO2 31  BUN 14  CREATININE 0.76  GLUCOSE 116*  CALCIUM 8.1*   No results for input(s): "LABPT", "INR" in the last 72 hours.  Sensation intact distally Intact pulses distally Dorsiflexion/Plantar flexion intact Incision: scant drainage   Assessment/Plan: 1 Day Post-Op Procedure(s) (LRB): LEFT TOTAL HIP ARTHROPLASTY ANTERIOR APPROACH (Left) Up with therapy Plan for discharge tomorrow Discharge home with home health      Mcarthur Rossetti 02/10/2022, 10:43 AM

## 2022-02-10 NOTE — Discharge Instructions (Addendum)

## 2022-02-11 ENCOUNTER — Emergency Department (HOSPITAL_COMMUNITY): Payer: Medicare HMO

## 2022-02-11 ENCOUNTER — Other Ambulatory Visit: Payer: Self-pay

## 2022-02-11 ENCOUNTER — Encounter (HOSPITAL_COMMUNITY): Payer: Self-pay

## 2022-02-11 ENCOUNTER — Emergency Department (HOSPITAL_COMMUNITY)
Admission: EM | Admit: 2022-02-11 | Discharge: 2022-02-11 | Disposition: A | Discharge status: Home / Self Care | Payer: Medicare HMO | Source: Home / Self Care | Attending: Emergency Medicine | Admitting: Emergency Medicine

## 2022-02-11 DIAGNOSIS — Z7982 Long term (current) use of aspirin: Secondary | ICD-10-CM | POA: Insufficient documentation

## 2022-02-11 DIAGNOSIS — Z950 Presence of cardiac pacemaker: Secondary | ICD-10-CM | POA: Insufficient documentation

## 2022-02-11 DIAGNOSIS — I959 Hypotension, unspecified: Secondary | ICD-10-CM | POA: Insufficient documentation

## 2022-02-11 DIAGNOSIS — Z96642 Presence of left artificial hip joint: Secondary | ICD-10-CM | POA: Insufficient documentation

## 2022-02-11 DIAGNOSIS — I10 Essential (primary) hypertension: Secondary | ICD-10-CM | POA: Insufficient documentation

## 2022-02-11 DIAGNOSIS — R55 Syncope and collapse: Secondary | ICD-10-CM

## 2022-02-11 DIAGNOSIS — Z79899 Other long term (current) drug therapy: Secondary | ICD-10-CM | POA: Insufficient documentation

## 2022-02-11 LAB — CBC
HCT: 31.3 % — ABNORMAL LOW (ref 39.0–52.0)
Hemoglobin: 10.6 g/dL — ABNORMAL LOW (ref 13.0–17.0)
MCH: 32.5 pg (ref 26.0–34.0)
MCHC: 33.9 g/dL (ref 30.0–36.0)
MCV: 96 fL (ref 80.0–100.0)
Platelets: 107 10*3/uL — ABNORMAL LOW (ref 150–400)
RBC: 3.26 MIL/uL — ABNORMAL LOW (ref 4.22–5.81)
RDW: 13.7 % (ref 11.5–15.5)
WBC: 4 10*3/uL (ref 4.0–10.5)
nRBC: 0 % (ref 0.0–0.2)

## 2022-02-11 LAB — URINALYSIS, ROUTINE W REFLEX MICROSCOPIC
Bilirubin Urine: NEGATIVE
Glucose, UA: NEGATIVE mg/dL
Hgb urine dipstick: NEGATIVE
Ketones, ur: NEGATIVE mg/dL
Leukocytes,Ua: NEGATIVE
Nitrite: NEGATIVE
Protein, ur: NEGATIVE mg/dL
Specific Gravity, Urine: 1.034 — ABNORMAL HIGH (ref 1.005–1.030)
pH: 5 (ref 5.0–8.0)

## 2022-02-11 LAB — BASIC METABOLIC PANEL
Anion gap: 13 (ref 5–15)
BUN: 15 mg/dL (ref 8–23)
CO2: 33 mmol/L — ABNORMAL HIGH (ref 22–32)
Calcium: 7.7 mg/dL — ABNORMAL LOW (ref 8.9–10.3)
Chloride: 84 mmol/L — ABNORMAL LOW (ref 98–111)
Creatinine, Ser: 0.82 mg/dL (ref 0.61–1.24)
GFR, Estimated: >60 mL/min (ref >60)
Glucose, Bld: 121 mg/dL — ABNORMAL HIGH (ref 70–99)
Potassium: 3.7 mmol/L (ref 3.5–5.1)
Sodium: 130 mmol/L — ABNORMAL LOW (ref 135–145)

## 2022-02-11 LAB — CBG MONITORING, ED: Glucose-Capillary: 140 mg/dL — ABNORMAL HIGH (ref 70–99)

## 2022-02-11 LAB — TROPONIN I (HIGH SENSITIVITY)
Troponin I (High Sensitivity): 4 ng/L (ref <18)
Troponin I (High Sensitivity): 4 ng/L (ref <18)

## 2022-02-11 MED ORDER — IOHEXOL 350 MG/ML SOLN
75.0000 mL | Freq: Once | INTRAVENOUS | Status: AC | PRN
  Administered 2022-02-11: 75 mL via INTRAVENOUS

## 2022-02-11 MED ORDER — SODIUM CHLORIDE 0.9 % IV SOLN: INTRAVENOUS | Status: DC

## 2022-02-11 MED ORDER — OXYCODONE HCL 5 MG PO TABS
5.0000 mg | ORAL_TABLET | Freq: Once | ORAL | Status: AC
  Administered 2022-02-11: 5 mg via ORAL
  Filled 2022-02-11: qty 1

## 2022-02-11 MED ORDER — SODIUM CHLORIDE 0.9 % IV BOLUS
1000.0000 mL | Freq: Once | INTRAVENOUS | Status: AC
  Administered 2022-02-11: 1000 mL via INTRAVENOUS

## 2022-02-11 MED ORDER — OXYCODONE HCL 5 MG PO TABS
5.0000 mg | ORAL_TABLET | Freq: Once | ORAL | Status: DC
  Filled 2022-02-11: qty 1

## 2022-02-11 NOTE — ED Provider Triage Note (Signed)
Emergency Medicine Provider Triage Evaluation Note  Maxwell Li , a 67 y.o. male  was evaluated in triage.  Pt complains of near syncopal episode.  Patient was discharged from the hospital approximately 2 hours ago after having left hip replacement 2 days ago.  While in the car returning home, they were making some stops, when patient suddenly became diaphoretic with some associated shortness of breath.  No significant chest pain.  He states that his vision "went dark".  EMS was called and he was found to have low blood pressure.  He did receive pain medication and muscle relaxer just prior to discharge.  He has a pacemaker.  States that currently he is feeling better.  Review of Systems  Positive: Near syncope, shortness of breath Negative: Leg swelling  Physical Exam  BP 107/60 (BP Location: Right Arm)   Pulse 79   Temp 98.9 F (37.2 C) (Oral)   Resp 17   Wt 76 kg   SpO2 94%   BMI 29.68 kg/m  Gen:   Awake, no distress   Resp:  Normal effort  MSK:   Moves extremities without difficulty  Other:  Regular rhythm  Medical Decision Making  Medically screening exam initiated at 3:44 PM.  Appropriate orders placed.  Alexes C Welton was informed that the remainder of the evaluation will be completed by another provider, this initial triage assessment does not replace that evaluation, and the importance of remaining in the ED until their evaluation is complete.     Carlisle Cater, PA-C 02/11/22 1545

## 2022-02-11 NOTE — Plan of Care (Signed)
Pt ready to DC home with family. 

## 2022-02-11 NOTE — Progress Notes (Signed)
02/11/22 1200  PT Visit Information  Last PT Received On 02/11/22  Assistance Needed Pt  progressing well, reviewed areas below in addition to stairs; pt did well, feels ready to d/c with son's assist.    History of Present Illness Pt is a 67yo male presenting s/p L-THA, AA on 02/09/22. PMH: Crohn's disease, GERD, HTN, pacemaker.  Subjective Data  Patient Stated Goal To walk with less pain  Precautions  Precautions Fall  Restrictions  LLE Weight Bearing WBAT  Pain Assessment  Pain Assessment 0-10  Pain Score 5  Pain Location left hip  Pain Descriptors / Indicators Operative site guarding;Discomfort;Grimacing  Pain Intervention(s) Limited activity within patient's tolerance;Monitored during session;Premedicated before session  Cognition  Arousal/Alertness Awake/alert  Behavior During Therapy WFL for tasks assessed/performed  Overall Cognitive Status Within Functional Limits for tasks assessed  Bed Mobility  General bed mobility comments in recliner  Transfers  Overall transfer level Needs assistance  Equipment used Rolling walker (2 wheels)  Transfers Sit to/from Stand  Sit to Stand Supervision  General transfer comment For safety only, no physical assist required, VCs for sequencing and hand placement  Ambulation/Gait  Ambulation/Gait assistance Supervision  Gait Distance (Feet) 80 Feet  Assistive device Rolling walker (2 wheels)  Gait Pattern/deviations Step-to pattern;Decreased stance time - left;Decreased step length - left;Decreased weight shift to left;Step-through pattern  General Gait Details cues for sequence, no LOB, improved wt shift to LLE  Stairs Yes  Stairs assistance Min guard  Stair Management One rail Right;Step to pattern;With cane;Forwards (pt used "door knob or chair rail" and cane prior to sx)  Number of Stairs 3  General stair comments cues for sequence, min/guard for safety; no LOB  PT - End of Session  Equipment Utilized During Treatment Gait belt   Activity Tolerance Patient tolerated treatment well  Patient left with call bell/phone within reach;in chair;with chair alarm set  Nurse Communication Mobility status   PT - Assessment/Plan  PT Plan Current plan remains appropriate  PT Visit Diagnosis Pain;Difficulty in walking, not elsewhere classified (R26.2)  Pain - Right/Left Left  Pain - part of body Hip  PT Frequency (ACUTE ONLY) 7X/week  Follow Up Recommendations Follow physician's recommendations for discharge plan and follow up therapies  Assistance recommended at discharge Frequent or constant Supervision/Assistance  Patient can return home with the following A little help with walking and/or transfers;A little help with bathing/dressing/bathroom;Assistance with cooking/housework;Assist for transportation;Help with stairs or ramp for entrance  PT equipment Rolling walker (2 wheels)  AM-PAC PT "6 Clicks" Mobility Outcome Measure (Version 2)  Help needed turning from your back to your side while in a flat bed without using bedrails? 4  Help needed moving from lying on your back to sitting on the side of a flat bed without using bedrails? 3  Help needed moving to and from a bed to a chair (including a wheelchair)? 3  Help needed standing up from a chair using your arms (e.g., wheelchair or bedside chair)? 3  Help needed to walk in hospital room? 3  Help needed climbing 3-5 steps with a railing?  3  6 Click Score 19  Consider Recommendation of Discharge To: Home with [facility]  PT Goal Progression  Progress towards PT goals Progressing toward goals  Acute Rehab PT Goals  PT Goal Formulation With patient/family  Time For Goal Achievement 02/16/22  Potential to Achieve Goals Good  PT Time Calculation  PT Start Time (ACUTE ONLY) 1218  PT Stop Time (ACUTE  ONLY) 1231  PT Time Calculation (min) (ACUTE ONLY) 13 min  PT General Charges  $$ ACUTE PT VISIT 1 Visit  PT Treatments  $Gait Training 8-22 mins

## 2022-02-11 NOTE — Progress Notes (Signed)
Physical Therapy Treatment Patient Details Name: Maxwell Li MRN: [id number] DOB: 01-14-1955 Today's Date: 02/11/2022   History of Present Illness Pt is a 67yo male presenting s/p L-THA, AA on 02/09/22. PMH: Crohn's disease, GERD, HTN, pacemaker.    PT Comments    Pt progressing however feels he is a little more sore than yesterday. Will see again to review steps pt hs to enter his living area   Recommendations for follow up therapy are one component of a multi-disciplinary discharge planning process, led by the attending physician.  Recommendations may be updated based on patient status, additional functional criteria and insurance authorization.  Follow Up Recommendations  Follow physician's recommendations for discharge plan and follow up therapies     Assistance Recommended at Discharge Frequent or constant Supervision/Assistance  Patient can return home with the following A little help with walking and/or transfers;A little help with bathing/dressing/bathroom;Assistance with cooking/housework;Assist for transportation;Help with stairs or ramp for entrance   Equipment Recommendations  Rolling walker (2 wheels)    Recommendations for Other Services       Precautions / Restrictions Precautions Precautions: Fall Restrictions LLE Weight Bearing: Weight bearing as tolerated     Mobility  Bed Mobility Overal bed mobility: Needs Assistance Bed Mobility: Supine to Sit     Supine to sit: Min assist, Min guard     General bed mobility comments: light assist with LLE, pt using gait belt to self assist    Transfers Overall transfer level: Needs assistance Equipment used: Rolling walker (2 wheels) Transfers: Sit to/from Stand Sit to Stand: Supervision           General transfer comment: For safety only, no physical assist required, VCs for sequencing and hand placement    Ambulation/Gait Ambulation/Gait assistance: Min guard, Supervision Gait Distance (Feet):  90 Feet Assistive device: Rolling walker (2 wheels) Gait Pattern/deviations: Step-to pattern, Decreased stance time - left, Decreased step length - left, Decreased weight shift to left, Step-through pattern       General Gait Details: cues for sequence, no LOB   Stairs             Wheelchair Mobility    Modified Rankin (Stroke Patients Only)       Balance                                            Cognition Arousal/Alertness: Awake/alert Behavior During Therapy: WFL for tasks assessed/performed Overall Cognitive Status: Within Functional Limits for tasks assessed                                          Exercises Total Joint Exercises Ankle Circles/Pumps: AROM, Both, 10 reps Quad Sets: AROM, Both, 10 reps Long Arc Quad: AROM, Left, 5 reps, Seated    General Comments        Pertinent Vitals/Pain Pain Assessment Pain Assessment: 0-10 Pain Score: 6  Pain Location: left hip Pain Descriptors / Indicators: Operative site guarding, Discomfort, Grimacing Pain Intervention(s): Limited activity within patient's tolerance, Monitored during session, Premedicated before session, Repositioned, Ice applied    Home Living                          Prior Function  PT Goals (current goals can now be found in the care plan section) Acute Rehab PT Goals Patient Stated Goal: To walk with less pain PT Goal Formulation: With patient/family Time For Goal Achievement: 02/16/22 Potential to Achieve Goals: Good Progress towards PT goals: Progressing toward goals    Frequency    7X/week      PT Plan Current plan remains appropriate    Co-evaluation              AM-PAC PT "6 Clicks" Mobility   Outcome Measure  Help needed turning from your back to your side while in a flat bed without using bedrails?: None Help needed moving from lying on your back to sitting on the side of a flat bed without using  bedrails?: A Little Help needed moving to and from a bed to a chair (including a wheelchair)?: A Little Help needed standing up from a chair using your arms (e.g., wheelchair or bedside chair)?: A Little Help needed to walk in hospital room?: A Little Help needed climbing 3-5 steps with a railing? : A Little 6 Click Score: 19    End of Session Equipment Utilized During Treatment: Gait belt Activity Tolerance: Patient tolerated treatment well Patient left: with call bell/phone within reach;in chair;with chair alarm set Nurse Communication: Mobility status PT Visit Diagnosis: Pain;Difficulty in walking, not elsewhere classified (R26.2) Pain - Right/Left: Left Pain - part of body: Hip     Time: 9038-3338 PT Time Calculation (min) (ACUTE ONLY): 15 min  Charges:  $Gait Training: 8-22 mins                     Baxter Flattery, PT  Acute Rehab Dept [facility]) [phone]  [facility] Weekend Pager ([place] only)  [phone]  02/11/2022    [facility] 02/11/2022, 10:07 AM

## 2022-02-11 NOTE — Progress Notes (Signed)
  Subjective: Patient stable.  Pain controlled.  Planning to do stairs earlier this afternoon.  Has family support at home.   Objective: Vital signs in last 24 hours: Temp:  [97.9 F (36.6 C)-99.1 F (37.3 C)] 99.1 F (37.3 C) (02/04 0517) Pulse Rate:  [83-95] 95 (02/04 0517) Resp:  [16-18] 18 (02/04 0517) BP: (103-116)/(67-94) 103/79 (02/04 0517) SpO2:  [91 %-97 %] 91 % (02/04 0517)  Intake/Output from previous day: 02/03 0701 - 02/04 0700 In: 1179 [P.O.:1080; I.V.:99] Out: 2875 [Urine:2875] Intake/Output this shift: Total I/O In: -  Out: 175 [Urine:175]  Exam:  Sensation intact distally Intact pulses distally Dorsiflexion/Plantar flexion intact  Labs: Recent Labs    02/09/22 1006 02/10/22 0328  HGB 12.9* 10.3*   Recent Labs    02/09/22 1006 02/10/22 0328  WBC 1.8* 2.9*  RBC 4.03* 3.21*  HCT 38.9* 31.1*  PLT 124* 115*   Recent Labs    02/10/22 0328  NA 131*  K 4.6  CL 93*  CO2 31  BUN 14  CREATININE 0.76  GLUCOSE 116*  CALCIUM 8.1*   No results for input(s): "LABPT", "INR" in the last 72 hours.  Assessment/Plan: Plan at this time is 1 more physical therapy session earlier this afternoon and discharge to home.  Patient will follow-up with Dr. Ninfa Linden in 10 to 63 days   [place] 02/11/2022, 11:37 AM

## 2022-02-11 NOTE — ED Notes (Signed)
Currently interrogating pacemaker. Awaiting reports.

## 2022-02-11 NOTE — Discharge Instructions (Signed)
Do not take your amlodipine or metoprolol if your blood pressure is below 100.  You will need to follow up with your pcp.  Return if worse.

## 2022-02-11 NOTE — ED Triage Notes (Signed)
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

## 2022-02-11 NOTE — ED Provider Notes (Signed)
[place] [facility] DEPARTMENT AT [facility] Provider Note   CSN: [id number] Arrival date & time: 02/11/22  1520     History  Chief Complaint  Patient presents with   Near Syncope    Maxwell Li is a 67 y.o. male.  Pt is a 67 yo male with a pmhx significant for Crohn's disease, CHB s/p pacemaker placement, pancytopenia, dyslipidemia, gerd, htn, and kidney stones.  Pt had a hip replacement on 2/2.  He was d/c from the hospital this afternoon.  He was in the car while his daughter went into the pharmacy to get his meds and felt like everything was going black.  Pt said he felt the same way when he was diagnosed with a complete heart block.  Pt's hgb did drop after surgery, but not enough to transfuse.  He did get pain medication before leaving, but he's been taking them since his surgery and has been fine.  EMS said bp in the 70s when they arrived.  He is feeling better now.       Home Medications Prior to Admission medications   Medication Sig Start Date End Date Taking? Authorizing Provider  acetaminophen (TYLENOL) 650 MG CR tablet Take 1,300 mg by mouth daily.    [provider]  albuterol (VENTOLIN HFA) 108 (90 Base) MCG/ACT inhaler Inhale 2 puffs into the lungs every 6 (six) hours as needed for wheezing or shortness of breath.    [provider]  amLODipine (NORVASC) 5 MG tablet Take 5 mg by mouth daily.    [provider]  aspirin 81 MG chewable tablet Chew 1 tablet (81 mg total) by mouth 2 (two) times daily. 02/10/22   Mcarthur Rossetti, MD  Cholecalciferol (VITAMIN D-3) 125 MCG (5000 UT) TABS Take 1 tablet by mouth daily at 2 PM.    [provider]  cyanocobalamin (VITAMIN B12) 1000 MCG tablet Take 1,000 mcg by mouth daily.    [provider]  cyanocobalamin (VITAMIN B12) 1000 MCG/ML injection Inject 1 mL (1,000 mcg total) into the skin every 30 (thirty) days. At PCP office 01/02/22   Pyrtle, Lajuan Lines, MD  folic  acid (FOLVITE) 329 MCG tablet Take 800 mcg by mouth daily.    [provider]  methocarbamol (ROBAXIN) 500 MG tablet Take 1 tablet (500 mg total) by mouth every 6 (six) hours as needed for muscle spasms. 02/10/22   Mcarthur Rossetti, MD  metoprolol tartrate (LOPRESSOR) 25 MG tablet Take 1 tablet (25 mg total) by mouth 2 (two) times daily. 02/08/17 02/09/22  Baldwin Jamaica, PA-C  omeprazole (PRILOSEC) 20 MG capsule Take 1 capsule (20 mg total) by mouth daily. 01/02/22   Pyrtle, Lajuan Lines, MD  oxyCODONE (OXY IR/ROXICODONE) 5 MG immediate release tablet Take 1-2 tablets (5-10 mg total) by mouth every 6 (six) hours as needed for moderate pain (pain score 4-6). 02/10/22   Mcarthur Rossetti, MD  predniSONE (DELTASONE) 5 MG tablet Take 1 tablet (5 mg total) by mouth daily. 01/03/22   Pyrtle, Lajuan Lines, MD  PRESCRIPTION MEDICATION Inhale into the lungs at bedtime. CPAP    [provider]  Risankizumab-rzaa (SKYRIZI) 360 MG/2.4ML SOCT Inject 360 mg into the skin See admin instructions. Inject '360mg'$  every 8 weeks.    [provider]  testosterone cypionate (DEPOTESTOTERONE CYPIONATE) 200 MG/ML injection Inject 1 mL (200 mg total) into the muscle every 14 (fourteen) days. Patient not taking: Reported on 01/29/2022 04/28/13   Renato Shin,  MD      Allergies    Lidocaine, Lovastatin, and Shellfish allergy    Review of Systems   Review of Systems  Neurological:  Positive for light-headedness.  All other systems reviewed and are negative.   Physical Exam Updated Vital Signs BP 113/74   Pulse 98   Temp 98.9 F (37.2 C)   Resp 16   Ht '5\' 3"'$  (1.6 m)   Wt 76.7 kg   SpO2 97%   BMI 29.94 kg/m  Physical Exam Vitals and nursing note reviewed.  Constitutional:      Appearance: Normal appearance.  HENT:     Head: Normocephalic and atraumatic.     Right Ear: External ear normal.     Left Ear: External ear normal.     Nose: Nose normal.     Mouth/Throat:     Mouth: Mucous  membranes are dry.  Eyes:     Extraocular Movements: Extraocular movements intact.     Conjunctiva/sclera: Conjunctivae normal.     Pupils: Pupils are equal, round, and reactive to light.  Cardiovascular:     Rate and Rhythm: Normal rate and regular rhythm.     Pulses: Normal pulses.     Heart sounds: Normal heart sounds.  Pulmonary:     Effort: Pulmonary effort is normal.     Breath sounds: Normal breath sounds.  Abdominal:     General: Abdomen is flat. Bowel sounds are normal.     Palpations: Abdomen is soft.  Musculoskeletal:     Cervical back: Normal range of motion and neck supple.     Comments: S/p left total hip  Skin:    General: Skin is warm.     Capillary Refill: Capillary refill takes less than 2 seconds.  Neurological:     General: No focal deficit present.     Mental Status: He is alert and oriented to person, place, and time.  Psychiatric:        Mood and Affect: Mood normal.        Behavior: Behavior normal.     ED Results / Procedures / Treatments   Labs (all labs ordered are listed, but only abnormal results are displayed) Labs Reviewed  BASIC METABOLIC PANEL - Abnormal; Notable for the following components:      Result Value   Sodium 130 (*)    Chloride 84 (*)    CO2 33 (*)    Glucose, Bld 121 (*)    Calcium 7.7 (*)    All other components within normal limits  CBC - Abnormal; Notable for the following components:   RBC 3.26 (*)    Hemoglobin 10.6 (*)    HCT 31.3 (*)    Platelets 107 (*)    All other components within normal limits  URINALYSIS, ROUTINE W REFLEX MICROSCOPIC - Abnormal; Notable for the following components:   Specific Gravity, Urine 1.034 (*)    All other components within normal limits  CBG MONITORING, ED - Abnormal; Notable for the following components:   Glucose-Capillary 140 (*)    All other components within normal limits  TROPONIN I (HIGH SENSITIVITY)  TROPONIN I (HIGH SENSITIVITY)    EKG EKG  Interpretation  Date/Time:  Sunday February 11 2022 15:41:12 EST Ventricular Rate:  82 PR Interval:  208 QRS Duration: 130 QT Interval:  368 QTC Calculation: 430 R Axis:   86 Text Interpretation: Sinus rhythm Atrial premature complexes Right bundle branch block Anteroseptal infarct, age indeterminate ST elevation, consider inferior injury  RBBB is new Confirmed by Isla Pence [phone]) on 02/11/2022 4:22:25 PM  Radiology CT Angio Chest Pulmonary Embolism (PE) W or WO Contrast  Result Date: 02/11/2022 CLINICAL DATA:  Near-syncope, pulmonary embolism suspected. EXAM: CT ANGIOGRAPHY CHEST WITH CONTRAST TECHNIQUE: Multidetector CT imaging of the chest was performed using the standard protocol during bolus administration of intravenous contrast. Multiplanar CT image reconstructions and MIPs were obtained to evaluate the vascular anatomy. RADIATION DOSE REDUCTION: This exam was performed according to the departmental dose-optimization program which includes automated exposure control, adjustment of the mA and/or kV according to patient size and/or use of iterative reconstruction technique. CONTRAST:  41m OMNIPAQUE IOHEXOL 350 MG/ML SOLN COMPARISON:  None Available. FINDINGS: Cardiovascular: Satisfactory opacification of the pulmonary arteries to the segmental level. No evidence of pulmonary embolism. Normal heart size. No pericardial effusion. Coronary artery atherosclerotic calcifications. Mediastinum/Nodes: No enlarged mediastinal, hilar, or axillary lymph nodes. Thyroid gland, trachea, and esophagus demonstrate no significant findings. Lungs/Pleura: Mild centrilobular emphysematous changes of bilateral lungs. Reticular opacities with ground-glass attenuation in the right middle and lower lobes with scattered 2-3 mm nodular opacities suggesting sequela of recent infectious/inflammatory process Upper Abdomen: No acute abnormality. Musculoskeletal: No chest wall abnormality. No acute or significant osseous  findings. Review of the MIP images confirms the above findings. IMPRESSION: 1. No CT evidence of pulmonary embolism. 2. Reticular opacities with ground-glass attenuation in the right middle and lower lobes with scattered 2-3 mm nodular opacities suggesting sequela of recent infectious/inflammatory process. 3. Mild centrilobular emphysematous changes of bilateral lungs. 4. Coronary artery atherosclerotic calcifications. Emphysema (ICD10-J43.9). Electronically Signed   By: IKeane PoliceD.O.   On: 02/11/2022 18:25   DG Chest 2 View  Result Date: 02/11/2022 CLINICAL DATA:  Syncope. EXAM: CHEST - 2 VIEW COMPARISON:  Chest radiograph dated June 11, 2021 FINDINGS: The heart size and mediastinal contours are within normal limits. Pacemaker leads in the right atrium and right ventricle. Both lungs are clear. The visualized skeletal structures are unremarkable. IMPRESSION: No active cardiopulmonary disease. Electronically Signed   By: IKeane PoliceD.O.   On: 02/11/2022 16:04    Procedures Procedures    Medications Ordered in ED Medications  sodium chloride 0.9 % bolus 1,000 mL (1,000 mLs Intravenous New Bag/Given 02/11/22 1743)    And  0.9 %  sodium chloride infusion (0 mLs Intravenous Hold 02/11/22 1743)  oxyCODONE (Oxy IR/ROXICODONE) immediate release tablet 5 mg (has no administration in time range)  oxyCODONE (Oxy IR/ROXICODONE) immediate release tablet 5 mg (5 mg Oral Given 02/11/22 1740)  iohexol (OMNIPAQUE) 350 MG/ML injection 75 mL (75 mLs Intravenous Contrast Given 02/11/22 1804)    ED Course/ Medical Decision Making/ A&P                             Medical Decision Making Amount and/or Complexity of Data Reviewed Labs: ordered. Radiology: ordered. ECG/medicine tests: ordered.  Risk Prescription drug management.   This patient presents to the ED for concern of near syncope, this involves an extensive number of treatment options, and is a complaint that carries with it a high risk of  complications and morbidity.  The differential diagnosis includes anemia, electrolyte abn, dehydration, PE   Co morbidities that complicate the patient evaluation  Crohn's disease, CHB s/p pacemaker placement, pancytopenia, dyslipidemia, gerd, htn, and kidney stones   Additional history obtained:  Additional history obtained from epic chart review External records from outside source obtained and reviewed including  family   Lab Tests:  I Ordered, and personally interpreted labs.  The pertinent results include:  cbc with hgb 10.6 (hgb 10.3 yesterday); bmp with mild hyponatremia of 130; trop nl times 2, ua neg   Imaging Studies ordered:  I ordered imaging studies including CXR and CT chest  I independently visualized and interpreted imaging which showed  CXR: No active cardiopulmonary disease.  CT chest: No CT evidence of pulmonary embolism.  2. Reticular opacities with ground-glass attenuation in the right  middle and lower lobes with scattered 2-3 mm nodular opacities  suggesting sequela of recent infectious/inflammatory process.  3. Mild centrilobular emphysematous changes of bilateral lungs.  4. Coronary artery atherosclerotic calcifications.    Emphysema (ICD10-J43.9).   I agree with the radiologist interpretation   Cardiac Monitoring:  The patient was maintained on a cardiac monitor.  I personally viewed and interpreted the cardiac monitored which showed an underlying rhythm of: nsr   Medicines ordered and prescription drug management:  I ordered medication including ivfs  for hypotension  Reevaluation of the patient after these medicines showed that the patient improved I have reviewed the patients home medicines and have made adjustments as needed   Test Considered:  ct   Critical Interventions:  ivfs  Problem List / ED Course:  RBBB:  new from 2019, but cards note from 11/23 report rbbb Near syncope:  pacemaker interrogated; no  problems Hypotension:  BP is better now.  He is able to ambulate with a walker without feeling dizzy.  Pt is to hold amlodipine and metoprolol if bp less than 100.  Pt is to return if worse.  F/u with pcp.   Reevaluation:  After the interventions noted above, I reevaluated the patient and found that they have :improved   Social Determinants of Health:  Lives at home   Dispostion:  After consideration of the diagnostic results and the patients response to treatment, I feel that the patent would benefit from discharge with outpatient f/u.          Final Clinical Impression(s) / ED Diagnoses Final diagnoses:  Near syncope  Hypotension, unspecified hypotension type    Rx / DC Orders ED Discharge Orders     None         Isla Pence, MD 02/11/22 2007

## 2022-02-12 ENCOUNTER — Encounter (HOSPITAL_COMMUNITY): Payer: Self-pay | Admitting: Orthopaedic Surgery

## 2022-02-12 ENCOUNTER — Telehealth: Payer: Self-pay | Admitting: Internal Medicine

## 2022-02-12 ENCOUNTER — Telehealth: Payer: Self-pay | Admitting: Orthopaedic Surgery

## 2022-02-12 ENCOUNTER — Other Ambulatory Visit: Payer: Self-pay | Admitting: Orthopaedic Surgery

## 2022-02-12 MED ORDER — CYCLOBENZAPRINE HCL 5 MG PO TABS: 5.0000 mg | ORAL_TABLET | Freq: Three times a day (TID) | ORAL | 0 refills | Status: DC | PRN

## 2022-02-12 NOTE — Discharge Summary (Signed)
Patient ID: Maxwell Li MRN: [id number] DOB/AGE: 08/10/1955 67 y.o.  Admit date: 02/09/2022 Discharge date: 02/12/2022  Admission Diagnoses:  Principal Problem:   Unilateral primary osteoarthritis, left hip Active Problems:   Status post total replacement of left hip   Discharge Diagnoses:  Same  Past Medical History:  Diagnosis Date   Adenomatous colon polyp    Arthritis    AV block    B12 deficiency    Crohn's disease ([place])    Diverticulosis    DYSLIPIDEMIA 06/28/2009   Qualifier: Diagnosis of  By: Loanne Drilling MD, Sean A    GERD 09/04/2006   Qualifier: Diagnosis of  By: Loanne Drilling MD, Sean A    Hepatic steatosis    History of kidney stones    Hypertension    HYPOGONADISM, MALE 05/20/2008   Qualifier: Diagnosis of  By: Loanne Drilling MD, Sean A    Internal hemorrhoids    KNEE PAIN 03/24/2007   Qualifier: Diagnosis of  By: Loanne Drilling MD, Sean A    Pneumonia    Presence of permanent cardiac pacemaker    SSS (sick sinus syndrome) ([place])    Ventral hernia     Surgeries: Procedure(s): LEFT TOTAL HIP ARTHROPLASTY ANTERIOR APPROACH on 02/09/2022   Consultants:   Discharged Condition: Improved  Hospital Course: Maxwell Li is an 67 y.o. male who was admitted 02/09/2022 for operative treatment ofUnilateral primary osteoarthritis, left hip. Patient has severe unremitting pain that affects sleep, daily activities, and work/hobbies. After pre-op clearance the patient was taken to the operating room on 02/09/2022 and underwent  Procedure(s): LEFT TOTAL HIP ARTHROPLASTY ANTERIOR APPROACH.    Patient was given perioperative antibiotics:  Anti-infectives (From admission, onward)    Start     Dose/Rate Route Frequency Ordered Stop   02/09/22 1800  ceFAZolin (ANCEF) IVPB 1 g/50 mL premix        1 g 100 mL/hr over 30 Minutes Intravenous Every 6 hours 02/09/22 1415 02/10/22 0042   02/09/22 1015  ceFAZolin (ANCEF) IVPB 2g/100 mL premix        2 g 200 mL/hr over 30 Minutes Intravenous On  call to O.R. 02/09/22 1005 02/09/22 1151        Patient was given sequential compression devices, early ambulation, and chemoprophylaxis to prevent DVT.  Patient benefited maximally from hospital stay and there were no complications.    Recent vital signs: No data found.   Recent laboratory studies:  Recent Labs    02/10/22 0328 02/11/22 1640  WBC 2.9* 4.0  HGB 10.3* 10.6*  HCT 31.1* 31.3*  PLT 115* 107*  NA 131* 130*  K 4.6 3.7  CL 93* 84*  CO2 31 33*  BUN 14 15  CREATININE 0.76 0.82  GLUCOSE 116* 121*  CALCIUM 8.1* 7.7*     Discharge Medications:   Allergies as of 02/11/2022       Reactions   Lidocaine Swelling   Throat swelling    Lovastatin Nausea Only   Shellfish Allergy Hives, Itching        Medication List     TAKE these medications    acetaminophen 650 MG CR tablet Commonly known as: TYLENOL Take 1,300 mg by mouth daily.   albuterol 108 (90 Base) MCG/ACT inhaler Commonly known as: VENTOLIN HFA Inhale 2 puffs into the lungs every 6 (six) hours as needed for wheezing or shortness of breath.   amLODipine 5 MG tablet Commonly known as: NORVASC Take 5 mg by mouth daily.   aspirin 81 MG  chewable tablet Chew 1 tablet (81 mg total) by mouth 2 (two) times daily.   cyanocobalamin 1000 MCG tablet Commonly known as: VITAMIN B12 Take 1,000 mcg by mouth daily.   cyanocobalamin 1000 MCG/ML injection Commonly known as: VITAMIN B12 Inject 1 mL (1,000 mcg total) into the skin every 30 (thirty) days. At PCP office   folic acid 416 MCG tablet Commonly known as: FOLVITE Take 800 mcg by mouth daily.   metoprolol tartrate 25 MG tablet Commonly known as: LOPRESSOR Take 1 tablet (25 mg total) by mouth 2 (two) times daily.   omeprazole 20 MG capsule Commonly known as: PRILOSEC Take 1 capsule (20 mg total) by mouth daily.   oxyCODONE 5 MG immediate release tablet Commonly known as: Oxy IR/ROXICODONE Take 1-2 tablets (5-10 mg total) by mouth every 6  (six) hours as needed for moderate pain (pain score 4-6).   predniSONE 5 MG tablet Commonly known as: DELTASONE Take 1 tablet (5 mg total) by mouth daily.   PRESCRIPTION MEDICATION Inhale into the lungs at bedtime. CPAP   Skyrizi 360 MG/2.4ML Soct Generic drug: Risankizumab-rzaa Inject 360 mg into the skin See admin instructions. Inject '360mg'$  every 8 weeks.   testosterone cypionate 200 MG/ML injection Commonly known as: DEPOTESTOSTERONE CYPIONATE Inject 1 mL (200 mg total) into the muscle every 14 (fourteen) days.   Vitamin D-3 125 MCG (5000 UT) Tabs Take 1 tablet by mouth daily at 2 PM.        Diagnostic Studies: CT Angio Chest Pulmonary Embolism (PE) W or WO Contrast  Result Date: 02/11/2022 CLINICAL DATA:  Near-syncope, pulmonary embolism suspected. EXAM: CT ANGIOGRAPHY CHEST WITH CONTRAST TECHNIQUE: Multidetector CT imaging of the chest was performed using the standard protocol during bolus administration of intravenous contrast. Multiplanar CT image reconstructions and MIPs were obtained to evaluate the vascular anatomy. RADIATION DOSE REDUCTION: This exam was performed according to the departmental dose-optimization program which includes automated exposure control, adjustment of the mA and/or kV according to patient size and/or use of iterative reconstruction technique. CONTRAST:  16m OMNIPAQUE IOHEXOL 350 MG/ML SOLN COMPARISON:  None Available. FINDINGS: Cardiovascular: Satisfactory opacification of the pulmonary arteries to the segmental level. No evidence of pulmonary embolism. Normal heart size. No pericardial effusion. Coronary artery atherosclerotic calcifications. Mediastinum/Nodes: No enlarged mediastinal, hilar, or axillary lymph nodes. Thyroid gland, trachea, and esophagus demonstrate no significant findings. Lungs/Pleura: Mild centrilobular emphysematous changes of bilateral lungs. Reticular opacities with ground-glass attenuation in the right middle and lower lobes with  scattered 2-3 mm nodular opacities suggesting sequela of recent infectious/inflammatory process Upper Abdomen: No acute abnormality. Musculoskeletal: No chest wall abnormality. No acute or significant osseous findings. Review of the MIP images confirms the above findings. IMPRESSION: 1. No CT evidence of pulmonary embolism. 2. Reticular opacities with ground-glass attenuation in the right middle and lower lobes with scattered 2-3 mm nodular opacities suggesting sequela of recent infectious/inflammatory process. 3. Mild centrilobular emphysematous changes of bilateral lungs. 4. Coronary artery atherosclerotic calcifications. Emphysema (ICD10-J43.9). Electronically Signed   By: IKeane PoliceD.O.   On: 02/11/2022 18:25   DG Chest 2 View  Result Date: 02/11/2022 CLINICAL DATA:  Syncope. EXAM: CHEST - 2 VIEW COMPARISON:  Chest radiograph dated June 11, 2021 FINDINGS: The heart size and mediastinal contours are within normal limits. Pacemaker leads in the right atrium and right ventricle. Both lungs are clear. The visualized skeletal structures are unremarkable. IMPRESSION: No active cardiopulmonary disease. Electronically Signed   By: IKeane PoliceD.O.   On:  02/11/2022 16:04   DG Pelvis Portable  Result Date: 02/09/2022 CLINICAL DATA:  Left hip arthroplasty EXAM: PORTABLE PELVIS 1 VIEWS COMPARISON:  10/17/21 hip radiograph FINDINGS: Expected postsurgical changes from a left hip arthroplasty including and soft tissue gas surrounding the left hip joint. Skin staples overlie the left hip. No acute hardware complications. Degenerative changes in the lower lumbar spine. Nonobstructive bowel gas pattern. Right hip joint is normal in appearance IMPRESSION: Expected postsurgical changes from a left hip arthroplasty. Electronically Signed   By: Marin Roberts M.D.   On: 02/09/2022 13:35   DG HIP UNILAT WITH PELVIS 2-3 VIEWS LEFT  Result Date: 02/09/2022 CLINICAL DATA:  Surgery, elective EXAM: DG HIP (WITH OR WITHOUT  PELVIS) 2-3V LEFT COMPARISON:  Radiograph 10/17/2021 FINDINGS: Intraoperative images during left hip arthroplasty. Intact hardware. Alignment is normal. No evidence of immediate complication. IMPRESSION: Intraoperative images during left hip arthroplasty. Normal alignment without evidence of immediate complication. Electronically Signed   By: Maurine Simmering M.D.   On: 02/09/2022 13:03   DG C-Arm 1-60 Min-No Report  Result Date: 02/09/2022 Fluoroscopy was utilized by the requesting physician.  No radiographic interpretation.   DG C-Arm 1-60 Min-No Report  Result Date: 02/09/2022 Fluoroscopy was utilized by the requesting physician.  No radiographic interpretation.    Disposition: Discharge disposition: 01-Home or Self Care       Discharge Instructions     Call MD / Call 911   Complete by: As directed    If you experience chest pain or shortness of breath, CALL 911 and be transported to the hospital emergency room.  If you develope a fever above 101 F, pus (white drainage) or increased drainage or redness at the wound, or calf pain, call your surgeon's office.   Constipation Prevention   Complete by: As directed    Drink plenty of fluids.  Prune juice may be helpful.  You may use a stool softener, such as Colace (over the counter) 100 mg twice a day.  Use MiraLax (over the counter) for constipation as needed.   Diet - low sodium heart healthy   Complete by: As directed    Increase activity slowly as tolerated   Complete by: As directed    Post-operative opioid taper instructions:   Complete by: As directed    POST-OPERATIVE OPIOID TAPER INSTRUCTIONS: It is important to wean off of your opioid medication as soon as possible. If you do not need pain medication after your surgery it is ok to stop day one. Opioids include: Codeine, Hydrocodone(Norco, Vicodin), Oxycodone(Percocet, oxycontin) and hydromorphone amongst others.  Long term and even short term use of opiods can cause: Increased  pain response Dependence Constipation Depression Respiratory depression And more.  Withdrawal symptoms can include Flu like symptoms Nausea, vomiting And more Techniques to manage these symptoms Hydrate well Eat regular healthy meals Stay active Use relaxation techniques(deep breathing, meditating, yoga) Do Not substitute Alcohol to help with tapering If you have been on opioids for less than two weeks and do not have pain than it is ok to stop all together.  Plan to wean off of opioids This plan should start within one week post op of your joint replacement. Maintain the same interval or time between taking each dose and first decrease the dose.  Cut the total daily intake of opioids by one tablet each day Next start to increase the time between doses. The last dose that should be eliminated is the evening dose.  Follow-up Information     [facility] Follow up.   Specialty: Home Health Services Why: [facility] will provide PT in the home after discharge. Contact information: [place] [address] [phone]         Mcarthur Rossetti, MD Follow up in 2 week(s).   Specialty: Orthopedic Surgery Contact information: [address] [place] [address] [phone]                  Signed: Erskine Emery 02/12/2022, 1:05 PM

## 2022-02-12 NOTE — Telephone Encounter (Signed)
Inbound call from [place] she is a Architectural technologist with skyrizi , requesting to speak with a nurse if you can please call her at [phone]..Thanks

## 2022-02-12 NOTE — Telephone Encounter (Signed)
Patient states the pharmacy called and advise there was a problem with the muscle relaxer and they states it was written for 1 table. Patient states he wanted to know if it was supposed to get more. He did receive the pain medication. Please advise. [facility]

## 2022-02-13 ENCOUNTER — Other Ambulatory Visit: Payer: Self-pay

## 2022-02-13 MED ORDER — CYCLOBENZAPRINE HCL 5 MG PO TABS: 5.0000 mg | ORAL_TABLET | Freq: Three times a day (TID) | ORAL | 0 refills | Status: AC | PRN

## 2022-02-13 NOTE — Telephone Encounter (Signed)
Spoke with pt and he got his first OBI of skyrizi last month.

## 2022-02-22 ENCOUNTER — Ambulatory Visit (INDEPENDENT_AMBULATORY_CARE_PROVIDER_SITE_OTHER): Payer: Medicare HMO | Admitting: Orthopaedic Surgery

## 2022-02-22 ENCOUNTER — Encounter: Payer: Self-pay | Admitting: Orthopaedic Surgery

## 2022-02-22 DIAGNOSIS — Z96642 Presence of left artificial hip joint: Secondary | ICD-10-CM

## 2022-02-22 MED ORDER — OXYCODONE HCL 5 MG PO TABS: 5.0000 mg | ORAL_TABLET | Freq: Four times a day (QID) | ORAL | 0 refills | Status: AC | PRN

## 2022-02-22 NOTE — Progress Notes (Signed)
The patient is here for his first postoperative visit status post a left total hip arthroplasty.  He said he is doing well.  He is ambulate with a cane.  He reports good range of motion and strength.  His left hip incision looks good.  The staples are removed and Steri-Strips applied.  There is swelling to be expected but no significant seroma.  His calf is soft.  Has been compliant with his aspirin.  He will continue to increase his activities as comfort allows.  I will refill his oxycodone.  All questions and concerns were addressed and answered.  Will see him back in 4 weeks for repeat exam but no x-rays are needed.

## 2022-03-01 ENCOUNTER — Encounter: Payer: Medicare HMO | Admitting: Orthopaedic Surgery

## 2022-03-15 ENCOUNTER — Encounter: Payer: Self-pay | Admitting: Radiology

## 2022-03-21 ENCOUNTER — Ambulatory Visit: Payer: Medicare HMO | Attending: Cardiology

## 2022-03-21 DIAGNOSIS — I495 Sick sinus syndrome: Secondary | ICD-10-CM

## 2022-03-23 LAB — CUP PACEART REMOTE DEVICE CHECK
Battery Remaining Longevity: 55 mo
Battery Remaining Percentage: 45 %
Battery Voltage: 2.99 V
Brady Statistic AP VP Percent: 1 %
Brady Statistic AP VS Percent: 2 %
Brady Statistic AS VP Percent: 4.4 %
Brady Statistic AS VS Percent: 93 %
Brady Statistic RA Percent Paced: 2.2 %
Brady Statistic RV Percent Paced: 4.6 %
Date Time Interrogation Session: 20240313035221
Implantable Lead Connection Status: 753985
Implantable Lead Connection Status: 753985
Implantable Lead Implant Date: 20170601
Implantable Lead Implant Date: 20170601
Implantable Lead Location: 753859
Implantable Lead Location: 753860
Implantable Pulse Generator Implant Date: 20170601
Lead Channel Impedance Value: 480 Ohm
Lead Channel Impedance Value: 490 Ohm
Lead Channel Pacing Threshold Amplitude: 0.625 V
Lead Channel Pacing Threshold Amplitude: 0.75 V
Lead Channel Pacing Threshold Pulse Width: 0.5 ms
Lead Channel Pacing Threshold Pulse Width: 0.5 ms
Lead Channel Sensing Intrinsic Amplitude: 2.5 mV
Lead Channel Sensing Intrinsic Amplitude: 8.3 mV
Lead Channel Setting Pacing Amplitude: 1.625
Lead Channel Setting Pacing Amplitude: 2.5 V
Lead Channel Setting Pacing Pulse Width: 0.5 ms
Lead Channel Setting Sensing Sensitivity: 2 mV
Pulse Gen Model: 2272
Pulse Gen Serial Number: 7904591

## 2022-03-26 ENCOUNTER — Ambulatory Visit (INDEPENDENT_AMBULATORY_CARE_PROVIDER_SITE_OTHER): Payer: Medicare HMO | Admitting: Orthopaedic Surgery

## 2022-03-26 ENCOUNTER — Encounter: Payer: Self-pay | Admitting: Orthopaedic Surgery

## 2022-03-26 DIAGNOSIS — Z96642 Presence of left artificial hip joint: Secondary | ICD-10-CM

## 2022-03-26 NOTE — Progress Notes (Signed)
The patient is now 6 weeks status post a left total hip arthroplasty.  He said he is doing great and is walking without a cane and having no pain at all.  He says his posture is also better.  On exam his left operative hip moves smoothly and fluidly knee does look better overall.  He gets out of a chair easily and walks without a limp.  From my standpoint I do not need to see him back for 6 months unless there is issues.  Will have a standing low AP pelvis and lateral of his left hip at that visit.

## 2022-04-26 NOTE — Progress Notes (Signed)
Remote pacemaker transmission.   

## 2022-06-20 ENCOUNTER — Ambulatory Visit (INDEPENDENT_AMBULATORY_CARE_PROVIDER_SITE_OTHER): Payer: Medicare HMO

## 2022-06-20 DIAGNOSIS — I495 Sick sinus syndrome: Secondary | ICD-10-CM

## 2022-06-20 LAB — CUP PACEART REMOTE DEVICE CHECK
Battery Remaining Longevity: 52 mo
Battery Remaining Percentage: 43 %
Battery Voltage: 2.99 V
Brady Statistic AP VP Percent: 1 %
Brady Statistic AP VS Percent: 1.4 %
Brady Statistic AS VP Percent: 2.9 %
Brady Statistic AS VS Percent: 95 %
Brady Statistic RA Percent Paced: 1.7 %
Brady Statistic RV Percent Paced: 3.3 %
Date Time Interrogation Session: 20240612020015
Implantable Lead Connection Status: 753985
Implantable Lead Connection Status: 753985
Implantable Lead Implant Date: 20170601
Implantable Lead Implant Date: 20170601
Implantable Lead Location: 753859
Implantable Lead Location: 753860
Implantable Pulse Generator Implant Date: 20170601
Lead Channel Impedance Value: 450 Ohm
Lead Channel Impedance Value: 460 Ohm
Lead Channel Pacing Threshold Amplitude: 0.625 V
Lead Channel Pacing Threshold Amplitude: 0.75 V
Lead Channel Pacing Threshold Pulse Width: 0.5 ms
Lead Channel Pacing Threshold Pulse Width: 0.5 ms
Lead Channel Sensing Intrinsic Amplitude: 3 mV
Lead Channel Sensing Intrinsic Amplitude: 6.7 mV
Lead Channel Setting Pacing Amplitude: 1.625
Lead Channel Setting Pacing Amplitude: 2.5 V
Lead Channel Setting Pacing Pulse Width: 0.5 ms
Lead Channel Setting Sensing Sensitivity: 2 mV
Pulse Gen Model: 2272
Pulse Gen Serial Number: 7904591

## 2022-06-26 ENCOUNTER — Telehealth: Payer: Self-pay | Admitting: Internal Medicine

## 2022-06-26 NOTE — Telephone Encounter (Signed)
Pt calling stating he has not received his skyrizi since his first dose. Pt was approved for patient assistance. Will contact [employer] assist to see why he has not been getting the medication.

## 2022-06-26 NOTE — Telephone Encounter (Signed)
PT is calling to discuss [employer] He hasn't received refills and thought he would get a continous prescription. Please advise.

## 2022-06-28 NOTE — Telephone Encounter (Signed)
Spoke with [employer] assist and pt should have called to schedule shipment of his medication 2 weeks prior to his next dose. Scheduled shipment for 6/25, pt aware and knows he will have to sign for med. Pt states he did not know he had to call to get the med. Pt given the phone number [phone], option 1 to call 2 weeks prior to his next dose to request shipment of the drug. Pt verbalized understanding.

## 2022-07-05 IMAGING — CR DG ABDOMEN 2V
2 series · 2 of 2 positions shown · non-contrast
Comparison: Radiograph June 11, 2021

CLINICAL DATA: Crohn's colitis.

EXAM:
ABDOMEN - 2 VIEW

[abdomen erect]
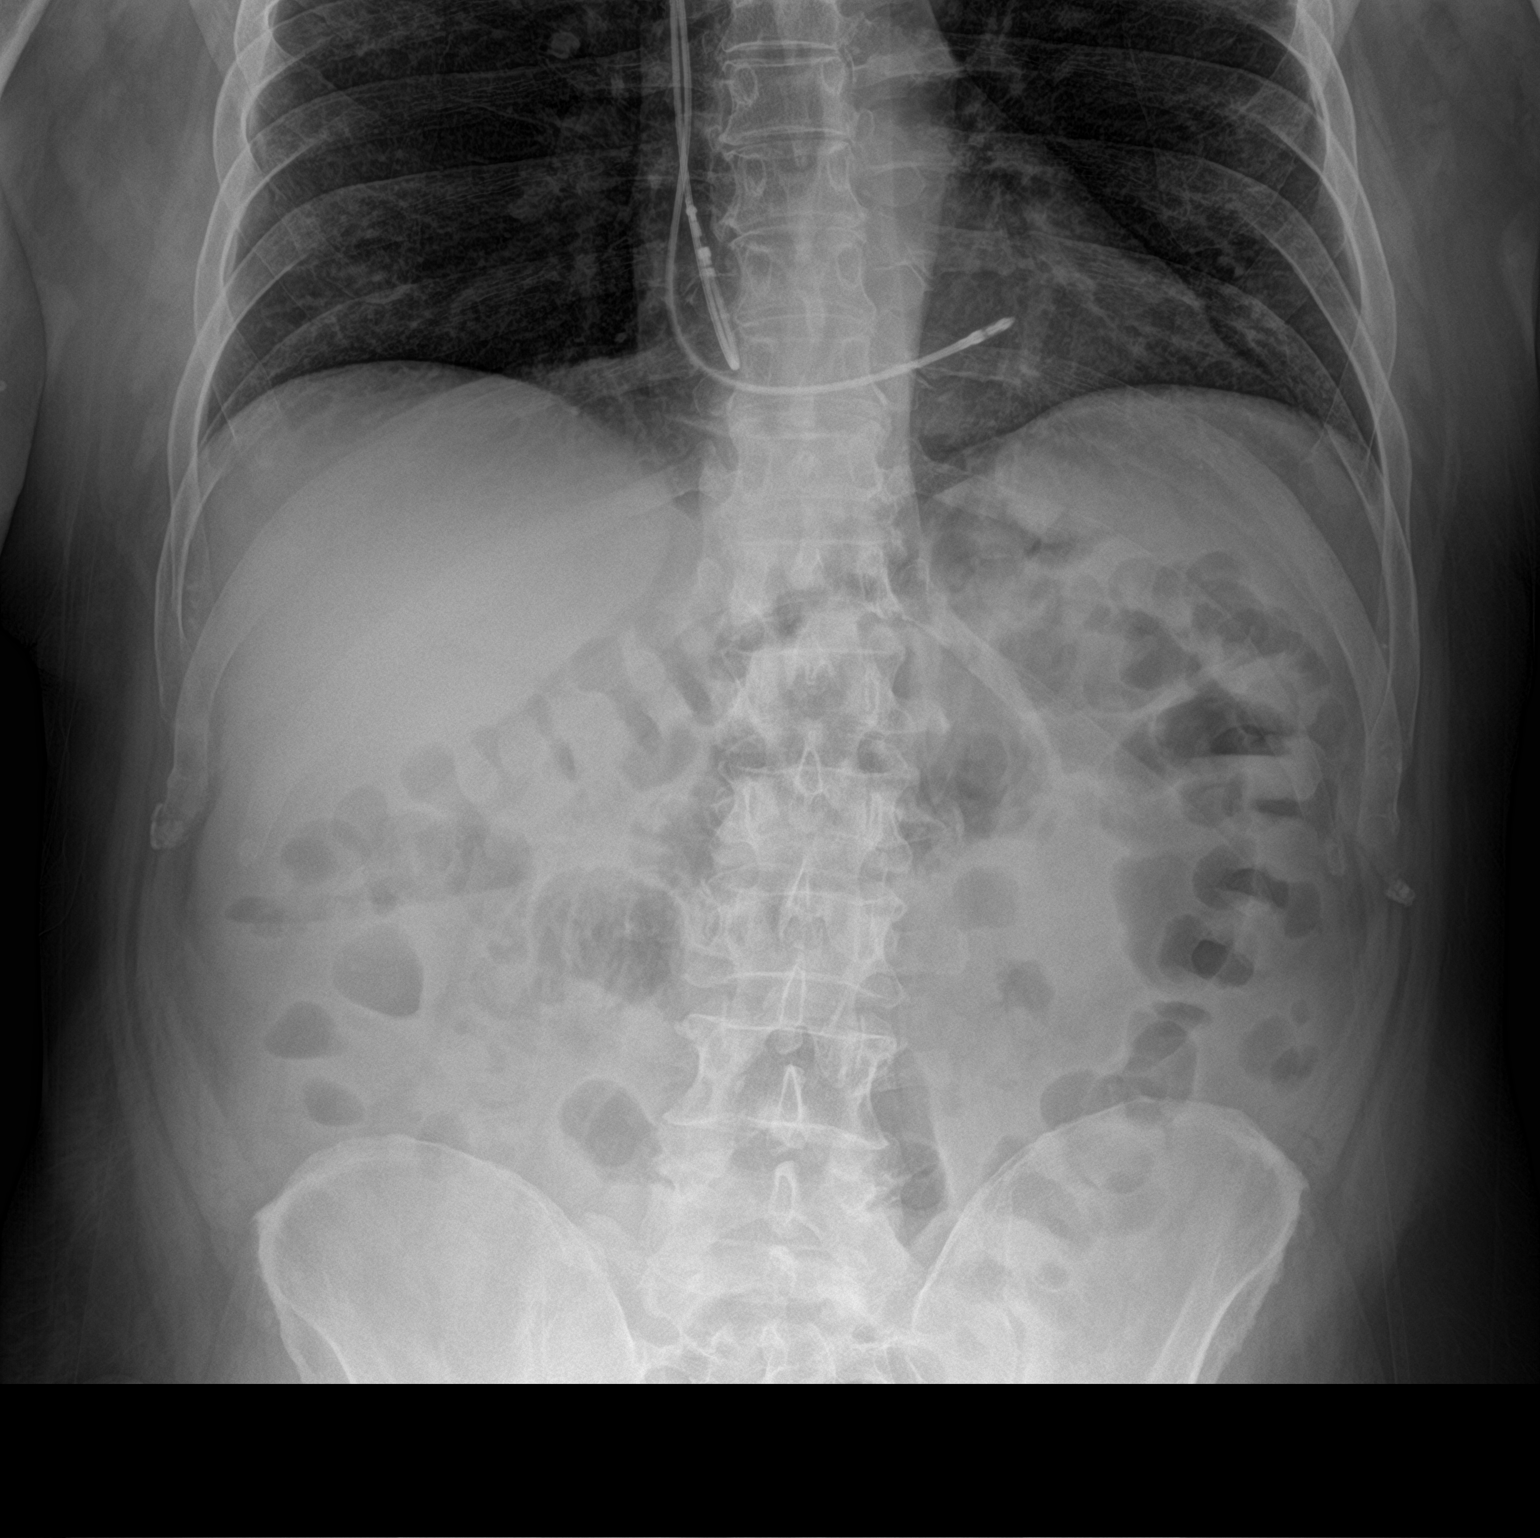

[abdomen supine]
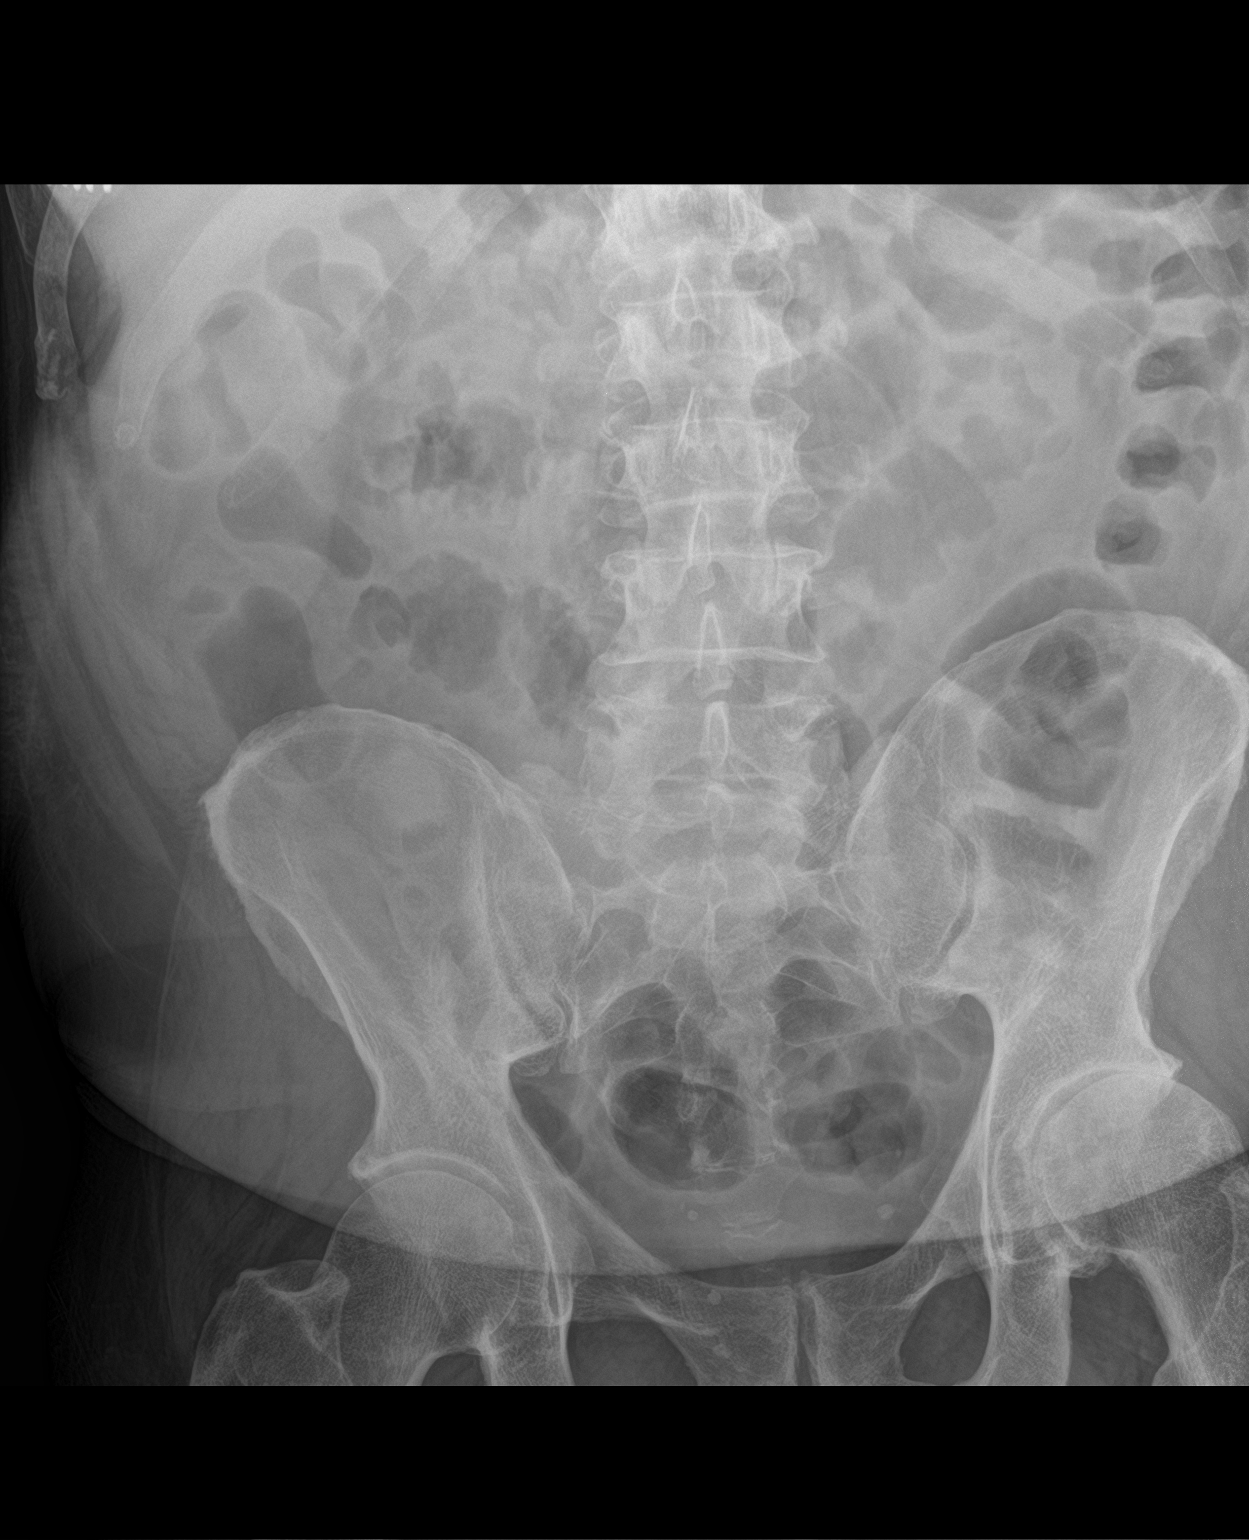

[2 of 2 positions shown; findings below may reference images not displayed]

FINDINGS: The bowel gas pattern is normal. Prominence of the colonic haustral
folds. There is no evidence of free air. Partially visualized
intracardiac leads project over the right ventricle and right
atrium.
IMPRESSION: Prominence of the colonic haustral folds may reflect colitis. No
evidence of bowel obstruction.

## 2022-07-16 NOTE — Progress Notes (Signed)
Remote pacemaker transmission.   

## 2022-07-21 ENCOUNTER — Other Ambulatory Visit: Payer: Self-pay | Admitting: Nurse Practitioner

## 2022-09-19 ENCOUNTER — Ambulatory Visit (INDEPENDENT_AMBULATORY_CARE_PROVIDER_SITE_OTHER): Payer: Medicare HMO

## 2022-09-19 DIAGNOSIS — I442 Atrioventricular block, complete: Secondary | ICD-10-CM | POA: Diagnosis not present

## 2022-09-19 LAB — CUP PACEART REMOTE DEVICE CHECK
Battery Remaining Longevity: 50 mo
Battery Remaining Percentage: 41 %
Battery Voltage: 2.98 V
Brady Statistic AP VP Percent: 1 %
Brady Statistic AP VS Percent: 1.1 %
Brady Statistic AS VP Percent: 2.7 %
Brady Statistic AS VS Percent: 96 %
Brady Statistic RA Percent Paced: 1.3 %
Brady Statistic RV Percent Paced: 2.9 %
Date Time Interrogation Session: 20240911020014
Implantable Lead Connection Status: 753985
Implantable Lead Connection Status: 753985
Implantable Lead Implant Date: 20170601
Implantable Lead Implant Date: 20170601
Implantable Lead Location: 753859
Implantable Lead Location: 753860
Implantable Pulse Generator Implant Date: 20170601
Lead Channel Impedance Value: 440 Ohm
Lead Channel Impedance Value: 480 Ohm
Lead Channel Pacing Threshold Amplitude: 0.75 V
Lead Channel Pacing Threshold Amplitude: 0.75 V
Lead Channel Pacing Threshold Pulse Width: 0.5 ms
Lead Channel Pacing Threshold Pulse Width: 0.5 ms
Lead Channel Sensing Intrinsic Amplitude: 4.5 mV
Lead Channel Sensing Intrinsic Amplitude: 7.3 mV
Lead Channel Setting Pacing Amplitude: 1.75 V
Lead Channel Setting Pacing Amplitude: 2.5 V
Lead Channel Setting Pacing Pulse Width: 0.5 ms
Lead Channel Setting Sensing Sensitivity: 2 mV
Pulse Gen Model: 2272
Pulse Gen Serial Number: 7904591

## 2022-09-26 ENCOUNTER — Ambulatory Visit: Payer: Medicare HMO | Admitting: Orthopaedic Surgery

## 2022-10-05 NOTE — Progress Notes (Signed)
Remote pacemaker transmission.   

## 2022-12-19 ENCOUNTER — Ambulatory Visit (INDEPENDENT_AMBULATORY_CARE_PROVIDER_SITE_OTHER): Payer: Medicare HMO

## 2022-12-19 DIAGNOSIS — I442 Atrioventricular block, complete: Secondary | ICD-10-CM

## 2022-12-19 LAB — CUP PACEART REMOTE DEVICE CHECK
Battery Remaining Longevity: 47 mo
Battery Remaining Percentage: 39 %
Battery Voltage: 2.98 V
Brady Statistic AP VP Percent: 1 %
Brady Statistic AP VS Percent: 1 %
Brady Statistic AS VP Percent: 2.4 %
Brady Statistic AS VS Percent: 96 %
Brady Statistic RA Percent Paced: 1.4 %
Brady Statistic RV Percent Paced: 2.9 %
Date Time Interrogation Session: 20241211020023
Implantable Lead Connection Status: 753985
Implantable Lead Connection Status: 753985
Implantable Lead Implant Date: 20170601
Implantable Lead Implant Date: 20170601
Implantable Lead Location: 753859
Implantable Lead Location: 753860
Implantable Pulse Generator Implant Date: 20170601
Lead Channel Impedance Value: 430 Ohm
Lead Channel Impedance Value: 480 Ohm
Lead Channel Pacing Threshold Amplitude: 0.625 V
Lead Channel Pacing Threshold Amplitude: 0.75 V
Lead Channel Pacing Threshold Pulse Width: 0.5 ms
Lead Channel Pacing Threshold Pulse Width: 0.5 ms
Lead Channel Sensing Intrinsic Amplitude: 3.7 mV
Lead Channel Sensing Intrinsic Amplitude: 7 mV
Lead Channel Setting Pacing Amplitude: 1.625
Lead Channel Setting Pacing Amplitude: 2.5 V
Lead Channel Setting Pacing Pulse Width: 0.5 ms
Lead Channel Setting Sensing Sensitivity: 2 mV
Pulse Gen Model: 2272
Pulse Gen Serial Number: 7904591

## 2023-01-25 NOTE — Progress Notes (Signed)
Remote pacemaker transmission.   

## 2023-03-20 ENCOUNTER — Ambulatory Visit: Payer: Medicare HMO

## 2023-03-20 DIAGNOSIS — I495 Sick sinus syndrome: Secondary | ICD-10-CM | POA: Diagnosis not present

## 2023-03-20 LAB — CUP PACEART REMOTE DEVICE CHECK
Battery Remaining Longevity: 44 mo
Battery Remaining Percentage: 37 %
Battery Voltage: 2.98 V
Brady Statistic AP VP Percent: 1 %
Brady Statistic AP VS Percent: 1 %
Brady Statistic AS VP Percent: 2.3 %
Brady Statistic AS VS Percent: 96 %
Brady Statistic RA Percent Paced: 1.5 %
Brady Statistic RV Percent Paced: 3 %
Date Time Interrogation Session: 20250312061426
Implantable Lead Connection Status: 753985
Implantable Lead Connection Status: 753985
Implantable Lead Implant Date: 20170601
Implantable Lead Implant Date: 20170601
Implantable Lead Location: 753859
Implantable Lead Location: 753860
Implantable Pulse Generator Implant Date: 20170601
Lead Channel Impedance Value: 440 Ohm
Lead Channel Impedance Value: 460 Ohm
Lead Channel Pacing Threshold Amplitude: 0.75 V
Lead Channel Pacing Threshold Amplitude: 0.75 V
Lead Channel Pacing Threshold Pulse Width: 0.5 ms
Lead Channel Pacing Threshold Pulse Width: 0.5 ms
Lead Channel Sensing Intrinsic Amplitude: 3.6 mV
Lead Channel Sensing Intrinsic Amplitude: 7 mV
Lead Channel Setting Pacing Amplitude: 1.75 V
Lead Channel Setting Pacing Amplitude: 2.5 V
Lead Channel Setting Pacing Pulse Width: 0.5 ms
Lead Channel Setting Sensing Sensitivity: 2 mV
Pulse Gen Model: 2272
Pulse Gen Serial Number: 7904591

## 2023-05-06 NOTE — Progress Notes (Signed)
 Remote pacemaker transmission.

## 2023-06-19 ENCOUNTER — Ambulatory Visit: Payer: Medicare HMO

## 2023-06-19 DIAGNOSIS — I495 Sick sinus syndrome: Secondary | ICD-10-CM

## 2023-06-19 LAB — CUP PACEART REMOTE DEVICE CHECK
Battery Remaining Longevity: 43 mo
Battery Remaining Percentage: 35 %
Battery Voltage: 2.96 V
Brady Statistic AP VP Percent: 1 %
Brady Statistic AP VS Percent: 1 %
Brady Statistic AS VP Percent: 2.2 %
Brady Statistic AS VS Percent: 96 %
Brady Statistic RA Percent Paced: 1.3 %
Brady Statistic RV Percent Paced: 2.8 %
Date Time Interrogation Session: 20250611020015
Implantable Lead Connection Status: 753985
Implantable Lead Connection Status: 753985
Implantable Lead Implant Date: 20170601
Implantable Lead Implant Date: 20170601
Implantable Lead Location: 753859
Implantable Lead Location: 753860
Implantable Pulse Generator Implant Date: 20170601
Lead Channel Impedance Value: 460 Ohm
Lead Channel Impedance Value: 530 Ohm
Lead Channel Pacing Threshold Amplitude: 0.625 V
Lead Channel Pacing Threshold Amplitude: 0.75 V
Lead Channel Pacing Threshold Pulse Width: 0.5 ms
Lead Channel Pacing Threshold Pulse Width: 0.5 ms
Lead Channel Sensing Intrinsic Amplitude: 5 mV
Lead Channel Sensing Intrinsic Amplitude: 9 mV
Lead Channel Setting Pacing Amplitude: 1.625
Lead Channel Setting Pacing Amplitude: 2.5 V
Lead Channel Setting Pacing Pulse Width: 0.5 ms
Lead Channel Setting Sensing Sensitivity: 2 mV
Pulse Gen Model: 2272
Pulse Gen Serial Number: 7904591

## 2023-06-25 ENCOUNTER — Ambulatory Visit: Payer: Self-pay | Admitting: Cardiology

## 2023-08-15 NOTE — Progress Notes (Signed)
 Remote pacemaker transmission.

## 2023-09-18 ENCOUNTER — Ambulatory Visit: Payer: Medicare HMO

## 2023-09-18 DIAGNOSIS — I495 Sick sinus syndrome: Secondary | ICD-10-CM

## 2023-09-19 ENCOUNTER — Ambulatory Visit: Payer: Self-pay | Admitting: Cardiology

## 2023-09-19 LAB — CUP PACEART REMOTE DEVICE CHECK
Battery Remaining Longevity: 40 mo
Battery Remaining Percentage: 32 %
Battery Voltage: 2.96 V
Brady Statistic AP VP Percent: 1 %
Brady Statistic AP VS Percent: 1 %
Brady Statistic AS VP Percent: 2.2 %
Brady Statistic AS VS Percent: 96 %
Brady Statistic RA Percent Paced: 1.3 %
Brady Statistic RV Percent Paced: 2.8 %
Date Time Interrogation Session: 20250910020015
Implantable Lead Connection Status: 753985
Implantable Lead Connection Status: 753985
Implantable Lead Implant Date: 20170601
Implantable Lead Implant Date: 20170601
Implantable Lead Location: 753859
Implantable Lead Location: 753860
Implantable Pulse Generator Implant Date: 20170601
Lead Channel Impedance Value: 440 Ohm
Lead Channel Impedance Value: 510 Ohm
Lead Channel Pacing Threshold Amplitude: 0.75 V
Lead Channel Pacing Threshold Amplitude: 0.75 V
Lead Channel Pacing Threshold Pulse Width: 0.5 ms
Lead Channel Pacing Threshold Pulse Width: 0.5 ms
Lead Channel Sensing Intrinsic Amplitude: 4 mV
Lead Channel Sensing Intrinsic Amplitude: 7.2 mV
Lead Channel Setting Pacing Amplitude: 1.75 V
Lead Channel Setting Pacing Amplitude: 2.5 V
Lead Channel Setting Pacing Pulse Width: 0.5 ms
Lead Channel Setting Sensing Sensitivity: 2 mV
Pulse Gen Model: 2272
Pulse Gen Serial Number: 7904591

## 2023-09-27 NOTE — Progress Notes (Signed)
 Remote PPM Transmission

## 2023-12-18 ENCOUNTER — Ambulatory Visit: Payer: Medicare HMO

## 2023-12-19 LAB — CUP PACEART REMOTE DEVICE CHECK
Battery Remaining Longevity: 38 mo
Battery Remaining Percentage: 30 %
Battery Voltage: 2.96 V
Brady Statistic AP VP Percent: 1 %
Brady Statistic AP VS Percent: 1 %
Brady Statistic AS VP Percent: 2.2 %
Brady Statistic AS VS Percent: 96 %
Brady Statistic RA Percent Paced: 1.2 %
Brady Statistic RV Percent Paced: 2.7 %
Date Time Interrogation Session: 20251210035109
Implantable Lead Connection Status: 753985
Implantable Lead Connection Status: 753985
Implantable Lead Implant Date: 20170601
Implantable Lead Implant Date: 20170601
Implantable Lead Location: 753859
Implantable Lead Location: 753860
Implantable Pulse Generator Implant Date: 20170601
Lead Channel Impedance Value: 450 Ohm
Lead Channel Impedance Value: 490 Ohm
Lead Channel Pacing Threshold Amplitude: 0.625 V
Lead Channel Pacing Threshold Amplitude: 0.75 V
Lead Channel Pacing Threshold Pulse Width: 0.5 ms
Lead Channel Pacing Threshold Pulse Width: 0.5 ms
Lead Channel Sensing Intrinsic Amplitude: 4.4 mV
Lead Channel Sensing Intrinsic Amplitude: 9.3 mV
Lead Channel Setting Pacing Amplitude: 1.625
Lead Channel Setting Pacing Amplitude: 2.5 V
Lead Channel Setting Pacing Pulse Width: 0.5 ms
Lead Channel Setting Sensing Sensitivity: 2 mV
Pulse Gen Model: 2272
Pulse Gen Serial Number: 7904591

## 2023-12-23 ENCOUNTER — Ambulatory Visit: Payer: Self-pay | Admitting: Cardiology

## 2023-12-25 NOTE — Progress Notes (Signed)
 Remote PPM Transmission

## 2024-03-18 ENCOUNTER — Ambulatory Visit

## 2024-06-17 ENCOUNTER — Ambulatory Visit

## 2024-09-16 ENCOUNTER — Ambulatory Visit

## 2024-12-16 ENCOUNTER — Ambulatory Visit

## 2025-03-17 ENCOUNTER — Ambulatory Visit
